# Patient Record
Sex: Female | Born: 1955 | Race: White | Hispanic: No | Marital: Married | Smoking: Never smoker
Health system: Southern US, Community
[De-identification: ages and names within clinical notes are randomized; demographics above are authoritative.]

## PROBLEM LIST (undated history)

## (undated) DIAGNOSIS — R7303 Prediabetes: Secondary | ICD-10-CM

## (undated) DIAGNOSIS — Z87442 Personal history of urinary calculi: Secondary | ICD-10-CM

## (undated) DIAGNOSIS — G4730 Sleep apnea, unspecified: Secondary | ICD-10-CM

## (undated) DIAGNOSIS — E785 Hyperlipidemia, unspecified: Secondary | ICD-10-CM

## (undated) DIAGNOSIS — Z9889 Other specified postprocedural states: Secondary | ICD-10-CM

## (undated) DIAGNOSIS — J302 Other seasonal allergic rhinitis: Secondary | ICD-10-CM

## (undated) DIAGNOSIS — M1990 Unspecified osteoarthritis, unspecified site: Secondary | ICD-10-CM

## (undated) DIAGNOSIS — G4733 Obstructive sleep apnea (adult) (pediatric): Secondary | ICD-10-CM

## (undated) DIAGNOSIS — Z789 Other specified health status: Secondary | ICD-10-CM

## (undated) DIAGNOSIS — R519 Headache, unspecified: Secondary | ICD-10-CM

## (undated) DIAGNOSIS — H9193 Unspecified hearing loss, bilateral: Secondary | ICD-10-CM

## (undated) DIAGNOSIS — H9190 Unspecified hearing loss, unspecified ear: Secondary | ICD-10-CM

## (undated) DIAGNOSIS — B0689 Other rubella complications: Secondary | ICD-10-CM

## (undated) DIAGNOSIS — Z973 Presence of spectacles and contact lenses: Secondary | ICD-10-CM

## (undated) DIAGNOSIS — J45909 Unspecified asthma, uncomplicated: Secondary | ICD-10-CM

## (undated) DIAGNOSIS — T8859XA Other complications of anesthesia, initial encounter: Secondary | ICD-10-CM

## (undated) DIAGNOSIS — F419 Anxiety disorder, unspecified: Secondary | ICD-10-CM

## (undated) DIAGNOSIS — R339 Retention of urine, unspecified: Secondary | ICD-10-CM

## (undated) DIAGNOSIS — J4520 Mild intermittent asthma, uncomplicated: Secondary | ICD-10-CM

## (undated) DIAGNOSIS — R112 Nausea with vomiting, unspecified: Secondary | ICD-10-CM

## (undated) DIAGNOSIS — M8580 Other specified disorders of bone density and structure, unspecified site: Secondary | ICD-10-CM

## (undated) DIAGNOSIS — Z9989 Dependence on other enabling machines and devices: Secondary | ICD-10-CM

## (undated) HISTORY — DX: Hyperlipidemia, unspecified: E78.5

## (undated) HISTORY — PX: OTHER SURGICAL HISTORY: SHX169

## (undated) HISTORY — PX: ABDOMINAL HYSTERECTOMY: SHX81

## (undated) HISTORY — DX: Unspecified osteoarthritis, unspecified site: M19.90

## (undated) HISTORY — DX: Other specified disorders of bone density and structure, unspecified site: M85.80

---

## 2013-12-13 ENCOUNTER — Ambulatory Visit (INDEPENDENT_AMBULATORY_CARE_PROVIDER_SITE_OTHER): Payer: BLUE CROSS/BLUE SHIELD | Admitting: Physician Assistant

## 2013-12-13 ENCOUNTER — Encounter: Payer: Self-pay | Admitting: Physician Assistant

## 2013-12-13 VITALS — BP 149/85 | HR 97 | Ht 65.0 in | Wt 173.0 lb

## 2013-12-13 DIAGNOSIS — R519 Headache, unspecified: Secondary | ICD-10-CM

## 2013-12-13 DIAGNOSIS — R7301 Impaired fasting glucose: Secondary | ICD-10-CM

## 2013-12-13 DIAGNOSIS — I10 Essential (primary) hypertension: Secondary | ICD-10-CM

## 2013-12-13 DIAGNOSIS — E785 Hyperlipidemia, unspecified: Secondary | ICD-10-CM | POA: Diagnosis not present

## 2013-12-13 DIAGNOSIS — M19049 Primary osteoarthritis, unspecified hand: Secondary | ICD-10-CM | POA: Diagnosis not present

## 2013-12-13 DIAGNOSIS — R062 Wheezing: Secondary | ICD-10-CM

## 2013-12-13 DIAGNOSIS — R51 Headache: Secondary | ICD-10-CM

## 2013-12-13 DIAGNOSIS — Z8709 Personal history of other diseases of the respiratory system: Secondary | ICD-10-CM

## 2013-12-13 DIAGNOSIS — Z131 Encounter for screening for diabetes mellitus: Secondary | ICD-10-CM

## 2013-12-13 DIAGNOSIS — Z1239 Encounter for other screening for malignant neoplasm of breast: Secondary | ICD-10-CM

## 2013-12-13 MED ORDER — MELOXICAM 7.5 MG PO TABS: 7.5000 mg | ORAL_TABLET | Freq: Every day | ORAL | Status: DC

## 2013-12-13 MED ORDER — ALBUTEROL SULFATE (2.5 MG/3ML) 0.083% IN NEBU
2.5000 mg | INHALATION_SOLUTION | Freq: Once | RESPIRATORY_TRACT | Status: AC
  Administered 2013-12-13: 2.5 mg via RESPIRATORY_TRACT

## 2013-12-13 MED ORDER — ALBUTEROL SULFATE HFA 108 (90 BASE) MCG/ACT IN AERS: 2.0000 | INHALATION_SPRAY | Freq: Four times a day (QID) | RESPIRATORY_TRACT | Status: DC | PRN

## 2013-12-13 NOTE — Patient Instructions (Addendum)
eXercise 150 minutes a week.   Zyrtec daily.  Consider Headache diary.    DASH Eating Plan DASH stands for "Dietary Approaches to Stop Hypertension." The DASH eating plan is a healthy eating plan that has been shown to reduce high blood pressure (hypertension). Additional health benefits may include reducing the risk of type 2 diabetes mellitus, heart disease, and stroke. The DASH eating plan may also help with weight loss. WHAT DO I NEED TO KNOW ABOUT THE DASH EATING PLAN? For the DASH eating plan, you will follow these general guidelines:  Choose foods with a percent daily value for sodium of less than 5% (as listed on the food label).  Use salt-free seasonings or herbs instead of table salt or sea salt.  Check with your health care provider or pharmacist before using salt substitutes.  Eat lower-sodium products, often labeled as "lower sodium" or "no salt added."  Eat fresh foods.  Eat more vegetables, fruits, and low-fat dairy products.  Choose whole grains. Look for the word "whole" as the first word in the ingredient list.  Choose fish and skinless chicken or [place] more often than red meat. Limit fish, poultry, and meat to 6 oz (170 g) each day.  Limit sweets, desserts, sugars, and sugary drinks.  Choose heart-healthy fats.  Limit cheese to 1 oz (28 g) per day.  Eat more home-cooked food and less restaurant, buffet, and fast food.  Limit fried foods.  Cook foods using methods other than frying.  Limit canned vegetables. If you do use them, rinse them well to decrease the sodium.  When eating at a restaurant, ask that your food be prepared with less salt, or no salt if possible. WHAT FOODS CAN I EAT? Seek help from a dietitian for individual calorie needs. Grains Whole grain or whole wheat bread. Brown rice. Whole grain or whole wheat pasta. Quinoa, bulgur, and whole grain cereals. Low-sodium cereals. Corn or whole wheat flour tortillas. Whole grain cornbread. Whole  grain crackers. Low-sodium crackers. Vegetables Fresh or frozen vegetables (raw, steamed, roasted, or grilled). Low-sodium or reduced-sodium tomato and vegetable juices. Low-sodium or reduced-sodium tomato sauce and paste. Low-sodium or reduced-sodium canned vegetables.  Fruits All fresh, canned (in natural juice), or frozen fruits. Meat and Other Protein Products Ground beef (85% or leaner), grass-fed beef, or beef trimmed of fat. Skinless chicken or [place]. Ground chicken or [place]. Pork trimmed of fat. All fish and seafood. Eggs. Dried beans, peas, or lentils. Unsalted nuts and seeds. Unsalted canned beans. Dairy Low-fat dairy products, such as skim or 1% milk, 2% or reduced-fat cheeses, low-fat ricotta or cottage cheese, or plain low-fat yogurt. Low-sodium or reduced-sodium cheeses. Fats and Oils Tub margarines without trans fats. Light or reduced-fat mayonnaise and salad dressings (reduced sodium). Avocado. Safflower, olive, or canola oils. Natural peanut or almond butter. Other Unsalted popcorn and pretzels. The items listed above may not be a complete list of recommended foods or beverages. Contact your dietitian for more options. WHAT FOODS ARE NOT RECOMMENDED? Grains White bread. White pasta. White rice. Refined cornbread. Bagels and croissants. Crackers that contain trans fat. Vegetables Creamed or fried vegetables. Vegetables in a cheese sauce. Regular canned vegetables. Regular canned tomato sauce and paste. Regular tomato and vegetable juices. Fruits Dried fruits. Canned fruit in light or heavy syrup. Fruit juice. Meat and Other Protein Products Fatty cuts of meat. Ribs, chicken wings, bacon, sausage, bologna, salami, chitterlings, fatback, hot dogs, bratwurst, and packaged luncheon meats. Salted nuts and seeds. Canned beans with  salt. Dairy Whole or 2% milk, cream, half-and-half, and cream cheese. Whole-fat or sweetened yogurt. Full-fat cheeses or blue cheese. Nondairy creamers  and whipped toppings. Processed cheese, cheese spreads, or cheese curds. Condiments Onion and garlic salt, seasoned salt, table salt, and sea salt. Canned and packaged gravies. Worcestershire sauce. Tartar sauce. Barbecue sauce. Teriyaki sauce. Soy sauce, including reduced sodium. Steak sauce. Fish sauce. Oyster sauce. Cocktail sauce. Horseradish. Ketchup and mustard. Meat flavorings and tenderizers. Bouillon cubes. Hot sauce. Tabasco sauce. Marinades. Taco seasonings. Relishes. Fats and Oils Butter, stick margarine, lard, shortening, ghee, and bacon fat. Coconut, palm kernel, or palm oils. Regular salad dressings. Other Pickles and olives. Salted popcorn and pretzels. The items listed above may not be a complete list of foods and beverages to avoid. Contact your dietitian for more information. WHERE CAN I FIND MORE INFORMATION? National Heart, Lung, and Blood Institute: travelstabloid.com Document Released: 05/05/2011 Document Revised: 05/21/2013 Document Reviewed: 03/20/2013 [facility] Patient Information 2015 [place], [place]. This information is not intended to replace advice given to you by your health care provider. Make sure you discuss any questions you have with your health care provider.

## 2013-12-16 ENCOUNTER — Encounter: Payer: Self-pay | Admitting: Physician Assistant

## 2013-12-16 DIAGNOSIS — I10 Essential (primary) hypertension: Secondary | ICD-10-CM | POA: Insufficient documentation

## 2013-12-16 DIAGNOSIS — Z8709 Personal history of other diseases of the respiratory system: Secondary | ICD-10-CM | POA: Insufficient documentation

## 2013-12-16 DIAGNOSIS — R51 Headache: Secondary | ICD-10-CM

## 2013-12-16 DIAGNOSIS — R519 Headache, unspecified: Secondary | ICD-10-CM | POA: Insufficient documentation

## 2013-12-16 DIAGNOSIS — E785 Hyperlipidemia, unspecified: Secondary | ICD-10-CM | POA: Insufficient documentation

## 2013-12-16 HISTORY — DX: Essential (primary) hypertension: I10

## 2013-12-16 NOTE — Progress Notes (Signed)
Subjective:    Patient ID: Misty [name], female    DOB: 1955-08-30, 58 y.o.   MRN: [id number]  HPI Pt is a 58 yo female who presents to the clinic to establish care.   .. Active Ambulatory Problems    Diagnosis Date Noted  . 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 Essential hypertension, benign 12/16/2013  . Hyperlipidemia 12/16/2013  . Frequent headaches 12/16/2013   Resolved Ambulatory Problems    Diagnosis Date Noted  . No Resolved Ambulatory Problems   Past Medical History  Diagnosis Date  . Arthritis    .Marland Kitchen Family History  Problem Relation Age of Onset  . Hyperlipidemia Mother   . Cancer Father     prostate  . Diabetes Father   . Cancer Maternal Grandfather     colon cancer  . Stroke Maternal Grandfather    .Marland Kitchen History   Social History  . Marital Status: Married    Spouse Name: N/A    Number of Children: N/A  . Years of Education: N/A   Occupational History  . Not on file.   Social History Main Topics  . Smoking status: Never Smoker   . Smokeless tobacco: Not on file  . Alcohol Use: Not on file  . Drug Use: Not on file  . Sexual Activity: Not on file   Other Topics Concern  . Not on file   Social History Narrative  . No narrative on file    Hyperlipdiemia- not taking medication for over a year. Wants to be reevaluated for need.   HTN- battling elevated BP. Does not want to start medication. Started having frequent HA's about 2-3 times a week. Responds to NSAIDs well. Denies any CP, palpitations, vision changes. No nausea or vomiting with HA's.   Wheezing/SOB- hx of asthma. Not had any treatment in many years. Feeling more short of breath and chest tightness with occasional wheezing. Taking aller clear OTC for allergies.   Arthritis in fingertips wants to know if can try something other than aleve. Aleve does help just hates taking bid.     Review of Systems  All other systems reviewed and are negative.      Objective:   Physical Exam   Constitutional: She is oriented to person, place, and time.  HENT:  Head: Normocephalic and atraumatic.  Right Ear: External ear normal.  Left Ear: External ear normal.  Nose: Nose normal.  Mouth/Throat: Oropharynx is clear and moist.  Eyes: Conjunctivae are normal. Right eye exhibits no discharge. Left eye exhibits no discharge.  Neck: Normal range of motion. Neck supple.  Cardiovascular: Normal rate, regular rhythm and normal heart sounds.   Pulmonary/Chest: Effort normal and breath sounds normal.  A few wheezes heard at base of left lung. Peak flow yellow zone. Pt reports wheezing but lungs sound good, no rhonchi or crackles.   Nebulizer given pt voices improvement. No wheezing heard. Peak flows green zone.   Musculoskeletal:  DIP joints enlargement with nodules bilateral hands.   Lymphadenopathy:    She has no cervical adenopathy.  Neurological: She is alert and oriented to person, place, and time.  Psychiatric: She has a normal mood and affect. Her behavior is normal.          Assessment & Plan:  Wheezing/SOB- peak flow in yellow but returned to green after nebulizer in office. Given albuterol inhaler to use as needed. Suggested to try zyrtec OTC since likely some allergies could be causing symptoms. If using albuterol regularly need  follow appt. Certainly could get formal spirometry to characterize and consider daily ICS. Follow up in 3 months.   Osteoarthritis of fingers- since using a lot of aleve. Stop aleve and try mobic daily/as needed. Certainly consider injections from Dr. Darene Lamer if continuing to be bothersome. Fish oil can also be helpful.   HtN- i did recheck bP and did come down some. pt does not want to start BP medication. Discussed that BP control is important. If she would like to try weight loss, DASH diet for next 3-6 months I am ok with that but if continues to be elevated I would suggest medication. Pt is in agreement.   Hyperlipidemia- pt not on simivastatin and  does not want to be. No recent labs. Will get fasting labs and make decision based on numbers and risk. Pt in agreement.   Frequent HA's- could be from elevated BP. Keep HA dairy. excedrin migraine at onset. Follow up if symptoms worsening, changing, or not improving.   Needs mammogram will order. Pt aware needs pap smear.

## 2013-12-18 ENCOUNTER — Other Ambulatory Visit: Payer: Self-pay | Admitting: Family Medicine

## 2013-12-18 LAB — COMPLETE METABOLIC PANEL WITH GFR
ALT: 35 U/L (ref 0–35)
AST: 23 U/L (ref 0–37)
Albumin: 4.9 g/dL (ref 3.5–5.2)
Alkaline Phosphatase: 87 U/L (ref 39–117)
BUN: 15 mg/dL (ref 6–23)
CO2: 26 mEq/L (ref 19–32)
Calcium: 10.2 mg/dL (ref 8.4–10.5)
Chloride: 102 mEq/L (ref 96–112)
Creat: 0.55 mg/dL (ref 0.50–1.10)
GFR, Est African American: >89 mL/min
GFR, Est Non African American: >89 mL/min
Glucose, Bld: 114 mg/dL — ABNORMAL HIGH (ref 70–99)
Potassium: 4.5 mEq/L (ref 3.5–5.3)
Sodium: 140 mEq/L (ref 135–145)
Total Bilirubin: 0.8 mg/dL (ref 0.2–1.2)
Total Protein: 7.4 g/dL (ref 6.0–8.3)

## 2013-12-18 LAB — LIPID PANEL
Cholesterol: 298 mg/dL — ABNORMAL HIGH (ref 0–200)
HDL: 51 mg/dL (ref >39)
LDL Cholesterol: 203 mg/dL — ABNORMAL HIGH (ref 0–99)
Total CHOL/HDL Ratio: 5.8 Ratio
Triglycerides: 218 mg/dL — ABNORMAL HIGH (ref <150)
VLDL: 44 mg/dL — ABNORMAL HIGH (ref 0–40)

## 2013-12-18 MED ORDER — SIMVASTATIN 40 MG PO TABS: 40.0000 mg | ORAL_TABLET | Freq: Every day | ORAL | Status: DC

## 2013-12-18 NOTE — Addendum Note (Signed)
Addended by: Beatris Ship L on: 12/18/2013 02:24 PM   Modules accepted: Orders

## 2013-12-27 ENCOUNTER — Encounter: Payer: Self-pay | Admitting: Physician Assistant

## 2013-12-27 DIAGNOSIS — R7303 Prediabetes: Secondary | ICD-10-CM | POA: Insufficient documentation

## 2013-12-27 LAB — HEMOGLOBIN A1C
Hgb A1c MFr Bld: 6 % — ABNORMAL HIGH (ref <5.7)
Mean Plasma Glucose: 126 mg/dL — ABNORMAL HIGH (ref <117)

## 2013-12-31 ENCOUNTER — Ambulatory Visit (INDEPENDENT_AMBULATORY_CARE_PROVIDER_SITE_OTHER): Payer: BLUE CROSS/BLUE SHIELD

## 2013-12-31 DIAGNOSIS — Z1231 Encounter for screening mammogram for malignant neoplasm of breast: Secondary | ICD-10-CM

## 2013-12-31 DIAGNOSIS — Z1239 Encounter for other screening for malignant neoplasm of breast: Secondary | ICD-10-CM

## 2014-03-06 ENCOUNTER — Encounter: Payer: Self-pay | Admitting: Physician Assistant

## 2014-03-06 ENCOUNTER — Ambulatory Visit (INDEPENDENT_AMBULATORY_CARE_PROVIDER_SITE_OTHER): Payer: BLUE CROSS/BLUE SHIELD | Admitting: Physician Assistant

## 2014-03-06 VITALS — BP 117/70 | HR 73 | Resp 16 | Wt 164.0 lb

## 2014-03-06 DIAGNOSIS — Z23 Encounter for immunization: Secondary | ICD-10-CM

## 2014-03-06 DIAGNOSIS — E785 Hyperlipidemia, unspecified: Secondary | ICD-10-CM

## 2014-03-06 DIAGNOSIS — I10 Essential (primary) hypertension: Secondary | ICD-10-CM | POA: Diagnosis not present

## 2014-03-06 DIAGNOSIS — R7309 Other abnormal glucose: Secondary | ICD-10-CM

## 2014-03-06 DIAGNOSIS — R7303 Prediabetes: Secondary | ICD-10-CM

## 2014-03-07 NOTE — Progress Notes (Signed)
   Subjective:    Patient ID: Misty [name], female    DOB: Dec 22, 1955, 58 y.o.   MRN: [id number]  HPI Pt presents to the clinic for 3 month follow up but seems she is early for 3 months.  She had HTN, pre-diabetes, hyperlipidemia.   HTN/Pre-diabetes- doing much better. Walking. Lost 9lbs. Has limited carbs and sugars. Feeling good.   hyperlipidemia restarted zocor. Low fat diet. Losing weight. Trying to correct with diet and exercise.    Review of Systems  All other systems reviewed and are negative.      Objective:   Physical Exam  Constitutional: She is oriented to person, place, and time. She appears well-developed and well-nourished.  HENT:  Head: Normocephalic and atraumatic.  Cardiovascular: Normal rate, regular rhythm and normal heart sounds.   Pulmonary/Chest: Effort normal and breath sounds normal.  Neurological: She is alert and oriented to person, place, and time.  Skin: Skin is dry.  Psychiatric: She has a normal mood and affect. Her behavior is normal.          Assessment & Plan:  Pre -diabetes- too early for A1C. Gave lab slip to have drawn after October 30th. i suspect will bet better with lifestyle adjustments.   Hyperlipidemia- will recheck. Given lab slip.   HTN- looking great today. Lifestyle worked.

## 2014-03-14 ENCOUNTER — Ambulatory Visit: Payer: Self-pay | Admitting: Physician Assistant

## 2014-03-15 ENCOUNTER — Other Ambulatory Visit: Payer: Self-pay | Admitting: Physician Assistant

## 2014-04-08 ENCOUNTER — Other Ambulatory Visit: Payer: Self-pay | Admitting: Family Medicine

## 2014-04-15 LAB — LIPID PANEL
Cholesterol: 162 mg/dL (ref 0–200)
HDL: 46 mg/dL (ref >39)
LDL Cholesterol: 85 mg/dL (ref 0–99)
Total CHOL/HDL Ratio: 3.5 Ratio
Triglycerides: 155 mg/dL — ABNORMAL HIGH (ref <150)
VLDL: 31 mg/dL (ref 0–40)

## 2014-04-15 LAB — HEMOGLOBIN A1C
Hgb A1c MFr Bld: 5.9 % — ABNORMAL HIGH (ref <5.7)
Mean Plasma Glucose: 123 mg/dL — ABNORMAL HIGH (ref <117)

## 2014-05-09 ENCOUNTER — Other Ambulatory Visit: Payer: Self-pay | Admitting: Family Medicine

## 2014-06-08 ENCOUNTER — Other Ambulatory Visit: Payer: Self-pay | Admitting: Physician Assistant

## 2014-08-30 ENCOUNTER — Other Ambulatory Visit: Payer: Self-pay | Admitting: Physician Assistant

## 2014-09-05 ENCOUNTER — Ambulatory Visit (INDEPENDENT_AMBULATORY_CARE_PROVIDER_SITE_OTHER): Payer: BLUE CROSS/BLUE SHIELD | Admitting: Physician Assistant

## 2014-09-05 ENCOUNTER — Encounter: Payer: Self-pay | Admitting: Physician Assistant

## 2014-09-05 VITALS — BP 122/80 | HR 84 | Wt 166.0 lb

## 2014-09-05 DIAGNOSIS — Z8709 Personal history of other diseases of the respiratory system: Secondary | ICD-10-CM

## 2014-09-05 DIAGNOSIS — M19049 Primary osteoarthritis, unspecified hand: Secondary | ICD-10-CM | POA: Diagnosis not present

## 2014-09-05 DIAGNOSIS — I10 Essential (primary) hypertension: Secondary | ICD-10-CM | POA: Diagnosis not present

## 2014-09-05 DIAGNOSIS — E785 Hyperlipidemia, unspecified: Secondary | ICD-10-CM

## 2014-09-05 MED ORDER — SIMVASTATIN 40 MG PO TABS: ORAL_TABLET | ORAL | Status: DC

## 2014-09-05 MED ORDER — ALBUTEROL SULFATE HFA 108 (90 BASE) MCG/ACT IN AERS: 2.0000 | INHALATION_SPRAY | Freq: Four times a day (QID) | RESPIRATORY_TRACT | Status: DC | PRN

## 2014-09-05 MED ORDER — MONTELUKAST SODIUM 10 MG PO TABS: 10.0000 mg | ORAL_TABLET | Freq: Every day | ORAL | Status: DC

## 2014-09-05 MED ORDER — IBUPROFEN-FAMOTIDINE 800-26.6 MG PO TABS: ORAL_TABLET | ORAL | Status: DC

## 2014-09-05 NOTE — Patient Instructions (Signed)
Try duexis to replace mobic since taking mobic daily.   Start singulair with zyrtec since allergies are really bad. Continue albuterol as needed. If not improving then follow up.   CPE in august/september.

## 2014-09-07 NOTE — Progress Notes (Signed)
   Subjective:    Patient ID: Misty [name], female    DOB: 10-05-1955, 59 y.o.   MRN: [id number]  HPI Pt presents to the clinic for medication refills.   HTN- doing well. No problems. Not on any medications.  No CP, palptiations, SOB, headaches.   Hyperlipidemia- needs refill. Checked LDL and not been quite a year. No problems with medication.   osteoarthritis on mobic. Taking daily. Some days feel like makes her belly feel uneasy and sometimes a little painful with some reflux. mobic helps with pain though.   Asthma- fairly well controlled. Albuterol as needed. Has been feeling a little tight lately and using albuterol a little more. Does not tolerate nasal sprays very well. Taking zyrtec.    Review of Systems  All other systems reviewed and are negative.      Objective:   Physical Exam  Constitutional: She is oriented to person, place, and time. She appears well-developed and well-nourished.  HENT:  Head: Normocephalic and atraumatic.  Cardiovascular: Normal rate, regular rhythm and normal heart sounds.   Pulmonary/Chest: Effort normal and breath sounds normal. She has no wheezes.  Neurological: She is alert and oriented to person, place, and time.  Skin: Skin is dry.  Psychiatric: She has a normal mood and affect. Her behavior is normal.          Assessment & Plan:  HTN- well controlled not on medication.   Hyperlipidemia- CPE in fall. Will check then. Refilled for 6 months.   Osteoarthritis- stop mobic could be making stomach upset. Samples of duexis were given along with prescription to try up to three times a day. Follow up as needed.   Asthma- refilled proair. Added singulair to zyrtec for allergy prevention of asthma. Follow up if still needed rescue inhaler on a regular basis.

## 2014-09-30 ENCOUNTER — Other Ambulatory Visit: Payer: Self-pay | Admitting: Physician Assistant

## 2014-09-30 ENCOUNTER — Encounter: Payer: Self-pay | Admitting: Physician Assistant

## 2014-09-30 NOTE — Telephone Encounter (Signed)
Received information from [facility] regarding insurance coverage on Duexis. Attempted to contact Pt and see if the Rx is helping, if so we will need to order the Ibuprofen and famotidine individually.   Pt was unavailable, left message regarding reason for call and provided callback information.

## 2015-02-26 ENCOUNTER — Other Ambulatory Visit: Payer: Self-pay | Admitting: Physician Assistant

## 2015-03-27 ENCOUNTER — Other Ambulatory Visit: Payer: Self-pay | Admitting: Physician Assistant

## 2015-09-09 ENCOUNTER — Encounter: Payer: Self-pay | Admitting: Physician Assistant

## 2015-09-09 ENCOUNTER — Other Ambulatory Visit (HOSPITAL_COMMUNITY)
Admission: RE | Admit: 2015-09-09 | Discharge: 2015-09-09 | Disposition: A | Discharge status: Home / Self Care | Payer: BLUE CROSS/BLUE SHIELD | Source: Ambulatory Visit | Attending: Physician Assistant | Admitting: Physician Assistant

## 2015-09-09 ENCOUNTER — Ambulatory Visit (INDEPENDENT_AMBULATORY_CARE_PROVIDER_SITE_OTHER): Payer: BLUE CROSS/BLUE SHIELD | Admitting: Physician Assistant

## 2015-09-09 VITALS — BP 134/70 | HR 75 | Ht 65.0 in | Wt 165.0 lb

## 2015-09-09 DIAGNOSIS — Z1159 Encounter for screening for other viral diseases: Secondary | ICD-10-CM | POA: Diagnosis not present

## 2015-09-09 DIAGNOSIS — N841 Polyp of cervix uteri: Secondary | ICD-10-CM

## 2015-09-09 DIAGNOSIS — Z0000 Encounter for general adult medical examination without abnormal findings: Secondary | ICD-10-CM | POA: Diagnosis not present

## 2015-09-09 DIAGNOSIS — M545 Low back pain, unspecified: Secondary | ICD-10-CM | POA: Insufficient documentation

## 2015-09-09 DIAGNOSIS — E785 Hyperlipidemia, unspecified: Secondary | ICD-10-CM | POA: Diagnosis not present

## 2015-09-09 DIAGNOSIS — Z131 Encounter for screening for diabetes mellitus: Secondary | ICD-10-CM

## 2015-09-09 DIAGNOSIS — Z01419 Encounter for gynecological examination (general) (routine) without abnormal findings: Secondary | ICD-10-CM | POA: Insufficient documentation

## 2015-09-09 DIAGNOSIS — Z1151 Encounter for screening for human papillomavirus (HPV): Secondary | ICD-10-CM | POA: Insufficient documentation

## 2015-09-09 DIAGNOSIS — Z1231 Encounter for screening mammogram for malignant neoplasm of breast: Secondary | ICD-10-CM

## 2015-09-09 DIAGNOSIS — Z114 Encounter for screening for human immunodeficiency virus [HIV]: Secondary | ICD-10-CM

## 2015-09-09 NOTE — Patient Instructions (Addendum)

## 2015-09-10 LAB — COMPLETE METABOLIC PANEL WITH GFR
ALT: 30 U/L — ABNORMAL HIGH (ref 6–29)
AST: 22 U/L (ref 10–35)
Albumin: 4.8 g/dL (ref 3.6–5.1)
Alkaline Phosphatase: 81 U/L (ref 33–130)
BUN: 11 mg/dL (ref 7–25)
CO2: 27 mmol/L (ref 20–31)
Calcium: 9.7 mg/dL (ref 8.6–10.4)
Chloride: 108 mmol/L (ref 98–110)
Creat: 0.61 mg/dL (ref 0.50–0.99)
GFR, Est African American: >89 mL/min (ref >=60)
GFR, Est Non African American: >89 mL/min (ref >=60)
Glucose, Bld: 108 mg/dL — ABNORMAL HIGH (ref 65–99)
Potassium: 4.3 mmol/L (ref 3.5–5.3)
Sodium: 143 mmol/L (ref 135–146)
Total Bilirubin: 0.8 mg/dL (ref 0.2–1.2)
Total Protein: 6.7 g/dL (ref 6.1–8.1)

## 2015-09-10 LAB — LIPID PANEL
Cholesterol: 165 mg/dL (ref 125–200)
HDL: 50 mg/dL (ref >=46)
LDL Cholesterol: 86 mg/dL (ref <130)
Total CHOL/HDL Ratio: 3.3 Ratio (ref <=5.0)
Triglycerides: 145 mg/dL (ref <150)
VLDL: 29 mg/dL (ref <30)

## 2015-09-10 LAB — HIV ANTIBODY (ROUTINE TESTING W REFLEX): HIV 1&2 Ab, 4th Generation: NONREACTIVE

## 2015-09-10 LAB — CYTOLOGY - PAP

## 2015-09-11 LAB — HEPATITIS C ANTIBODY: HCV Ab: NEGATIVE

## 2015-09-12 NOTE — Progress Notes (Signed)
  Subjective:     Misty [name] is a 60 y.o. female and is here for a comprehensive physical exam. The patient reports no problems.  Social History   Social History  . Marital Status: Married    Spouse Name: N/A  . Number of Children: N/A  . Years of Education: N/A   Occupational History  . Not on file.   Social History Main Topics  . Smoking status: Never Smoker   . Smokeless tobacco: Not on file  . Alcohol Use: Not on file  . Drug Use: Not on file  . Sexual Activity: Not on file   Other Topics Concern  . Not on file   Social History Narrative   Health Maintenance  Topic Date Due  . INFLUENZA VACCINE  12/29/2015  . MAMMOGRAM  01/01/2016  . PAP SMEAR  09/09/2018  . TETANUS/TDAP  05/30/2020  . COLONOSCOPY  04/29/2021  . ZOSTAVAX  Completed  . Hepatitis C Screening  Completed  . HIV Screening  Completed    The following portions of the patient's history were reviewed and updated as appropriate: allergies, current medications, past family history, past medical history, past social history, past surgical history and problem list.  Review of Systems A comprehensive review of systems was negative.   Objective:    BP 134/70 mmHg  Pulse 75  Ht 5\' 5"  (1.651 m)  Wt 165 lb (74.844 kg)  BMI 27.46 kg/m2 General appearance: alert, cooperative and appears stated age Head: Normocephalic, without obvious abnormality, atraumatic Eyes: conjunctivae/corneas clear. PERRL, EOM's intact. Fundi benign. Ears: normal TM's and external ear canals both ears Nose: Nares normal. Septum midline. Mucosa normal. No drainage or sinus tenderness. Throat: lips, mucosa, and tongue normal; teeth and gums normal Neck: no adenopathy, no carotid bruit, no JVD, supple, symmetrical, trachea midline and thyroid not enlarged, symmetric, no tenderness/mass/nodules Back: symmetric, no curvature. ROM normal. No CVA tenderness. Lungs: clear to auscultation bilaterally Heart: regular rate and rhythm, S1, S2  normal, no murmur, click, rub or gallop Abdomen: soft, non-tender; bowel sounds normal; no masses,  no organomegaly Pelvic: external genitalia normal, no adnexal masses or tenderness, no cervical motion tenderness, uterus normal size, shape, and consistency, vagina normal without discharge and 11 oclock polyp/cyst. Extremities: extremities normal, atraumatic, no cyanosis or edema Pulses: 2+ and symmetric Skin: Skin color, texture, turgor normal. No rashes or lesions Lymph nodes: Cervical, supraclavicular, and axillary nodes normal. Neurologic: Alert and oriented X 3, normal strength and tone. Normal symmetric reflexes. Normal coordination and gait    Assessment:    Healthy female exam.      Plan:  cpe- breast exam declined due to upcoming mammogram. Pap done today. STD testing declined. Hep C and HIV ordered. Pt on vitamin D and calcium. Screening labs ordered.   Cervix polyp- found on exam. Will send to gyn for further work up. Gave ho and discussed benign findings.   Hyperlipidemia- screening labs ordered.    See After Visit Summary for Counseling Recommendations

## 2015-09-13 ENCOUNTER — Other Ambulatory Visit: Payer: Self-pay | Admitting: Physician Assistant

## 2015-09-14 ENCOUNTER — Encounter: Payer: Self-pay | Admitting: Physician Assistant

## 2015-09-14 ENCOUNTER — Telehealth: Payer: Self-pay | Admitting: *Deleted

## 2015-09-14 DIAGNOSIS — R7301 Impaired fasting glucose: Secondary | ICD-10-CM

## 2015-09-14 NOTE — Telephone Encounter (Signed)
Lab ordered.

## 2015-09-15 LAB — HEMOGLOBIN A1C
Hgb A1c MFr Bld: 5.8 % — ABNORMAL HIGH (ref <5.7)
Mean Plasma Glucose: 120 mg/dL

## 2015-11-03 ENCOUNTER — Other Ambulatory Visit: Payer: Self-pay | Admitting: Physician Assistant

## 2016-03-27 ENCOUNTER — Other Ambulatory Visit: Payer: Self-pay | Admitting: Physician Assistant

## 2016-04-28 DIAGNOSIS — Z23 Encounter for immunization: Secondary | ICD-10-CM | POA: Diagnosis not present

## 2016-05-13 ENCOUNTER — Ambulatory Visit: Payer: BLUE CROSS/BLUE SHIELD | Admitting: Family Medicine

## 2016-05-17 ENCOUNTER — Ambulatory Visit (INDEPENDENT_AMBULATORY_CARE_PROVIDER_SITE_OTHER): Payer: BLUE CROSS/BLUE SHIELD | Admitting: Family Medicine

## 2016-05-17 ENCOUNTER — Ambulatory Visit (INDEPENDENT_AMBULATORY_CARE_PROVIDER_SITE_OTHER): Payer: BLUE CROSS/BLUE SHIELD

## 2016-05-17 VITALS — BP 146/72 | HR 72 | Wt 168.0 lb

## 2016-05-17 DIAGNOSIS — M79644 Pain in right finger(s): Secondary | ICD-10-CM | POA: Diagnosis not present

## 2016-05-17 DIAGNOSIS — M25741 Osteophyte, right hand: Secondary | ICD-10-CM

## 2016-05-17 DIAGNOSIS — M65321 Trigger finger, right index finger: Secondary | ICD-10-CM | POA: Diagnosis not present

## 2016-05-17 DIAGNOSIS — M19041 Primary osteoarthritis, right hand: Secondary | ICD-10-CM

## 2016-05-17 DIAGNOSIS — M6530 Trigger finger, unspecified finger: Secondary | ICD-10-CM | POA: Insufficient documentation

## 2016-05-17 NOTE — Progress Notes (Signed)
   Subjective:    I'm seeing this patient as a consultation for:  Iran Planas, PA-C   CC: right finger pain  HPI: Patient notes a several month history of pain in the right index finger. She denies any injury. She notes difficulty with flexion at the PIP and pain in the palmar MCP. Occasionally her finger gets stuck in the flexed position requiring her to pop it open. It is somewhat consistent with history of left trigger thumb. She has not had any treatment yet. She feels well otherwise with no fevers or chills.  Of note patient is hard of hearing and uses her hands to communicate. The finger pain does interfere with her quality of life.  Past medical history, Surgical history, Family history not pertinant except as noted below, Social history, Allergies, and medications have been entered into the medical record, reviewed, and no changes needed.   Review of Systems: No headache, visual changes, nausea, vomiting, diarrhea, constipation, dizziness, abdominal pain, skin rash, fevers, chills, night sweats, weight loss, swollen lymph nodes, body aches, joint swelling, muscle aches, chest pain, shortness of breath, mood changes, visual or auditory hallucinations.   Objective:    Vitals:   05/17/16 1114  BP: (!) 146/72  Pulse: 72   General: Well Developed, well nourished, and in no acute distress.  Neuro/Psych: Alert and oriented x3, extra-ocular muscles intact, able to move all 4 extremities, sensation grossly intact. Skin: Warm and dry, no rashes noted.  Respiratory: Not using accessory muscles, speaking in full sentences, trachea midline.  Cardiovascular: Pulses palpable, no extremity edema. Abdomen: Does not appear distended. MSK: Right index finger: Normal-appearing. Mildly tender palpation palmar MCP with palpable nodule. No triggering present today. Nontender otherwise. Intact flexion and extension strength.  Procedure: Real-time Ultrasound Guided Injection of right second MCP  trigger finger injection  Device: GE Logiq E  Images permanently stored and available for review in the ultrasound unit. Verbal informed consent obtained. Discussed risks and benefits of procedure. Warned about infection bleeding damage to structures skin hypopigmentation and fat atrophy among others. Patient expresses understanding and agreement Time-out conducted.  Noted no overlying erythema, induration, or other signs of local infection.  Skin prepped in a sterile fashion.  Local anesthesia: Topical Ethyl chloride.  With sterile technique and under real time ultrasound guidance: 1.5 mL of lidocaine and 5 mg of dexamethasone injected easily.  Completed without difficulty   Advised to call if fevers/chills, erythema, induration, drainage, or persistent bleeding.  Images permanently stored and available for review in the ultrasound unit.  Impression: Technically successful ultrasound guided injection.  Double Band-Aid splint applied  X-ray right hand shows DJD without any acute injury. Awaiting formal radiology review    Impression and Recommendations:    Assessment and Plan: 60 y.o. female with Right finger pain. Likely trigger finger. Injected today with double Band-Aid splint. Return in 4 weeks..   Discussed warning signs or symptoms. Please see discharge instructions. Patient expresses understanding.

## 2016-05-17 NOTE — Patient Instructions (Signed)
Thank you for coming in today. Continue the double bandaid splint for a few weeks.  Return for recheck in 4 weeks.   Trigger Finger Trigger finger (digital tendinitis and stenosing tenosynovitis) is a common disorder that causes an often painful catching of the fingers or thumb. It occurs as a clicking, snapping, or locking of a finger in the palm of the hand. This is caused by a problem with the tendons that flex or bend the fingers sliding smoothly through their sheaths. The condition may occur in any finger or a couple fingers at the same time.  The finger may lock with the finger curled or suddenly straighten out with a snap. This is more common in patients with rheumatoid arthritis and diabetes. Left untreated, the condition may get worse to the point where the finger becomes locked in flexion, like making a fist, or less commonly locked with the finger straightened out. CAUSES   Inflammation and scarring that lead to swelling around the tendon sheath.  Repeated or forceful movements.  Rheumatoid arthritis, an autoimmune disease that affects joints.  Gout.  Diabetes mellitus. SIGNS AND SYMPTOMS  Soreness and swelling of your finger.  A painful clicking or snapping as you bend and straighten your finger. DIAGNOSIS  Your health care provider will do a physical exam of your finger to diagnose trigger finger. TREATMENT   Splinting for 6-8 weeks may be helpful.  Nonsteroidal anti-inflammatory medicines (NSAIDs) can help to relieve the pain and inflammation.  Cortisone injections, along with splinting, may speed up recovery. Several injections may be required. Cortisone may give relief after one injection.  Surgery is another treatment that may be used if conservative treatments do not work. Surgery can be minor, without incisions (a cut does not have to be made), and can be done with a needle through the skin.  Other surgical choices involve an open procedure in which the surgeon  opens the hand through a small incision and cuts the pulley so the tendon can again slide smoothly. Your hand will still work fine. HOME CARE INSTRUCTIONS  Apply ice to the injured area, twice per day:  Put ice in a plastic bag.  Place a towel between your skin and the bag.  Leave the ice on for 20 minutes, 3-4 times a day.  Rest your hand often. MAKE SURE YOU:   Understand these instructions.  Will watch your condition.  Will get help right away if you are not doing well or get worse. This information is not intended to replace advice given to you by your health care provider. Make sure you discuss any questions you have with your health care provider. Document Released: 03/05/2004 Document Revised: 01/16/2013 Document Reviewed: 10/16/2012 Elsevier Interactive Patient Education  2017 [employer].

## 2016-06-14 ENCOUNTER — Ambulatory Visit (INDEPENDENT_AMBULATORY_CARE_PROVIDER_SITE_OTHER): Payer: BLUE CROSS/BLUE SHIELD | Admitting: Family Medicine

## 2016-06-14 ENCOUNTER — Encounter: Payer: Self-pay | Admitting: Family Medicine

## 2016-06-14 VITALS — BP 174/82 | HR 96 | Temp 98.2°F | Wt 166.0 lb

## 2016-06-14 DIAGNOSIS — M19049 Primary osteoarthritis, unspecified hand: Secondary | ICD-10-CM

## 2016-06-14 MED ORDER — DICLOFENAC SODIUM 1 % TD GEL: 2.0000 g | Freq: Four times a day (QID) | TRANSDERMAL | 11 refills | Status: DC

## 2016-06-14 NOTE — Progress Notes (Signed)
Misty [name] is a 61 y.o. female who presents to [place] today for follow-up finger stiffness. Patient was seen about a month ago for stiffness and triggering of her right index finger. She was treated with a trigger finger injection. She notes this has helped only a tiny little bit. She continues to experience stiffness and pain especially at the PIP. 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 She is unable to grip fully with her right hand.   Past Medical History:  Diagnosis Date  . Arthritis   . Hyperlipidemia    No past surgical history on file. Social History  Substance Use Topics  . Smoking status: Never Smoker  . Smokeless tobacco: Not on file  . Alcohol use Not on file     ROS:  As above   Medications: Current Outpatient Prescriptions  Medication Sig Dispense Refill  . B Complex-C (SUPER B COMPLEX PO) Take by mouth daily.    . Calcium Carbonate-Vitamin D (CALCIUM + D PO) Take 600 mg by mouth 2 (two) times daily.    . cetirizine (ZYRTEC) 10 MG tablet Take 10 mg by mouth daily. Generic-AllerTec from [employer]    . magnesium oxide (MAG-OX) 400 MG tablet Take 400 mg by mouth daily.    . Multiple Vitamin (MULTIVITAMIN) tablet Take 1 tablet by mouth daily.    . Omega-3 Fatty Acids (FISH OIL) 1200 MG CAPS Take by mouth daily.    Marland Kitchen PROAIR HFA 108 (90 Base) MCG/ACT inhaler INHALE 2 PUFFS INTO THE LUNGS EVERY 6 (SIX) HOURS AS NEEDED FOR WHEEZING OR SHORTNESS OF BREATH. 8.5 Inhaler 0  . simvastatin (ZOCOR) 40 MG tablet Take 1 tablet (40 mg total) by mouth daily at 6 PM. 90 tablet 1  . VITAMIN E PO Take by mouth daily.    . diclofenac sodium (VOLTAREN) 1 % GEL Apply 2 g topically 4 (four) times daily. To affected joint. 100 g 11   No current facility-administered medications for this visit.    No Known Allergies   Exam:  BP (!) 174/82   Pulse 96   Temp 98.2 F (36.8 C)   Wt 166 lb (75.3 kg)    BMI 27.62 kg/m  General: Well Developed, well nourished, and in no acute distress.  Neuro/Psych: Alert and oriented x3, extra-ocular muscles intact, able to move all 4 extremities, sensation grossly intact. Skin: Warm and dry, no rashes noted.  Respiratory: Not using accessory muscles, speaking in full sentences, trachea midline.  Cardiovascular: Pulses palpable, no extremity edema. Abdomen: Does not appear distended. MSK: Right hand normal-appearing. Nontender. Decreased flexion at index finger PIP. Normal MCP and DIP motion. No triggering present.  Study Result   CLINICAL DATA:  Right anterior metacarpal region pain.  EXAM: RIGHT HAND - COMPLETE 3+ VIEW  COMPARISON:  None in PACs  FINDINGS: The bones are subjectively adequately mineralized. There is no acute or healing fracture. There is moderate to severe osteoarthritic change of the DIP joints of the index and fifth fingers. There are milder osteoarthritic changes of the IP joints elsewhere. There is mild narrowing of the MCP joint spaces. There is mild narrowing of the first [facility] joint. The soft tissues of the metacarpal regions appear normal.  IMPRESSION: Osteoarthritic joint space narrowing and marginal osteophyte formation involving the interphalangeal joints principally. Milder joint space loss of the MCP and first CMC joints is also consistent with osteoarthritis. There is no acute bony abnormality.  Electronically Signed   By: David  Martinique M.D.   On: 05/17/2016 14:31   Result Notes       No results found for this or any previous visit (from the past 48 hour(s)). No results found.    Assessment and Plan: 61 y.o. female with  Right index finger pain and stiffness very likely DJD. I'm doubtful that there really was much of a component of trigger finger. We discussed options. Plan for a trial of hand physical therapy as well as diclofenac gel. If not better will consider PIP injection  Blood  pressure is a bit elevated today. Plan to recheck at the next visit.  This visit was conducted using an American sign language interpreter.    Orders Placed This Encounter  Procedures  . Ambulatory referral to Physical Therapy    Referral Priority:   Routine    Referral Type:   Physical Medicine    Referral Reason:   Specialty Services Required    Requested Specialty:   Physical Therapy    Number of Visits Requested:   1    Discussed warning signs or symptoms. Please see discharge instructions. Patient expresses understanding.

## 2016-06-14 NOTE — Patient Instructions (Signed)
Thank you for coming in today. Return in 4-6 weeks for follow up hand pain.  Use the diclofenac gel for pain 4 x daily.  Attend PT.

## 2016-06-20 ENCOUNTER — Telehealth: Payer: Self-pay | Admitting: *Deleted

## 2016-06-20 NOTE — Telephone Encounter (Signed)
PA submitted through covermymeds. prob will be denied

## 2016-06-23 NOTE — Telephone Encounter (Signed)
Your request has been denied  [id number] Name:ST: Branded NSAIDs and Diclofenac sodium 1% topical gel GF - ESI;Status:Denied;Appeal Information: Attention:ATTN: [place] [id number]. [id number];    Called patient and spoke to her through relay service and let her know cost is $30 and she can pay out of pocket since insurance won't cover.

## 2016-07-05 DIAGNOSIS — M25541 Pain in joints of right hand: Secondary | ICD-10-CM | POA: Diagnosis not present

## 2016-07-05 DIAGNOSIS — M19041 Primary osteoarthritis, right hand: Secondary | ICD-10-CM | POA: Diagnosis not present

## 2016-07-25 ENCOUNTER — Ambulatory Visit (INDEPENDENT_AMBULATORY_CARE_PROVIDER_SITE_OTHER): Payer: BLUE CROSS/BLUE SHIELD | Admitting: Family Medicine

## 2016-07-25 VITALS — BP 148/67 | HR 86 | Wt 168.0 lb

## 2016-07-25 DIAGNOSIS — M19049 Primary osteoarthritis, unspecified hand: Secondary | ICD-10-CM

## 2016-07-25 DIAGNOSIS — Z1239 Encounter for other screening for malignant neoplasm of breast: Secondary | ICD-10-CM

## 2016-07-25 DIAGNOSIS — Z1231 Encounter for screening mammogram for malignant neoplasm of breast: Secondary | ICD-10-CM | POA: Diagnosis not present

## 2016-07-25 DIAGNOSIS — M7602 Gluteal tendinitis, left hip: Secondary | ICD-10-CM

## 2016-07-25 DIAGNOSIS — M65321 Trigger finger, right index finger: Secondary | ICD-10-CM | POA: Diagnosis not present

## 2016-07-25 DIAGNOSIS — M7600 Gluteal tendinitis, unspecified hip: Secondary | ICD-10-CM | POA: Insufficient documentation

## 2016-07-25 HISTORY — DX: Encounter for other screening for malignant neoplasm of breast: Z12.39

## 2016-07-25 NOTE — Progress Notes (Signed)
Narita Urbach is a 61 y.o. female who presents to [place] today for right index finger pain. Patient has been seen several times for finger pain. She was thought to have trigger finger on December 19 and received a trigger finger injection which helped a little. She continued to experience pain and stiffness at the MCP PIP and DIP was thought to have DJD and was treated with diclofenac gel. She's been using diclofenac gel intermittently which helps some. She notes return of triggering and clicking. Symptoms are mild. She notes she had trouble tolerating the double banded splint because it was painful.  Additionally she notes acute onset of left low back pain for the last 5 days. Pain radiates to the lateral hip. Pain is worse with standing from a seated position and walking. She denies any pain radiating below the level of the knee weakness or numbness or bowel bladder dysfunction. No fevers or chills or injury.   Past Medical History:  Diagnosis Date  . Arthritis   . Hyperlipidemia    No past surgical history on file. Social History  Substance Use Topics  . Smoking status: Never Smoker  . Smokeless tobacco: Not on file  . Alcohol use Not on file     ROS:  As above   Medications: Current Outpatient Prescriptions  Medication Sig Dispense Refill  . B Complex-C (SUPER B COMPLEX PO) Take by mouth daily.    . Calcium Carbonate-Vitamin D (CALCIUM + D PO) Take 600 mg by mouth 2 (two) times daily.    . cetirizine (ZYRTEC) 10 MG tablet Take 10 mg by mouth daily. Generic-AllerTec from [employer]    . diclofenac sodium (VOLTAREN) 1 % GEL Apply 2 g topically 4 (four) times daily. To affected joint. 100 g 11  . magnesium oxide (MAG-OX) 400 MG tablet Take 400 mg by mouth daily.    . Multiple Vitamin (MULTIVITAMIN) tablet Take 1 tablet by mouth daily.    . Omega-3 Fatty Acids (FISH OIL) 1200 MG CAPS Take by mouth daily.    Marland Kitchen PROAIR HFA 108 (90 Base)  MCG/ACT inhaler INHALE 2 PUFFS INTO THE LUNGS EVERY 6 (SIX) HOURS AS NEEDED FOR WHEEZING OR SHORTNESS OF BREATH. 8.5 Inhaler 0  . simvastatin (ZOCOR) 40 MG tablet Take 1 tablet (40 mg total) by mouth daily at 6 PM. 90 tablet 1  . VITAMIN E PO Take by mouth daily.     No current facility-administered medications for this visit.    No Known Allergies   Exam:  BP (!) 148/67   Pulse 86   Wt 168 lb (76.2 kg)   BMI 27.96 kg/m  General: Well Developed, well nourished, and in no acute distress.  Neuro/Psych: Alert and oriented x3, extra-ocular muscles intact, able to move all 4 extremities, sensation grossly intact. Skin: Warm and dry, no rashes noted.  Respiratory: Not using accessory muscles, speaking in full sentences, trachea midline.  Cardiovascular: Pulses palpable, no extremity edema. Abdomen: Does not appear distended. MSK:  Right hand: Second digit. Slightly swollen MCP. Palpable triggering present at the MCP. Mildly tender MCP. Capillary refill and sensation are intact.  L-spine: Nontender to midline. Tender palpation left SI joint. Normal back motion. Left hip: Normal-appearing. Tender palpation greater trochanter and at the insertion of the gluteus medius and piriformis muscles. Hip abduction strength is 4/5 left. Normal gait.      No results found for this or any previous visit (from the past 48 hour(s)). No  results found.    Assessment and Plan: 61 y.o. female with  Right hand pain: I think patient has DJD and trigger finger. We discussed options. Plan for the equivalent of a double banded splint using Coban as well as diclofenac gel. Additionally we'll do some hand physical therapy and recheck. If not better we'll proceed with another injection.  Additionally patient has a lumbar sacral strain in the setting of hip abduction weakness likely due to insertional tendinopathy under the greater trochanter likely gluteus medius or piriformis. Treat this with physical  therapy as well.  Recheck in about a month.  Of note mammogram is ordered as it is currently due in health maintenance.    Orders Placed This Encounter  Procedures  . MM DIGITAL SCREENING BILATERAL    Standing Status:   Future    Standing Expiration Date:   09/24/2017    Order Specific Question:   Preferred imaging location?    Answer:   [facility]    Order Specific Question:   Reason for exam:    Answer:   screen breast cancer  . Ambulatory referral to Physical Therapy    Referral Priority:   Routine    Referral Type:   Physical Medicine    Referral Reason:   Specialty Services Required    Requested Specialty:   Physical Therapy    Number of Visits Requested:   1  . Ambulatory referral to Physical Therapy    Referral Priority:   Routine    Referral Type:   Physical Medicine    Referral Reason:   Specialty Services Required    Requested Specialty:   Physical Therapy    Number of Visits Requested:   1    Discussed warning signs or symptoms. Please see discharge instructions. Patient expresses understanding.

## 2016-07-25 NOTE — Patient Instructions (Signed)
Thank you for coming in today. Attend PT for your finger and you back.  Continue the gel.  Apply a bulky splint to the finger.  Use the TENS unit for the back.  Recheck in 4 weeks or so.  Return sooner if needed.   TENS UNIT: This is helpful for muscle pain and spasm.   Search and Purchase a TENS 7000 2nd edition at  www.tenspros.com or www.[place].com It should be less than $30.     TENS unit instructions: Do not shower or bathe with the unit on Turn the unit off before removing electrodes or batteries If the electrodes lose stickiness add a drop of water to the electrodes after they are disconnected from the unit and place on plastic sheet. If you continued to have difficulty, call the TENS unit company to purchase more electrodes. Do not apply lotion on the skin area prior to use. Make sure the skin is clean and dry as this will help prolong the life of the electrodes. After use, always check skin for unusual red areas, rash or other skin difficulties. If there are any skin problems, does not apply electrodes to the same area. Never remove the electrodes from the unit by pulling the wires. Do not use the TENS unit or electrodes other than as directed. Do not change electrode placement without consultating your therapist or physician. Keep 2 fingers with between each electrode. Wear time ratio is 2:1, on to off times.    For example on for 30 minutes off for 15 minutes and then on for 30 minutes off for 15 minutes

## 2016-07-26 ENCOUNTER — Encounter: Payer: Self-pay | Admitting: Family Medicine

## 2016-07-27 ENCOUNTER — Ambulatory Visit (INDEPENDENT_AMBULATORY_CARE_PROVIDER_SITE_OTHER): Payer: BLUE CROSS/BLUE SHIELD

## 2016-07-27 DIAGNOSIS — Z1239 Encounter for other screening for malignant neoplasm of breast: Secondary | ICD-10-CM

## 2016-07-27 DIAGNOSIS — Z1231 Encounter for screening mammogram for malignant neoplasm of breast: Secondary | ICD-10-CM

## 2016-08-05 DIAGNOSIS — M65321 Trigger finger, right index finger: Secondary | ICD-10-CM | POA: Diagnosis not present

## 2016-08-05 DIAGNOSIS — M25541 Pain in joints of right hand: Secondary | ICD-10-CM | POA: Diagnosis not present

## 2016-08-05 DIAGNOSIS — M25641 Stiffness of right hand, not elsewhere classified: Secondary | ICD-10-CM | POA: Diagnosis not present

## 2016-08-05 DIAGNOSIS — M6281 Muscle weakness (generalized): Secondary | ICD-10-CM | POA: Diagnosis not present

## 2016-08-10 DIAGNOSIS — M6281 Muscle weakness (generalized): Secondary | ICD-10-CM | POA: Diagnosis not present

## 2016-08-10 DIAGNOSIS — M65321 Trigger finger, right index finger: Secondary | ICD-10-CM | POA: Diagnosis not present

## 2016-08-10 DIAGNOSIS — M25541 Pain in joints of right hand: Secondary | ICD-10-CM | POA: Diagnosis not present

## 2016-08-10 DIAGNOSIS — M25641 Stiffness of right hand, not elsewhere classified: Secondary | ICD-10-CM | POA: Diagnosis not present

## 2016-08-12 DIAGNOSIS — M6281 Muscle weakness (generalized): Secondary | ICD-10-CM | POA: Diagnosis not present

## 2016-08-12 DIAGNOSIS — M25541 Pain in joints of right hand: Secondary | ICD-10-CM | POA: Diagnosis not present

## 2016-08-12 DIAGNOSIS — M25641 Stiffness of right hand, not elsewhere classified: Secondary | ICD-10-CM | POA: Diagnosis not present

## 2016-08-12 DIAGNOSIS — M65321 Trigger finger, right index finger: Secondary | ICD-10-CM | POA: Diagnosis not present

## 2016-08-18 DIAGNOSIS — M65321 Trigger finger, right index finger: Secondary | ICD-10-CM | POA: Diagnosis not present

## 2016-08-18 DIAGNOSIS — M6281 Muscle weakness (generalized): Secondary | ICD-10-CM | POA: Diagnosis not present

## 2016-08-18 DIAGNOSIS — M25641 Stiffness of right hand, not elsewhere classified: Secondary | ICD-10-CM | POA: Diagnosis not present

## 2016-08-18 DIAGNOSIS — M25541 Pain in joints of right hand: Secondary | ICD-10-CM | POA: Diagnosis not present

## 2016-08-22 ENCOUNTER — Ambulatory Visit: Payer: BLUE CROSS/BLUE SHIELD | Admitting: Family Medicine

## 2016-08-30 ENCOUNTER — Encounter: Payer: Self-pay | Admitting: Family Medicine

## 2016-09-14 ENCOUNTER — Other Ambulatory Visit: Payer: Self-pay | Admitting: Physician Assistant

## 2016-10-26 DIAGNOSIS — M65321 Trigger finger, right index finger: Secondary | ICD-10-CM | POA: Diagnosis not present

## 2016-10-26 DIAGNOSIS — M79641 Pain in right hand: Secondary | ICD-10-CM | POA: Diagnosis not present

## 2016-11-23 DIAGNOSIS — M65321 Trigger finger, right index finger: Secondary | ICD-10-CM | POA: Diagnosis not present

## 2016-11-23 DIAGNOSIS — M79641 Pain in right hand: Secondary | ICD-10-CM | POA: Diagnosis not present

## 2016-11-30 ENCOUNTER — Encounter: Payer: Self-pay | Admitting: Family Medicine

## 2016-12-05 ENCOUNTER — Ambulatory Visit (INDEPENDENT_AMBULATORY_CARE_PROVIDER_SITE_OTHER): Payer: BLUE CROSS/BLUE SHIELD | Admitting: Family Medicine

## 2016-12-05 ENCOUNTER — Encounter: Payer: Self-pay | Admitting: Family Medicine

## 2016-12-05 VITALS — BP 130/66 | HR 75 | Ht 64.25 in | Wt 168.0 lb

## 2016-12-05 DIAGNOSIS — Z0000 Encounter for general adult medical examination without abnormal findings: Secondary | ICD-10-CM

## 2016-12-05 DIAGNOSIS — E782 Mixed hyperlipidemia: Secondary | ICD-10-CM | POA: Diagnosis not present

## 2016-12-05 DIAGNOSIS — I10 Essential (primary) hypertension: Secondary | ICD-10-CM

## 2016-12-05 DIAGNOSIS — R5383 Other fatigue: Secondary | ICD-10-CM | POA: Diagnosis not present

## 2016-12-05 DIAGNOSIS — M899 Disorder of bone, unspecified: Secondary | ICD-10-CM

## 2016-12-05 DIAGNOSIS — R7303 Prediabetes: Secondary | ICD-10-CM | POA: Diagnosis not present

## 2016-12-05 DIAGNOSIS — Z8709 Personal history of other diseases of the respiratory system: Secondary | ICD-10-CM | POA: Diagnosis not present

## 2016-12-05 DIAGNOSIS — M949 Disorder of cartilage, unspecified: Secondary | ICD-10-CM

## 2016-12-05 DIAGNOSIS — Z780 Asymptomatic menopausal state: Secondary | ICD-10-CM

## 2016-12-05 DIAGNOSIS — M65321 Trigger finger, right index finger: Secondary | ICD-10-CM

## 2016-12-05 NOTE — Patient Instructions (Addendum)
Thank you for coming in today.  Please schedule a spirometry in the near future.  You should hear from [facility] soon about the sleep study.  Recheck as needed.  Go to the xray dept and schedule the bone density test.   Sleep Apnea Sleep apnea is a condition in which breathing pauses or becomes shallow during sleep. Episodes of sleep apnea usually last 10 seconds or longer, and they may occur as many as 20 times an hour. Sleep apnea disrupts your sleep and keeps your body from getting the rest that it needs. This condition can increase your risk of certain health problems, including:  Heart attack.  Stroke.  Obesity.  Diabetes.  Heart failure.  Irregular heartbeat.  There are three kinds of sleep apnea:  Obstructive sleep apnea. This kind is caused by a blocked or collapsed airway.  Central sleep apnea. This kind happens when the part of the brain that controls breathing does not send the correct signals to the muscles that control breathing.  Mixed sleep apnea. This is a combination of obstructive and central sleep apnea.  What are the causes? The most common cause of this condition is a collapsed or blocked airway. An airway can collapse or become blocked if:  Your throat muscles are abnormally relaxed.  Your tongue and tonsils are larger than normal.  You are overweight.  Your airway is smaller than normal.  What increases the risk? This condition is more likely to develop in people who:  Are overweight.  Smoke.  Have a smaller than normal airway.  Are elderly.  Are female.  Drink alcohol.  Take sedatives or tranquilizers.  Have a family history of sleep apnea.  What are the signs or symptoms? Symptoms of this condition include:  Trouble staying asleep.  Daytime sleepiness and tiredness.  Irritability.  Loud snoring.  Morning headaches.  Trouble concentrating.  Forgetfulness.  Decreased interest in sex.  Unexplained sleepiness.  Mood  swings.  Personality changes.  Feelings of depression.  Waking up often during the night to urinate.  Dry mouth.  Sore throat.  How is this diagnosed? This condition may be diagnosed with:  A medical history.  A physical exam.  A series of tests that are done while you are sleeping (sleep study). These tests are usually done in a sleep lab, but they may also be done at home.  How is this treated? Treatment for this condition aims to restore normal breathing and to ease symptoms during sleep. It may involve managing health issues that can affect breathing, such as high blood pressure or obesity. Treatment may include:  Sleeping on your side.  Using a decongestant if you have nasal congestion.  Avoiding the use of depressants, including alcohol, sedatives, and narcotics.  Losing weight if you are overweight.  Making changes to your diet.  Quitting smoking.  Using a device to open your airway while you sleep, such as: ? An oral appliance. This is a custom-made mouthpiece that shifts your lower jaw forward. ? A continuous positive airway pressure (CPAP) device. This device delivers oxygen to your airway through a mask. ? A nasal expiratory positive airway pressure (EPAP) device. This device has valves that you put into each nostril. ? A bi-level positive airway pressure (BPAP) device. This device delivers oxygen to your airway through a mask.  Surgery if other treatments do not work. During surgery, excess tissue is removed to create a wider airway.  It is important to get treatment for sleep  apnea. Without treatment, this condition can lead to:  High blood pressure.  Coronary artery disease.  (Men) An inability to achieve or maintain an erection (impotence).  Reduced thinking abilities.  Follow these instructions at home:  Make any lifestyle changes that your health care provider recommends.  Eat a healthy, well-balanced diet.  Take over-the-counter and  prescription medicines only as told by your health care provider.  Avoid using depressants, including alcohol, sedatives, and narcotics.  Take steps to lose weight if you are overweight.  If you were given a device to open your airway while you sleep, use it only as told by your health care provider.  Do not use any tobacco products, such as cigarettes, chewing tobacco, and e-cigarettes. If you need help quitting, ask your health care provider.  Keep all follow-up visits as told by your health care provider. This is important. Contact a health care provider if:  The device that you received to open your airway during sleep is uncomfortable or does not seem to be working.  Your symptoms do not improve.  Your symptoms get worse. Get help right away if:  You develop chest pain.  You develop shortness of breath.  You develop discomfort in your back, arms, or stomach.  You have trouble speaking.  You have weakness on one side of your body.  You have drooping in your face. These symptoms may represent a serious problem that is an emergency. Do not wait to see if the symptoms will go away. Get medical help right away. Call your local emergency services (911 in the [place] Do not drive yourself to the hospital. This information is not intended to replace advice given to you by your health care provider. Make sure you discuss any questions you have with your health care provider. Document Released: 05/06/2002 Document Revised: 01/10/2016 Document Reviewed: 02/23/2015 Elsevier Interactive Patient Education  2018 [place].   Seborrheic Keratosis Seborrheic keratosis is a common, noncancerous (benign) skin growth. This condition causes waxy, rough, tan, brown, or black spots to appear on the skin. These skin growths can be flat or raised. What are the causes? The cause of this condition is not known. What increases the risk? This condition is more likely to develop in:  People who  have a family history of seborrheic keratosis.  People who are 11 or older.  People who are pregnant.  People who have had estrogen replacement therapy.  What are the signs or symptoms? This condition often occurs on the face, chest, shoulders, back, or other areas. These growths:  Are usually painless, but may become irritated and itchy.  Can be yellow, brown, black, or other colors.  Are slightly raised or have a flat surface.  Are sometimes rough or wart-like in texture.  Are often waxy on the surface.  Are round or oval-shaped.  Sometimes look like they are "stuck on."  Often occur in groups, but may occur as a single growth.  How is this diagnosed? This condition is diagnosed with a medical history and physical exam. A sample of the growth may be tested (skin biopsy). You may need to see a skin specialist (dermatologist). How is this treated? Treatment is not usually needed for this condition, unless the growths are irritated or are often bleeding. You may also choose to have the growths removed if you do not like their appearance. Most commonly, these growths are treated with a procedure in which liquid nitrogen is applied to "freeze" off the growth (cryosurgery).  They may also be burned off with electricity or cut off. Follow these instructions at home:  Watch your growth for any changes.  Keep all follow-up visits as told by your health care provider. This is important.  Do not scratch or pick at the growth or growths. This can cause them to become irritated or infected. Contact a health care provider if:  You suddenly have many new growths.  Your growth bleeds, itches, or hurts.  Your growth suddenly becomes larger or changes color. This information is not intended to replace advice given to you by your health care provider. Make sure you discuss any questions you have with your health care provider. Document Released: 06/18/2010 Document Revised: 10/22/2015  Document Reviewed: 10/01/2014 Elsevier Interactive Patient Education  2017 [employer].

## 2016-12-05 NOTE — Progress Notes (Signed)
Misty [name] is a 61 y.o. female who presents to [place]: [place] today for well adult visit. Patient has several issues she wishes to discuss. She notes that she is quite fatigued over the last several months. She notes that she snores and is worried that she may have sleep apnea. She notes that she has been told that she sometimes stop breathing at night. She denies feeling too hot or too cold.  She also notes that she has a history of asthma as a child and sometimes experiences shortness of breath and wheezing. She notes that when she uses her albuterol it sometimes helps. No chest pain or palpitations present.   Past Medical History:  Diagnosis Date  . Arthritis   . Hyperlipidemia    No past surgical history on file. Social History  Substance Use Topics  . Smoking status: Never Smoker  . Smokeless tobacco: Never Used  . Alcohol use Not on file   family history includes Cancer in her father and maternal grandfather; Diabetes in her father; Hyperlipidemia in her mother; Stroke in her maternal grandfather.  ROS as above:  Medications: Current Outpatient Prescriptions  Medication Sig Dispense Refill  . B Complex-C (SUPER B COMPLEX PO) Take by mouth daily.    . Calcium Carbonate-Vitamin D (CALCIUM + D PO) Take 600 mg by mouth 2 (two) times daily.    . cetirizine (ZYRTEC) 10 MG tablet Take 10 mg by mouth daily. Generic-AllerTec from [employer]    . diclofenac sodium (VOLTAREN) 1 % GEL Apply 2 g topically 4 (four) times daily. To affected joint. 100 g 11  . magnesium oxide (MAG-OX) 400 MG tablet Take 400 mg by mouth daily.    . Multiple Vitamin (MULTIVITAMIN) tablet Take 1 tablet by mouth daily.    . Omega-3 Fatty Acids (FISH OIL) 1200 MG CAPS Take by mouth daily.    Marland Kitchen PROAIR HFA 108 (90 Base) MCG/ACT inhaler INHALE 2 PUFFS INTO THE LUNGS EVERY 6 (SIX) HOURS AS NEEDED FOR  WHEEZING OR SHORTNESS OF BREATH. 8.5 Inhaler 0  . simvastatin (ZOCOR) 40 MG tablet Take 1 tablet (40 mg total) by mouth daily at 6 PM. Needs appointment. 90 tablet 0  . VITAMIN E PO Take by mouth daily.     No current facility-administered medications for this visit.    No Known Allergies  Health Maintenance Health Maintenance  Topic Date Due  . INFLUENZA VACCINE  12/28/2016  . MAMMOGRAM  07/27/2018  . PAP SMEAR  09/09/2018  . TETANUS/TDAP  05/30/2020  . COLONOSCOPY  04/29/2021  . Hepatitis C Screening  Completed  . HIV Screening  Completed     Exam:  BP 130/66   Pulse 75   Ht 5' 4.25" (1.632 m)   Wt 168 lb (76.2 kg)   SpO2 96%   BMI 28.61 kg/m  Gen: Well NAD HEENT: EOMI,  MMM no goiter Lungs: Normal work of breathing. CTABL Heart: RRR no MRG Abd: NABS, Soft. Nondistended, Nontender Exts: Brisk capillary refill, warm and well perfused.    No results found for this or any previous visit (from the past 72 hour(s)). No results found.    Assessment and Plan: 61 y.o. female with  Well adult visit. Doing reasonably well. We'll obtain a sleep study to evaluate for potential sleep apnea. Additionally we'll check basic fasting labs listed below Lastly will update bone density screening this patient is postmenopausal.  Follow-up in the near  future for spirometry to evaluate shortness of breath.   Orders Placed This Encounter  Procedures  . DG Bone Density    Standing Status:   Future    Standing Expiration Date:   02/05/2018    Order Specific Question:   Reason for exam:    Answer:   eval bone density    Order Specific Question:   Preferred imaging location?    Answer:   [facility]  . CBC  . COMPLETE METABOLIC PANEL WITH GFR  . Lipid Panel w/reflex Direct LDL  . TSH  . VITAMIN D 25 Hydroxy (Vit-D Deficiency, Fractures)  . Hemoglobin A1c  . Home sleep test    Standing Status:   Future    Standing Expiration Date:   12/05/2017    Scheduling  Instructions:     Pt is hard of hearing. Use video phone or call husband at [phone]    Order Specific Question:   Where should this test be performed:    Answer:   [facility]   No orders of the defined types were placed in this encounter.    Discussed warning signs or symptoms. Please see discharge instructions. Patient expresses understanding.

## 2016-12-07 ENCOUNTER — Encounter: Payer: Self-pay | Admitting: Family Medicine

## 2016-12-07 ENCOUNTER — Ambulatory Visit (INDEPENDENT_AMBULATORY_CARE_PROVIDER_SITE_OTHER): Payer: BLUE CROSS/BLUE SHIELD

## 2016-12-07 ENCOUNTER — Ambulatory Visit (INDEPENDENT_AMBULATORY_CARE_PROVIDER_SITE_OTHER): Payer: BLUE CROSS/BLUE SHIELD | Admitting: Family Medicine

## 2016-12-07 VITALS — BP 129/70 | HR 70 | Wt 169.0 lb

## 2016-12-07 DIAGNOSIS — M85852 Other specified disorders of bone density and structure, left thigh: Secondary | ICD-10-CM | POA: Diagnosis not present

## 2016-12-07 DIAGNOSIS — R0602 Shortness of breath: Secondary | ICD-10-CM | POA: Diagnosis not present

## 2016-12-07 DIAGNOSIS — M949 Disorder of cartilage, unspecified: Principal | ICD-10-CM

## 2016-12-07 DIAGNOSIS — M8580 Other specified disorders of bone density and structure, unspecified site: Secondary | ICD-10-CM | POA: Insufficient documentation

## 2016-12-07 DIAGNOSIS — M899 Disorder of bone, unspecified: Secondary | ICD-10-CM

## 2016-12-07 DIAGNOSIS — Z780 Asymptomatic menopausal state: Secondary | ICD-10-CM

## 2016-12-07 DIAGNOSIS — M8589 Other specified disorders of bone density and structure, multiple sites: Secondary | ICD-10-CM | POA: Diagnosis not present

## 2016-12-07 HISTORY — DX: Other specified disorders of bone density and structure, unspecified site: M85.80

## 2016-12-07 LAB — CBC
HCT: 40.4 % (ref 35.0–45.0)
Hemoglobin: 13.4 g/dL (ref 11.7–15.5)
MCH: 28.7 pg (ref 27.0–33.0)
MCHC: 33.2 g/dL (ref 32.0–36.0)
MCV: 86.5 fL (ref 80.0–100.0)
MPV: 11.2 fL (ref 7.5–12.5)
Platelets: 171 10*3/uL (ref 140–400)
RBC: 4.67 MIL/uL (ref 3.80–5.10)
RDW: 13.1 % (ref 11.0–15.0)
WBC: 4.5 10*3/uL (ref 3.8–10.8)

## 2016-12-07 LAB — PULMONARY FUNCTION TEST

## 2016-12-07 LAB — LIPID PANEL W/REFLEX DIRECT LDL
Cholesterol: 186 mg/dL (ref <200)
HDL: 58 mg/dL (ref >50)
LDL-Cholesterol: 105 mg/dL — ABNORMAL HIGH
Non-HDL Cholesterol (Calc): 128 mg/dL (ref <130)
Total CHOL/HDL Ratio: 3.2 Ratio (ref <5.0)
Triglycerides: 136 mg/dL (ref <150)

## 2016-12-07 LAB — COMPLETE METABOLIC PANEL WITH GFR
ALT: 34 U/L — ABNORMAL HIGH (ref 6–29)
AST: 21 U/L (ref 10–35)
Albumin: 4.7 g/dL (ref 3.6–5.1)
Alkaline Phosphatase: 80 U/L (ref 33–130)
BUN: 14 mg/dL (ref 7–25)
CO2: 25 mmol/L (ref 20–31)
Calcium: 9.6 mg/dL (ref 8.6–10.4)
Chloride: 105 mmol/L (ref 98–110)
Creat: 0.64 mg/dL (ref 0.50–0.99)
GFR, Est African American: >89 mL/min (ref >=60)
GFR, Est Non African American: >89 mL/min (ref >=60)
Glucose, Bld: 110 mg/dL — ABNORMAL HIGH (ref 65–99)
Potassium: 4.1 mmol/L (ref 3.5–5.3)
Sodium: 140 mmol/L (ref 135–146)
Total Bilirubin: 0.9 mg/dL (ref 0.2–1.2)
Total Protein: 6.8 g/dL (ref 6.1–8.1)

## 2016-12-07 LAB — TSH: TSH: 1.67 mIU/L

## 2016-12-07 MED ORDER — FLUTICASONE PROPIONATE 50 MCG/ACT NA SUSP: 2.0000 | Freq: Every day | NASAL | 12 refills | Status: DC

## 2016-12-07 MED ORDER — ALBUTEROL SULFATE (2.5 MG/3ML) 0.083% IN NEBU
2.5000 mg | INHALATION_SOLUTION | Freq: Once | RESPIRATORY_TRACT | Status: AC
  Administered 2016-12-07: 2.5 mg via RESPIRATORY_TRACT

## 2016-12-07 MED ORDER — UMECLIDINIUM-VILANTEROL 62.5-25 MCG/INH IN AEPB: 1.0000 | INHALATION_SPRAY | Freq: Every day | RESPIRATORY_TRACT | 12 refills | Status: DC

## 2016-12-07 NOTE — Progress Notes (Signed)
Misty [name] is a 61 y.o. female who presents to [facility]: [place] today for follow-up breathing. Patient notes chronic mild dyspnea especially with exertion. She's had a workup which so far has been not very helpful. She has been using albuterol intermittently which do help her symptoms. She notes a history of secondhand smoke exposure as a child. She does not currently smoke. She denies significant wheezing currently.   Past Medical History:  Diagnosis Date  . Arthritis   . Hyperlipidemia    No past surgical history on file. Social History  Substance Use Topics  . Smoking status: Never Smoker  . Smokeless tobacco: Never Used  . Alcohol use Not on file   family history includes Cancer in her father and maternal grandfather; Diabetes in her father; Hyperlipidemia in her mother; Stroke in her maternal grandfather.  ROS as above:  Medications: Current Outpatient Prescriptions  Medication Sig Dispense Refill  . B Complex-C (SUPER B COMPLEX PO) Take by mouth daily.    . Calcium Carbonate-Vitamin D (CALCIUM + D PO) Take 600 mg by mouth 2 (two) times daily.    . cetirizine (ZYRTEC) 10 MG tablet Take 10 mg by mouth daily. Generic-AllerTec from [facility]    . diclofenac sodium (VOLTAREN) 1 % GEL Apply 2 g topically 4 (four) times daily. To affected joint. 100 g 11  . magnesium oxide (MAG-OX) 400 MG tablet Take 400 mg by mouth daily.    . Multiple Vitamin (MULTIVITAMIN) tablet Take 1 tablet by mouth daily.    . Omega-3 Fatty Acids (FISH OIL) 1200 MG CAPS Take by mouth daily.    Marland Kitchen PROAIR HFA 108 (90 Base) MCG/ACT inhaler INHALE 2 PUFFS INTO THE LUNGS EVERY 6 (SIX) HOURS AS NEEDED FOR WHEEZING OR SHORTNESS OF BREATH. 8.5 Inhaler 0  . simvastatin (ZOCOR) 40 MG tablet Take 1 tablet (40 mg total) by mouth daily at 6 PM. Needs appointment. 90 tablet 0  . VITAMIN E PO Take by mouth  daily.    . fluticasone (FLONASE) 50 MCG/ACT nasal spray Place 2 sprays into both nostrils daily. 16 g 12  . umeclidinium-vilanterol (ANORO ELLIPTA) 62.5-25 MCG/INH AEPB Inhale 1 puff into the lungs daily. 60 each 12   No current facility-administered medications for this visit.    No Known Allergies  Health Maintenance Health Maintenance  Topic Date Due  . INFLUENZA VACCINE  12/28/2016  . MAMMOGRAM  07/27/2018  . PAP SMEAR  09/09/2018  . TETANUS/TDAP  05/30/2020  . COLONOSCOPY  04/29/2021  . Hepatitis C Screening  Completed  . HIV Screening  Completed     Exam:  BP 129/70   Pulse 70   Wt 169 lb (76.7 kg)   SpO2 95%   BMI 28.78 kg/m  Gen: Well NAD HEENT: EOMI,  MMM Lungs: Normal work of breathing. CTABL Heart: RRR no MRG Abd: NABS, Soft. Nondistended, Nontender Exts: Brisk capillary refill, warm and well perfused.            Assessment and Plan: 61 y.o. female with shortness of breath. No significant reversibility with nebulizer treatment. FEV1 as depressed at 83% predicted. I think this is probably COPD type. Plan for trial of LABA/LAMA therapy with Anoro. If this works well and will continue the medication. If no benefit will recheck sooner.   No orders of the defined types were placed in this encounter.  Meds ordered this encounter  Medications  . albuterol (PROVENTIL) (2.5  MG/3ML) 0.083% nebulizer solution 2.5 mg  . fluticasone (FLONASE) 50 MCG/ACT nasal spray    Sig: Place 2 sprays into both nostrils daily.    Dispense:  16 g    Refill:  12  . umeclidinium-vilanterol (ANORO ELLIPTA) 62.5-25 MCG/INH AEPB    Sig: Inhale 1 puff into the lungs daily.    Dispense:  60 each    Refill:  12     Discussed warning signs or symptoms. Please see discharge instructions. Patient expresses understanding.

## 2016-12-07 NOTE — Progress Notes (Signed)
Pt here for Spirometry testing for SOB. Pt was able to do pre and post testing w/no complications. Pt will discuss results of test with Dr. Georgina Snell.Audelia Hives [place]

## 2016-12-07 NOTE — Patient Instructions (Addendum)
Thank you for coming in today. I am concerned about lung function.  Use anoro daily.  USe flonase.  Recheck with me or Jade in 6 months or sooner if needed.   Umeclidinium; Vilanterol inhalation powder What is this medicine? UMECLIDINIUM; VILANTEROL (ue MEK li DIN ee um; vye LAN ter ol) inhalation is a combination of two medicines that decrease inflammation and help to open up the airways of your lungs. It is for chronic obstructive pulmonary disease (COPD), including chronic bronchitis or emphysema. Do NOT use for asthma or an acute asthma attack. Do NOT use for a COPD attack. This medicine may be used for other purposes; ask your health care provider or pharmacist if you have questions. COMMON BRAND NAME(S): ANORO ELLIPTA What should I tell my health care provider before I take this medicine? They need to know if you have any of these conditions: -bladder problems or difficulty passing urine -diabetes -glaucoma -heart disease or irregular heartbeat -high blood pressure -kidney disease -pheochromocytoma -prostate disease -seizures -thyroid disease -an unusual or allergic reaction to umeclidinium, vilanterol, lactose, milk proteins, other medicines, foods, dyes, or preservatives -pregnant or trying to get pregnant -breast-feeding How should I use this medicine? This medicine is inhaled through the mouth. It is used once per day. Follow the directions on the prescription label. Do not use a spacer device with this inhaler. Take your medicine at regular intervals. Do not take your medicine more often than directed. Do not stop taking except on your doctor's advice. Make sure that you are using your inhaler correctly. Ask you doctor or health care provider if you have any questions. A special MedGuide will be given to you by the pharmacist with each prescription and refill. Be sure to read this information carefully each time. Talk to your pediatrician regarding the use of this medicine in  children. Special care may be needed. Overdosage: If you think you have taken too much of this medicine contact a poison control center or emergency room at once. NOTE: This medicine is only for you. Do not share this medicine with others. What if I miss a dose? If you miss a dose, use it as soon as you can. If it is almost time for your next dose, use only that dose and continue with your regular schedule. Do not use double or extra doses. What may interact with this medicine? Do not take this medicine with any of the following medications: -cisapride -dofetilide -dronedarone -MAOIs like Carbex, Eldepryl, Marplan, Nardil, and Parnate -pimozide -thioridazine -ziprasidone This medicine may also interact with the following medications: -antihistamines for allergy -antiviral medicines for HIV or AIDS -atropine -beta-blockers like metoprolol and propranolol -certain medicines for bladder problems like oxybutynin, tolterodine -certain medicines for depression, anxiety, or psychotic disturbances -certain medicines for Parkinson's disease like benztropine, trihexyphenidyl -certain medicines for stomach problems like dicyclomine, hyoscyamine -certain medicines for travel sickness like scopolamine -diuretics -ipratropium -medicines for colds -medicines for fungal infections like ketoconazole and itraconazole -other medicines for breathing problems -other medicines that prolong the QT interval (cause an abnormal heart rhythm) -tiotropium This list may not describe all possible interactions. Give your health care provider a list of all the medicines, herbs, non-prescription drugs, or dietary supplements you use. Also tell them if you smoke, drink alcohol, or use illegal drugs. Some items may interact with your medicine. What should I watch for while using this medicine? Visit your doctor or health care professional for regular checkups. Tell your doctor or health care professional  if your  symptoms do not get better. If your symptoms get worse or if you need your short-acting inhalers more often, call your doctor right away. Do not use this medicine more than once every 24 hours. What side effects may I notice from receiving this medicine? Side effects that you should report to your doctor or health care professional as soon as possible: -allergic reactions like skin rash or hives, swelling of the face, lips, or tongue -breathing problems right after inhaling your medicine -changes in vision -chest pain -eye pain -fast, irregular heartbeat -feeling faint or lightheaded, falls -fever or chills -nausea, vomiting -trouble passing urine or change in the amount of urine Side effects that usually do not require medical attention (report to your doctor or health care professional if they continue or are bothersome): -constipation -cough -diarrhea -headache -muscle cramps -nervousness -sore throat -tremor This list may not describe all possible side effects. Call your doctor for medical advice about side effects. You may report side effects to FDA at 1-800-FDA-1088. Where should I keep my medicine? Keep out of the reach of children. Store at room temperature between 15 and 30 degrees C (59 and 86 degrees F). Store in a dry place away from direct heat or sunlight. Throw away 6 weeks after you remove the inhaler from the foil tray, or after the dose indicator reads 0, whichever comes first. Throw away any unopened packages after the expiration date. NOTE: This sheet is a summary. It may not cover all possible information. If you have questions about this medicine, talk to your doctor, pharmacist, or health care provider.  2018 Elsevier/Gold Standard (2016-04-18 13:44:47)  Chronic Obstructive Pulmonary Disease Chronic obstructive pulmonary disease (COPD) is a common lung condition in which airflow from the lungs is limited. COPD is a general term that can be used to describe many  different lung problems that limit airflow, including both chronic bronchitis and emphysema. If you have COPD, your lung function will probably never return to normal, but there are measures you can take to improve lung function and make yourself feel better. What are the causes?  Smoking (common).  Exposure to secondhand smoke.  Genetic problems.  Chronic inflammatory lung diseases or recurrent infections. What are the signs or symptoms?  Shortness of breath, especially with physical activity.  Deep, persistent (chronic) cough with a large amount of thick mucus.  Wheezing.  Rapid breaths (tachypnea).  Gray or bluish discoloration (cyanosis) of the skin, especially in your fingers, toes, or lips.  Fatigue.  Weight loss.  Frequent infections or episodes when breathing symptoms become much worse (exacerbations).  Chest tightness. How is this diagnosed? Your health care provider will take a medical history and perform a physical examination to diagnose COPD. Additional tests for COPD may include:  Lung (pulmonary) function tests.  Chest X-ray.  CT scan.  Blood tests.  How is this treated? Treatment for COPD may include:  Inhaler and nebulizer medicines. These help manage the symptoms of COPD and make your breathing more comfortable.  Supplemental oxygen. Supplemental oxygen is only helpful if you have a low oxygen level in your blood.  Exercise and physical activity. These are beneficial for nearly all people with COPD.  Lung surgery or transplant.  Nutrition therapy to gain weight, if you are underweight.  Pulmonary rehabilitation. This may involve working with a team of health care providers and specialists, such as respiratory, occupational, and physical therapists.  Follow these instructions at home:  Take all medicines (inhaled  or pills) as directed by your health care provider.  Avoid over-the-counter medicines or cough syrups that dry up your airway  (such as antihistamines) and slow down the elimination of secretions unless instructed otherwise by your health care provider.  If you are a smoker, the most important thing that you can do is stop smoking. Continuing to smoke will cause further lung damage and breathing trouble. Ask your health care provider for help with quitting smoking. He or she can direct you to community resources or hospitals that provide support.  Avoid exposure to irritants such as smoke, chemicals, and fumes that aggravate your breathing.  Use oxygen therapy and pulmonary rehabilitation if directed by your health care provider. If you require home oxygen therapy, ask your health care provider whether you should purchase a pulse oximeter to measure your oxygen level at home.  Avoid contact with individuals who have a contagious illness.  Avoid extreme temperature and humidity changes.  Eat healthy foods. Eating smaller, more frequent meals and resting before meals may help you maintain your strength.  Stay active, but balance activity with periods of rest. Exercise and physical activity will help you maintain your ability to do things you want to do.  Preventing infection and hospitalization is very important when you have COPD. Make sure to receive all the vaccines your health care provider recommends, especially the pneumococcal and influenza vaccines. Ask your health care provider whether you need a pneumonia vaccine.  Learn and use relaxation techniques to manage stress.  Learn and use controlled breathing techniques as directed by your health care provider. Controlled breathing techniques include: 1. Pursed lip breathing. Start by breathing in (inhaling) through your nose for 1 second. Then, purse your lips as if you were going to whistle and breathe out (exhale) through the pursed lips for 2 seconds. 2. Diaphragmatic breathing. Start by putting one hand on your abdomen just above your waist. Inhale slowly  through your nose. The hand on your abdomen should move out. Then purse your lips and exhale slowly. You should be able to feel the hand on your abdomen moving in as you exhale.  Learn and use controlled coughing to clear mucus from your lungs. Controlled coughing is a series of short, progressive coughs. The steps of controlled coughing are: 1. Lean your head slightly forward. 2. Breathe in deeply using diaphragmatic breathing. 3. Try to hold your breath for 3 seconds. 4. Keep your mouth slightly open while coughing twice. 5. Spit any mucus out into a tissue. 6. Rest and repeat the steps once or twice as needed. Contact a health care provider if:  You are coughing up more mucus than usual.  There is a change in the color or thickness of your mucus.  Your breathing is more labored than usual.  Your breathing is faster than usual. Get help right away if:  You have shortness of breath while you are resting.  You have shortness of breath that prevents you from: ? Being able to talk. ? Performing your usual physical activities.  You have chest pain lasting longer than 5 minutes.  Your skin color is more cyanotic than usual.  You measure low oxygen saturations for longer than 5 minutes with a pulse oximeter. This information is not intended to replace advice given to you by your health care provider. Make sure you discuss any questions you have with your health care provider. Document Released: 02/23/2005 Document Revised: 10/22/2015 Document Reviewed: 01/10/2013 Elsevier Interactive Patient Education  2017  [employer].

## 2016-12-08 LAB — HEMOGLOBIN A1C
Hgb A1c MFr Bld: 5.8 % — ABNORMAL HIGH (ref <5.7)
Mean Plasma Glucose: 120 mg/dL

## 2016-12-08 LAB — VITAMIN D 25 HYDROXY (VIT D DEFICIENCY, FRACTURES): Vit D, 25-Hydroxy: 38 ng/mL (ref 30–100)

## 2016-12-08 MED ORDER — SIMVASTATIN 40 MG PO TABS: 40.0000 mg | ORAL_TABLET | Freq: Every day | ORAL | 3 refills | Status: DC

## 2016-12-08 NOTE — Addendum Note (Signed)
Addended by: Gregor Hams on: 12/08/2016 06:35 AM   Modules accepted: Orders

## 2016-12-09 ENCOUNTER — Encounter: Payer: Self-pay | Admitting: Family Medicine

## 2016-12-27 ENCOUNTER — Ambulatory Visit (HOSPITAL_BASED_OUTPATIENT_CLINIC_OR_DEPARTMENT_OTHER): Payer: BLUE CROSS/BLUE SHIELD | Attending: Family Medicine | Admitting: Internal Medicine

## 2016-12-27 DIAGNOSIS — R5383 Other fatigue: Secondary | ICD-10-CM | POA: Diagnosis not present

## 2016-12-27 DIAGNOSIS — R0683 Snoring: Secondary | ICD-10-CM | POA: Diagnosis not present

## 2016-12-27 DIAGNOSIS — G4733 Obstructive sleep apnea (adult) (pediatric): Secondary | ICD-10-CM | POA: Insufficient documentation

## 2016-12-29 ENCOUNTER — Other Ambulatory Visit (HOSPITAL_BASED_OUTPATIENT_CLINIC_OR_DEPARTMENT_OTHER): Payer: Self-pay

## 2016-12-29 DIAGNOSIS — R5383 Other fatigue: Secondary | ICD-10-CM

## 2016-12-31 DIAGNOSIS — R5383 Other fatigue: Secondary | ICD-10-CM | POA: Diagnosis not present

## 2016-12-31 NOTE — Procedures (Signed)
   Patient Name: Misty [name], Misty [name] Date: 12/27/2016 Gender: Female D.O.B: 08/22/1955 Age (years): 61 Referring Provider: Gregor Hams Height (inches): 17 Interpreting Physician: Baird Lyons MD, ABSM Weight (lbs): 166 RPSGT: Jacolyn Reedy BMI: 28 MRN: [id number] Neck Size: 14.50 CLINICAL INFORMATION Sleep Study Type: unattended HST  Indication for sleep study: Fatigue  Epworth Sleepiness Score: 7  SLEEP STUDY TECHNIQUE A multi-channel overnight portable sleep study was performed. The channels recorded were: nasal airflow, thoracic respiratory movement, and oxygen saturation with a pulse oximetry. Snoring was also monitored.  MEDICATIONS Patient self administered medications include: none reported.  SLEEP ARCHITECTURE Patient was studied for 392.4 minutes. The sleep efficiency was 100.0 % and the patient was supine for 71.6%. The arousal index was 0.0 per hour.  RESPIRATORY PARAMETERS The overall AHI was 15.6 per hour, with a central apnea index of 0.0 per hour.  The oxygen nadir was 83% during sleep.  CARDIAC DATA Mean heart rate during sleep was 65.3 bpm.  IMPRESSIONS - Moderate obstructive sleep apnea occurred during this study (AHI = 15.6/h). - No significant central sleep apnea occurred during this study (CAI = 0.0/h). - Moderate oxygen desaturation was noted during this study (Min O2 = 83%, Mean 92%). - Patient snored .  DIAGNOSIS - Obstructive Sleep Apnea (327.23 [G47.33 ICD-10])  RECOMMENDATIONS - CPAP is usually initial therapy of choice. Other options would be based on clinical judgment. - Sleep hygiene should be reviewed to assess factors that may improve sleep quality. - Weight management and regular exercise should be initiated or continued.  [Electronically signed] 12/31/2016 11:43 AM  Baird Lyons MD, ABSM Diplomate, American Board of Sleep Medicine   NPI: [id number]  [place], American Board of Sleep  Medicine  ELECTRONICALLY SIGNED ON:  12/31/2016, 11:36 AM [place] PH: [phone]   FX: [phone] [place]

## 2017-01-02 ENCOUNTER — Telehealth: Payer: Self-pay | Admitting: Family Medicine

## 2017-01-02 DIAGNOSIS — G4733 Obstructive sleep apnea (adult) (pediatric): Secondary | ICD-10-CM | POA: Insufficient documentation

## 2017-01-02 MED ORDER — AMBULATORY NON FORMULARY MEDICATION: 0 refills | Status: DC

## 2017-01-02 NOTE — Telephone Encounter (Signed)
Sleep Study shows Sleep Apnea/.  We will start CPAP.  Pt should be called about Sleep Study results and scheduled a follow up visit with me 4 weeks after starting CPAP.  Follow sooner if lot of questions.

## 2017-01-02 NOTE — Telephone Encounter (Signed)
Message left on vm through relay service

## 2017-01-03 NOTE — Telephone Encounter (Signed)
Patient came in the office yesterday with her husband and was notified of recommendations

## 2017-01-04 ENCOUNTER — Telehealth: Payer: Self-pay | Admitting: Family Medicine

## 2017-01-06 NOTE — Telephone Encounter (Signed)
Note opened in error.

## 2017-01-11 ENCOUNTER — Encounter: Payer: Self-pay | Admitting: Family Medicine

## 2017-01-11 DIAGNOSIS — R0600 Dyspnea, unspecified: Secondary | ICD-10-CM

## 2017-01-13 ENCOUNTER — Other Ambulatory Visit: Payer: Self-pay

## 2017-01-13 MED ORDER — AMBULATORY NON FORMULARY MEDICATION: 0 refills | Status: DC

## 2017-01-16 ENCOUNTER — Telehealth: Payer: Self-pay

## 2017-01-16 DIAGNOSIS — R0602 Shortness of breath: Secondary | ICD-10-CM | POA: Diagnosis not present

## 2017-01-16 DIAGNOSIS — G4733 Obstructive sleep apnea (adult) (pediatric): Secondary | ICD-10-CM | POA: Diagnosis not present

## 2017-01-16 NOTE — Telephone Encounter (Signed)
CPAP order, sleep study, demographics, insurance, and OV notes faxed to [employer] at [phone].

## 2017-02-03 ENCOUNTER — Telehealth: Payer: Self-pay | Admitting: Family Medicine

## 2017-02-03 NOTE — Telephone Encounter (Signed)
Note from CPAP company stating not able to reach office.  Please follow up.

## 2017-02-09 NOTE — Telephone Encounter (Signed)
Left message asking if patient received a call from the CPAP company and if she could call us back.

## 2017-02-16 DIAGNOSIS — G4733 Obstructive sleep apnea (adult) (pediatric): Secondary | ICD-10-CM | POA: Diagnosis not present

## 2017-02-16 DIAGNOSIS — R0602 Shortness of breath: Secondary | ICD-10-CM | POA: Diagnosis not present

## 2017-02-28 ENCOUNTER — Encounter: Payer: Self-pay | Admitting: Family Medicine

## 2017-03-02 ENCOUNTER — Ambulatory Visit (INDEPENDENT_AMBULATORY_CARE_PROVIDER_SITE_OTHER): Payer: BLUE CROSS/BLUE SHIELD | Admitting: Family Medicine

## 2017-03-02 ENCOUNTER — Encounter: Payer: Self-pay | Admitting: Family Medicine

## 2017-03-02 VITALS — BP 150/77 | HR 73 | Ht 65.0 in | Wt 173.0 lb

## 2017-03-02 DIAGNOSIS — Z23 Encounter for immunization: Secondary | ICD-10-CM

## 2017-03-02 DIAGNOSIS — G4733 Obstructive sleep apnea (adult) (pediatric): Secondary | ICD-10-CM

## 2017-03-02 MED ORDER — AMBULATORY NON FORMULARY MEDICATION: 0 refills | Status: DC

## 2017-03-02 NOTE — Patient Instructions (Addendum)
Thank you for coming in today. We will see about CPAP settings and likely change the settings based on how you have been doing over the last 30 days.   We will try contacting the [facility] about your issues.

## 2017-03-03 NOTE — Progress Notes (Signed)
Kathia Covington is a 61 y.o. female who presents to [facility]: [place] today for sleep apnea. Madeliene has been getting started on CPAP due to sleep apnea. She's been using the CPAP machine now for over a month and notes that it's working pretty well. She notes that she is using it over 90% of the time per her sleep report. She finds the mass to be a bit obnoxious and is currently getting nasal pillows. She also notes that the settings of the machine tender fluctuate and she finds this somewhat obnoxious.   Past Medical History:  Diagnosis Date  . Arthritis   . Hyperlipidemia   . Osteopenia determined by x-ray 12/07/2016   DEXA scan July 2018   No past surgical history on file. Social History  Substance Use Topics  . Smoking status: Never Smoker  . Smokeless tobacco: Never Used  . Alcohol use Not on file   family history includes Cancer in her father and maternal grandfather; Diabetes in her father; Hyperlipidemia in her mother; Stroke in her maternal grandfather.  ROS as above:  Medications: Current Outpatient Prescriptions  Medication Sig Dispense Refill  . AMBULATORY NON FORMULARY MEDICATION Continuous positive airway pressure (CPAP) machine auto-titrate from 4-20 cm of H2O pressure, with all supplemental supplies as needed. 1 each 0  . B Complex-C (SUPER B COMPLEX PO) Take by mouth daily.    . Calcium Carbonate-Vitamin D (CALCIUM + D PO) Take 600 mg by mouth 2 (two) times daily.    . diclofenac sodium (VOLTAREN) 1 % GEL Apply 2 g topically 4 (four) times daily. To affected joint. 100 g 11  . fluticasone (FLONASE) 50 MCG/ACT nasal spray Place 2 sprays into both nostrils daily. 16 g 12  . magnesium oxide (MAG-OX) 400 MG tablet Take 400 mg by mouth daily.    . Multiple Vitamin (MULTIVITAMIN) tablet Take 1 tablet by mouth daily.    . Omega-3 Fatty Acids (FISH OIL) 1200 MG CAPS  Take by mouth daily.    Marland Kitchen PROAIR HFA 108 (90 Base) MCG/ACT inhaler INHALE 2 PUFFS INTO THE LUNGS EVERY 6 (SIX) HOURS AS NEEDED FOR WHEEZING OR SHORTNESS OF BREATH. 8.5 Inhaler 0  . simvastatin (ZOCOR) 40 MG tablet Take 1 tablet (40 mg total) by mouth daily at 6 PM. 90 tablet 3  . VITAMIN E PO Take by mouth daily.    . AMBULATORY NON FORMULARY MEDICATION Please send 30 day download from CPAP attn Dr Georgina Snell [phone]. Send to [facility] 1 each 0   No current facility-administered medications for this visit.    No Known Allergies  Health Maintenance Health Maintenance  Topic Date Due  . MAMMOGRAM  07/27/2018  . PAP SMEAR  09/09/2018  . TETANUS/TDAP  05/30/2020  . COLONOSCOPY  04/29/2021  . INFLUENZA VACCINE  Completed  . Hepatitis C Screening  Completed  . HIV Screening  Completed     Exam:  BP (!) 150/77   Pulse 73   Ht 5\' 5"  (1.651 m)   Wt 173 lb (78.5 kg)   BMI 28.79 kg/m  Gen: Well NAD HEENT: EOMI,  MMM Lungs: Normal work of breathing. CTABL Heart: RRR no MRG Abd: NABS, Soft. Nondistended, Nontender Exts: Brisk capillary refill, warm and well perfused.    No results found for this or any previous visit (from the past 72 hour(s)). No results found.    Assessment and Plan: 61 y.o. female with Sleep apnea seems  to be doing okay. We'll get a 30 day download from [facility] and change settings to the 95%tile pressure with a 15 min ramp.  Change mask to nasal pillows.  Transition care back to pcp now.   Follow-up PCP for hypertension.  Flu vaccine given.   ASL interpreter used   Orders Placed This Encounter  Procedures  . Flu Vaccine QUAD 6+ mos PF IM (Fluarix Quad PF)   Meds ordered this encounter  Medications  . AMBULATORY NON FORMULARY MEDICATION    Sig: Please send 30 day download from CPAP attn Dr Georgina Snell [phone]. Send to [facility]    Dispense:  1 each    Refill:  0     Discussed warning signs or symptoms. Please see discharge instructions. Patient expresses  understanding.  I spent 25 minutes with this patient, greater than 50% was face-to-face time counseling regarding CPAP settings and options.

## 2017-03-08 ENCOUNTER — Encounter: Payer: Self-pay | Admitting: Physician Assistant

## 2017-03-18 DIAGNOSIS — G4733 Obstructive sleep apnea (adult) (pediatric): Secondary | ICD-10-CM | POA: Diagnosis not present

## 2017-03-18 DIAGNOSIS — R0602 Shortness of breath: Secondary | ICD-10-CM | POA: Diagnosis not present

## 2017-03-27 ENCOUNTER — Ambulatory Visit (HOSPITAL_BASED_OUTPATIENT_CLINIC_OR_DEPARTMENT_OTHER)
Admission: RE | Admit: 2017-03-27 | Discharge: 2017-03-27 | Disposition: A | Discharge status: Home / Self Care | Payer: BLUE CROSS/BLUE SHIELD | Source: Ambulatory Visit | Attending: Pulmonary Disease | Admitting: Pulmonary Disease

## 2017-03-27 ENCOUNTER — Ambulatory Visit (INDEPENDENT_AMBULATORY_CARE_PROVIDER_SITE_OTHER): Payer: BLUE CROSS/BLUE SHIELD | Admitting: Pulmonary Disease

## 2017-03-27 ENCOUNTER — Encounter: Payer: Self-pay | Admitting: Pulmonary Disease

## 2017-03-27 VITALS — BP 139/84 | HR 77 | Ht 65.0 in | Wt 166.0 lb

## 2017-03-27 DIAGNOSIS — R12 Heartburn: Secondary | ICD-10-CM

## 2017-03-27 DIAGNOSIS — R918 Other nonspecific abnormal finding of lung field: Secondary | ICD-10-CM | POA: Diagnosis not present

## 2017-03-27 DIAGNOSIS — R0602 Shortness of breath: Secondary | ICD-10-CM | POA: Diagnosis not present

## 2017-03-27 DIAGNOSIS — I517 Cardiomegaly: Secondary | ICD-10-CM | POA: Diagnosis not present

## 2017-03-27 NOTE — Patient Instructions (Signed)
Shortness of breath: We will check a chest x-ray We will check a pulmonary function test We will monitor your oxygen while you are walking today in the office  Chest burning: I think this may be heartburn: Take Pepcid over-the-counter twice a day no matter how you feel Read through the information sheet we gave you  We will see you back in 1-2 weeks to go over these results

## 2017-03-27 NOTE — Progress Notes (Signed)
Subjective:    Patient ID: Misty [name], female    DOB: 12-24-1955, 61 y.o.   MRN: [id number]  Synopsis: Referred in 2018 for Dyspnea She was diagnosed with sleep apnea in 2017, started CPAP 12/2016  HPI Chief Complaint  Patient presents with  . Advice Only    Referred by Michiel Cowboy for dyspnea.     She was seen and examined today with the assistance of a sign language interpreter.  Misty [name] is here to see me for shortness of breath. 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 > she din't have this problem in [place]    She denies coughing.  She says that she exercises using her elliptical machine and she can go about 20-30 minutes.  She denies any sort of chest tightness or wheezing while she is doing this.  Her symptoms started after she moved to [place].  She's never had heart burn, but she doesn't know if that's the problem.  Dyspnea doesn't occur around the time she eats.  Sometimes it occurs when she is outside.    No family history of lung problems except a niece that had severe asthma.   Past Medical History:  Diagnosis Date  . Arthritis   . Hyperlipidemia   . Osteopenia determined by x-ray 12/07/2016   DEXA scan July  2018     Family History  Problem Relation Age of Onset  . Hyperlipidemia Mother   . Cancer Father        prostate  . Diabetes Father   . Cancer Maternal Grandfather        colon cancer  . Stroke Maternal Grandfather      Social History   Social History  . Marital status: Married    Spouse name: N/A  . Number of children: N/A  . Years of education: N/A   Occupational History  . Not on file.   Social History Main Topics  . Smoking status: Never Smoker  . Smokeless tobacco: Never Used  . Alcohol use Not on file  . Drug use: Unknown  . Sexual activity: Not on file   Other Topics Concern  . Not on file   Social History Narrative  . No narrative on file     No Known Allergies   Outpatient Medications Prior to Visit  Medication Sig Dispense Refill  . AMBULATORY NON FORMULARY MEDICATION Continuous positive airway pressure (CPAP) machine auto-titrate from 4-20 cm of H2O pressure, with all supplemental supplies as needed. 1 each 0  . AMBULATORY NON FORMULARY MEDICATION Please send 30 day download from CPAP attn Dr Georgina Snell [phone].  Send to [facility] 1 each 0  . B Complex-C (SUPER B COMPLEX PO) Take by mouth daily.    . Calcium Carbonate-Vitamin D (CALCIUM + D PO) Take 600 mg by mouth 2 (two) times daily.    . diclofenac sodium (VOLTAREN) 1 % GEL Apply 2 g topically 4 (four) times daily. To affected joint. 100 g 11  . fluticasone (FLONASE) 50 MCG/ACT nasal spray Place 2 sprays into both nostrils daily. 16 g 12  . magnesium oxide (MAG-OX) 400 MG tablet Take 400 mg by mouth daily.    . Multiple Vitamin (MULTIVITAMIN) tablet Take 1 tablet by mouth daily.    . Omega-3 Fatty Acids (FISH OIL) 1200 MG CAPS Take by mouth daily.    Marland Kitchen PROAIR HFA 108 (90 Base) MCG/ACT inhaler INHALE 2 PUFFS INTO THE LUNGS EVERY 6 (SIX) HOURS AS NEEDED FOR WHEEZING OR SHORTNESS OF BREATH. 8.5 Inhaler 0  . simvastatin (ZOCOR) 40 MG tablet Take 1 tablet (40 mg total) by mouth daily at 6 PM. 90 tablet 3  .  VITAMIN E PO Take by mouth daily.     No facility-administered medications prior to visit.       Review of Systems  Constitutional: Negative for fever and unexpected weight change.  HENT: Positive for sore throat. Negative for congestion, dental problem, ear pain, nosebleeds, postnasal drip, rhinorrhea, sinus pressure, sneezing and trouble swallowing.   Eyes: Negative for redness and itching.  Respiratory: Positive for cough, chest tightness and shortness of breath. Negative for wheezing.   Cardiovascular: Negative for palpitations and leg swelling.  Gastrointestinal: Negative for nausea and vomiting.  Genitourinary: Negative for dysuria.  Musculoskeletal: Negative for joint swelling.  Skin: Negative for rash.  Neurological: Negative for headaches.  Hematological: Does not bruise/bleed easily.  Psychiatric/Behavioral: Negative for dysphoric mood. The patient is not nervous/anxious.        Objective:   Physical Exam Vitals:   03/27/17 1607  BP: 139/84  Pulse: 77  SpO2: 97%  Weight: 166 lb (75.3 kg)  Height: 5\' 5"  (1.651 m)   Gen: well appearing, no acute distress HENT: NCAT, OP clear, neck supple without masses Eyes: PERRL, EOMi Lymph: no cervical lymphadenopathy PULM: CTA B CV: RRR, no mgr, no JVD GI: BS+, soft, nontender, no hsm Derm: no rash or skin breakdown MSK: normal bulk and tone Neuro: Deaf, A&Ox4, CN II-XII intact, strength 5/5 in all 4 extremities Psyche: normal mood and affect   CBC    Component Value Date/Time   WBC 4.5 12/05/2016 0904   RBC 4.67 12/05/2016 0904   HGB 13.4 12/05/2016 0904   HCT 40.4 12/05/2016 0904   PLT 171 12/05/2016 0904   MCV 86.5 12/05/2016 0904   MCH 28.7 12/05/2016 0904   MCHC 33.2 12/05/2016 0904   RDW 13.1 12/05/2016 0904    BMET    Component Value Date/Time   NA 140 12/05/2016 0904   K 4.1 12/05/2016 0904   CL 105 12/05/2016 0904   CO2 25 12/05/2016 0904   GLUCOSE 110 (H) 12/05/2016 0904   BUN 14 12/05/2016  0904   CREATININE 0.64 12/05/2016 0904   CALCIUM 9.6 12/05/2016 0904   GFRNONAA >89 12/05/2016 0904   GFRAA >89 12/05/2016 0904   11/2016 Polysomnogram: IMPRESSIONS - Moderate obstructive sleep apnea occurred during this study (AHI = 15.6/h). - No significant central sleep apnea occurred during this study (CAI = 0.0/h). - Moderate oxygen desaturation was noted during this study (Min O2 = 83%, Mean 92%). -  Patient snored .  DIAGNOSIS - Obstructive Sleep Apnea (327.23 [G47.33 ICD-10])  PFT: 11/2016 Spirometry FEV1 2.4 L, 74% pred, no airflow obstruction     Assessment & Plan:   Shortness of breath - Plan: Pulmonary function test, DG Chest 2 View  Heartburn  Discussion: Misty [name] comes to clinic today for evaluation of shortness of breath.  The shortness of breath is a bit unpredictable and occurs at rest with some burning in her chest.  The differential diagnosis here is broad and includes heart disease versus less likely asthma or gastroesophageal reflux disease exacerbating asthma.  We will start by evaluating her lungs with a lung function test, chest x-ray, and ambulatory oximetry monitoring.  In the meantime I will ask her to start using antacid therapy for the next few weeks to see if this helps with the burning sensation in her chest.  She is not anemic and she does not appear to have evidence of a neuromuscular disorder.  If all lung function testing is within normal limits then we need to arrange for a cardiac evaluation.  Plan: Shortness of breath: We will check a chest x-ray We will check a pulmonary function test We will monitor your oxygen while you are walking today in the office  Chest burning: I think this may be heartburn: Take Pepcid over-the-counter twice a day no matter how you feel Read through the information sheet we gave you  We will see you back in 1-2 weeks to go over these results    Current Outpatient Prescriptions:  .  AMBULATORY NON FORMULARY  MEDICATION, Continuous positive airway pressure (CPAP) machine auto-titrate from 4-20 cm of H2O pressure, with all supplemental supplies as needed., Disp: 1 each, Rfl: 0 .  AMBULATORY NON FORMULARY MEDICATION, Please send 30 day download from CPAP attn Dr Georgina Snell [phone]. Send to [place], Disp: 1 each, Rfl: 0 .  B Complex-C (SUPER B COMPLEX PO), Take by mouth daily., Disp: , Rfl:  .  Calcium Carbonate-Vitamin D (CALCIUM + D PO), Take 600 mg by mouth 2 (two) times daily., Disp: , Rfl:  .  diclofenac sodium (VOLTAREN) 1 % GEL, Apply 2 g topically 4 (four) times daily. To affected joint., Disp: 100 g, Rfl: 11 .  fluticasone (FLONASE) 50 MCG/ACT nasal spray, Place 2 sprays into both nostrils daily., Disp: 16 g, Rfl: 12 .  magnesium oxide (MAG-OX) 400 MG tablet, Take 400 mg by mouth daily., Disp: , Rfl:  .  Multiple Vitamin (MULTIVITAMIN) tablet, Take 1 tablet by mouth daily., Disp: , Rfl:  .  Omega-3 Fatty Acids (FISH OIL) 1200 MG CAPS, Take by mouth daily., Disp: , Rfl:  .  PROAIR HFA 108 (90 Base) MCG/ACT inhaler, INHALE 2 PUFFS INTO THE LUNGS EVERY 6 (SIX) HOURS AS NEEDED FOR WHEEZING OR SHORTNESS OF BREATH., Disp: 8.5 Inhaler, Rfl: 0 .  simvastatin (ZOCOR) 40 MG tablet, Take 1 tablet (40 mg total) by mouth daily at 6 PM., Disp: 90 tablet, Rfl: 3 .  VITAMIN E PO, Take by mouth daily., Disp: , Rfl:

## 2017-04-05 ENCOUNTER — Encounter: Payer: Self-pay | Admitting: Physician Assistant

## 2017-04-13 ENCOUNTER — Encounter: Payer: Self-pay | Admitting: Adult Health

## 2017-04-13 ENCOUNTER — Institutional Professional Consult (permissible substitution): Payer: BLUE CROSS/BLUE SHIELD | Admitting: Pulmonary Disease

## 2017-04-13 ENCOUNTER — Other Ambulatory Visit: Payer: Self-pay | Admitting: Adult Health

## 2017-04-13 ENCOUNTER — Ambulatory Visit (INDEPENDENT_AMBULATORY_CARE_PROVIDER_SITE_OTHER): Payer: BLUE CROSS/BLUE SHIELD | Admitting: Adult Health

## 2017-04-13 DIAGNOSIS — K219 Gastro-esophageal reflux disease without esophagitis: Secondary | ICD-10-CM

## 2017-04-13 DIAGNOSIS — G4733 Obstructive sleep apnea (adult) (pediatric): Secondary | ICD-10-CM

## 2017-04-13 DIAGNOSIS — R0600 Dyspnea, unspecified: Secondary | ICD-10-CM

## 2017-04-13 NOTE — Patient Instructions (Signed)
Continue on Pepcid 20mg  Twice daily  .  GERD diet .  Labs today .  Go for PFT  Follow up with Dr. Lake Bells in 4-6 weeks and As needed   Please contact office for sooner follow up if symptoms do not improve or worsen or seek emergency care

## 2017-04-13 NOTE — Progress Notes (Signed)
Reviewed, agree 

## 2017-04-13 NOTE — Assessment & Plan Note (Signed)
?  GERD sx - improved on Pepcid  Continue for now .  GERD diet .

## 2017-04-13 NOTE — Assessment & Plan Note (Signed)
Cont on CPAP At bedtime  

## 2017-04-13 NOTE — Assessment & Plan Note (Signed)
Intermittent dyspnea ?etiology  Will need to complete PFT as recommended.  CXR with mild cardiomegaly - no obvious signs on exam of CHF , Check BNP . If elevated check Echo . Previous walk test normal without desaturations on room air.  Simple spirometry nml in July without obstruction .   Plan  Patient Instructions  Continue on Pepcid 20mg  Twice daily  .  GERD diet .  Labs today .  Go for PFT  Follow up with Misty [name] in 4-6 weeks and As needed   Please contact office for sooner follow up if symptoms do not improve or worsen or seek emergency care

## 2017-04-13 NOTE — Progress Notes (Signed)
@Patient  ID: Misty [name], female    DOB: 07-Jun-1955, 61 y.o.   MRN: [id number]  Chief Complaint  Patient presents with  . Follow-up    Dyspnea     Referring provider: Lavada Mesi  HPI: 61 yo female never smoker seen for pulmonary consult 03/27/17 for dyspnea.  Has OSA on CPAP ( started rx 12/2016)  Pt is Deaf-    TEST [facility] :  11/2016 Sleep study >AHI 15/hr, SaO2 low 83%.  Spirometry 11/2016 >FEV1 74%, no airflow obstruction   04/13/2017 Follow up : Dyspnea ( Interpretor ) .  Patient returns for a 2-week follow-up.  Patient was seen last visit for a pulmonary consult.  Patient had  been having intermittent dyspnea and chest burning. Over last couple of years. She is a never smoker.  Previously lived in [place] for 57 years.  Symptoms seem to have developed after moving to [place] for 5 yrs ago. .  She had no significant cough.  And no chest pain with exercise.  Patient was felt to have possible underlying GERD and started on Pepcid twice daily.  Chest x-ray showed mild cardiomegaly with no acute process noted in the lungs.  Labs from July showed no anemia . She was set up for a PFT but unfortunately this was not done .  Since last office , feels the burning in chest has stopped. She still has intermittent dyspnea . No associated chest pain. No edema , orthopnea, fever, cough or wheezing .      No Known Allergies  Immunization History  Administered Date(s) Administered  . Influenza,inj,Quad PF,6+ Mos 03/06/2014, 03/02/2017  . Influenza-Unspecified 04/28/2016  . Tdap 07/08/2010  . Zoster 04/18/2011    Past Medical History:  Diagnosis Date  . Arthritis   . Hyperlipidemia   . Osteopenia determined by x-ray 12/07/2016   DEXA scan July 2018    Tobacco History: Social History   Tobacco Use  Smoking Status Never Smoker  Smokeless Tobacco Never Used   Counseling given: Not Answered   Outpatient Encounter Medications as of 04/13/2017  Medication  Sig  . AMBULATORY NON FORMULARY MEDICATION Continuous positive airway pressure (CPAP) machine auto-titrate from 4-20 cm of H2O pressure, with all supplemental supplies as needed.  . AMBULATORY NON FORMULARY MEDICATION Please send 30 day download from CPAP attn Dr Georgina Snell [phone]. Send to [facility]  . B Complex-C (SUPER B COMPLEX PO) Take by mouth daily.  . Calcium Carbonate-Vitamin D (CALCIUM + D PO) Take 600 mg by mouth 2 (two) times daily.  . diclofenac sodium (VOLTAREN) 1 % GEL Apply 2 g topically 4 (four) times daily. To affected joint.  . famotidine (PEPCID) 20 MG tablet Take 20 mg 2 (two) times daily by mouth.  . fluticasone (FLONASE) 50 MCG/ACT nasal spray Place 2 sprays into both nostrils daily.  . magnesium oxide (MAG-OX) 400 MG tablet Take 400 mg by mouth daily.  . Multiple Vitamin (MULTIVITAMIN) tablet Take 1 tablet by mouth daily.  . Omega-3 Fatty Acids (FISH OIL) 1200 MG CAPS Take by mouth daily.  Marland Kitchen PROAIR HFA 108 (90 Base) MCG/ACT inhaler INHALE 2 PUFFS INTO THE LUNGS EVERY 6 (SIX) HOURS AS NEEDED FOR WHEEZING OR SHORTNESS OF BREATH.  . simvastatin (ZOCOR) 40 MG tablet Take 1 tablet (40 mg total) by mouth daily at 6 PM.  . VITAMIN E PO Take by mouth daily.   No facility-administered encounter medications on file as of 04/13/2017.      Review of  Systems  Constitutional:   No  weight loss, night sweats,  Fevers, chills, fatigue, or  lassitude.  HEENT:   No headaches,  Difficulty swallowing,  Tooth/dental problems, or  Sore throat,                No sneezing, itching, ear ache, nasal congestion, post nasal drip,   CV:  No chest pain,  Orthopnea, PND, swelling in lower extremities, anasarca, dizziness, palpitations, syncope.   GI  No heartburn, indigestion, abdominal pain, nausea, vomiting, diarrhea, change in bowel habits, loss of appetite, bloody stools.   Resp:  .  No excess mucus, no productive cough,  No non-productive cough,  No coughing up of blood.  No change in color  of mucus.  No wheezing.  No chest wall deformity  Skin: no rash or lesions.  GU: no dysuria, change in color of urine, no urgency or frequency.  No flank pain, no hematuria   MS:  No joint pain or swelling.  No decreased range of motion.  No back pain.    Physical Exam  BP 139/80 (BP Location: Left Arm, Cuff Size: Normal)   Pulse 67   Ht 5\' 5"  (1.651 m)   Wt 165 lb (74.8 kg)   SpO2 96%   BMI 27.46 kg/m   GEN: A/Ox3; pleasant , NAD, well nourished    HEENT:  [place]/AT,  EACs-clear, TMs-wnl, NOSE-clear, THROAT-clear, no lesions, no postnasal drip or exudate noted.   NECK:  Supple w/ fair ROM; no JVD; normal carotid impulses w/o bruits; no thyromegaly or nodules palpated; no lymphadenopathy.    RESP  Clear  P & A; w/o, wheezes/ rales/ or rhonchi. no accessory muscle use, no dullness to percussion  CARD:  RRR, no m/r/g, no peripheral edema, pulses intact, no cyanosis or clubbing.  GI:   Soft & nt; nml bowel sounds; no organomegaly or masses detected.   Musco: Warm bil, no deformities or joint swelling noted.   Neuro: alert, no focal deficits noted.    Skin: Warm, no lesions or rashes    Lab Results:  CBC    Component Value Date/Time   WBC 4.5 12/05/2016 0904   RBC 4.67 12/05/2016 0904   HGB 13.4 12/05/2016 0904   HCT 40.4 12/05/2016 0904   PLT 171 12/05/2016 0904   MCV 86.5 12/05/2016 0904   MCH 28.7 12/05/2016 0904   MCHC 33.2 12/05/2016 0904   RDW 13.1 12/05/2016 0904    BMET    Component Value Date/Time   NA 140 12/05/2016 0904   K 4.1 12/05/2016 0904   CL 105 12/05/2016 0904   CO2 25 12/05/2016 0904   GLUCOSE 110 (H) 12/05/2016 0904   BUN 14 12/05/2016 0904   CREATININE 0.64 12/05/2016 0904   CALCIUM 9.6 12/05/2016 0904   GFRNONAA >89 12/05/2016 0904   GFRAA >89 12/05/2016 0904    BNP No results found for: BNP  ProBNP No results found for: PROBNP  Imaging: Dg Chest 2 View  Result Date: 03/28/2017 CLINICAL DATA:  Chronic shortness of breath.  EXAM: CHEST  2 VIEW COMPARISON:  No recent prior . FINDINGS: Mediastinum and hilar structures are normal. Cardiomegaly with normal pulmonary vascularity. No focal infiltrate. Dense small radiopacity noted over the right apex. This could possibly represent a calcified granuloma or small sclerotic focus such as a bone island in the right anterior first rib. No pleural effusion or pneumothorax. No acute bony abnormality. IMPRESSION: Cardiomegaly with normal pulmonary vascularity. No focal pulmonary infiltrate .  Electronically Signed   By: Marcello Moores  Register   On: 03/28/2017 08:41     Assessment & Plan:   Dyspnea Intermittent dyspnea ?etiology  Will need to complete PFT as recommended.  CXR with mild cardiomegaly - no obvious signs on exam of CHF , Check BNP . If elevated check Echo . Previous walk test normal without desaturations on room air.  Simple spirometry nml in July without obstruction .   Plan  Patient Instructions  Continue on Pepcid 20mg  Twice daily  .  GERD diet .  Labs today .  Go for PFT  Follow up with Dr. Lake Bells in 4-6 weeks and As needed   Please contact office for sooner follow up if symptoms do not improve or worsen or seek emergency care      OSA (obstructive sleep apnea) Cont on CPAP At bedtime     GERD (gastroesophageal reflux disease) ?GERD sx - improved on Pepcid  Continue for now .  GERD diet .      Rexene Edison, NP 04/13/2017

## 2017-04-14 LAB — BRAIN NATRIURETIC PEPTIDE: BNP: 7.6 pg/mL (ref 0.0–100.0)

## 2017-04-26 ENCOUNTER — Ambulatory Visit (INDEPENDENT_AMBULATORY_CARE_PROVIDER_SITE_OTHER): Payer: BLUE CROSS/BLUE SHIELD | Admitting: Pulmonary Disease

## 2017-04-26 DIAGNOSIS — R0602 Shortness of breath: Secondary | ICD-10-CM

## 2017-04-26 NOTE — Progress Notes (Signed)
PFT attempted today but could not be completed. Pt gave a very good effort but could not properly perform the technique of each test. BQ was made aware of this. He asked for pt to be scheduled for a 6MW test. This has been scheduled for 04/28/17 at 9:30am.

## 2017-04-28 ENCOUNTER — Ambulatory Visit (INDEPENDENT_AMBULATORY_CARE_PROVIDER_SITE_OTHER): Payer: BLUE CROSS/BLUE SHIELD | Admitting: *Deleted

## 2017-04-28 DIAGNOSIS — R0600 Dyspnea, unspecified: Secondary | ICD-10-CM | POA: Diagnosis not present

## 2017-05-01 ENCOUNTER — Telehealth (HOSPITAL_COMMUNITY): Payer: Self-pay | Admitting: *Deleted

## 2017-05-01 ENCOUNTER — Encounter: Payer: Self-pay | Admitting: Pulmonary Disease

## 2017-05-01 ENCOUNTER — Ambulatory Visit (INDEPENDENT_AMBULATORY_CARE_PROVIDER_SITE_OTHER): Payer: BLUE CROSS/BLUE SHIELD | Admitting: Pulmonary Disease

## 2017-05-01 VITALS — BP 134/79 | HR 90 | Ht 65.0 in | Wt 168.0 lb

## 2017-05-01 DIAGNOSIS — K219 Gastro-esophageal reflux disease without esophagitis: Secondary | ICD-10-CM

## 2017-05-01 DIAGNOSIS — R0600 Dyspnea, unspecified: Secondary | ICD-10-CM

## 2017-05-01 DIAGNOSIS — G4733 Obstructive sleep apnea (adult) (pediatric): Secondary | ICD-10-CM

## 2017-05-01 DIAGNOSIS — J4520 Mild intermittent asthma, uncomplicated: Secondary | ICD-10-CM | POA: Diagnosis not present

## 2017-05-01 MED ORDER — ALBUTEROL SULFATE HFA 108 (90 BASE) MCG/ACT IN AERS: 2.0000 | INHALATION_SPRAY | Freq: Four times a day (QID) | RESPIRATORY_TRACT | 2 refills | Status: DC | PRN

## 2017-05-01 NOTE — Addendum Note (Signed)
Addended by: Valerie Salts on: 05/01/2017 02:36 PM   Modules accepted: Orders

## 2017-05-01 NOTE — Progress Notes (Signed)
Subjective:    Patient ID: Misty [name], female    DOB: 1956/02/06, 61 y.o.   MRN: [id number]  Synopsis: Referred in 2018 for Dyspnea She was diagnosed with sleep apnea in 2017, started CPAP 12/2016  HPI Chief Complaint  Patient presents with  . Follow-up    Follow up after PFT test. Increased SOB that started today, Per her Apple watch, her HR has increased as well. Overall, she feels fine.    Mona says that she still has some dyspnea which has been on and off. She says that her heart rate went elevated today while busy around the house.  She says that this wil hasppen from time to time.  When she is stressed out or if she is climbing a hill. She feels like she can't get the air in when she is short of breath.  She feels like her nose is closed up, also perhaps her throat is closed up.  No heartburn, no indigestion.     Past Medical History:  Diagnosis Date  . Arthritis   . Hyperlipidemia   . Osteopenia determined by x-ray 12/07/2016   DEXA scan July 2018     Family History  Problem Relation Age of Onset  . Hyperlipidemia Mother   . Cancer Father        prostate  . Diabetes Father   . Cancer Maternal Grandfather        colon cancer  . Stroke Maternal Grandfather         Review of Systems  Constitutional: Negative for fever and unexpected weight change.  HENT: Positive for sore throat. Negative for congestion, dental problem, ear pain, nosebleeds, postnasal drip, rhinorrhea, sinus pressure, sneezing and trouble swallowing.   Eyes: Negative for redness and itching.  Respiratory: Positive for cough, chest tightness and shortness of breath. Negative for wheezing.   Cardiovascular: Negative for palpitations and leg swelling.  Gastrointestinal: Negative for nausea and vomiting.  Genitourinary: Negative for dysuria.  Musculoskeletal: Negative for joint swelling.  Skin: Negative for rash.  Neurological: Negative for headaches.  Hematological: Does not bruise/bleed easily.    Psychiatric/Behavioral: Negative for dysphoric mood. The patient is not nervous/anxious.        Objective:   Physical Exam Vitals:   05/01/17 1356  BP: 134/79  Pulse: 90  SpO2: 96%  Weight: 168 lb (76.2 kg)  Height: 5\' 5"  (1.651 m)   Gen: well appearing HENT: OP clear, TM's clear, neck supple PULM: CTA B, normal percussion CV: RRR, slight systolic murmur, trace edema GI: BS+, soft, nontender Derm: no cyanosis or rash Psyche: normal mood and affect    CBC    Component Value Date/Time   WBC 4.5 12/05/2016 0904   RBC 4.67 12/05/2016 0904   HGB 13.4 12/05/2016 0904   HCT 40.4 12/05/2016 0904   PLT 171 12/05/2016 0904   MCV 86.5 12/05/2016 0904   MCH 28.7 12/05/2016 0904   MCHC 33.2 12/05/2016 0904   RDW 13.1 12/05/2016 0904    BMET    Component Value Date/Time   NA 140 12/05/2016 0904   K 4.1 12/05/2016 0904   CL 105 12/05/2016 0904   CO2 25 12/05/2016 0904   GLUCOSE 110 (H) 12/05/2016 0904   BUN 14 12/05/2016 0904   CREATININE 0.64 12/05/2016 0904   CALCIUM 9.6 12/05/2016 0904   GFRNONAA >89 12/05/2016 0904   GFRAA >89 12/05/2016 0904   November 2018 brain natruretic peptide normal  11/2016 Polysomnogram: IMPRESSIONS - Moderate obstructive sleep  apnea occurred during this study (AHI = 15.6/h). - No significant central sleep apnea occurred during this study (CAI = 0.0/h). - Moderate oxygen desaturation was noted during this study (Min O2 = 83%, Mean 92%). - Patient snored .  DIAGNOSIS - Obstructive Sleep Apnea (327.23 [G47.33 ICD-10])  PFT: 11/2016 Spirometry FEV1 2.4 L, 74% pred, no airflow obstruction  6MW:  November 2018 distance 384 m, O2 saturation nadir 95%, room air     Assessment & Plan:   Dyspnea, unspecified type  Gastroesophageal reflux disease without esophagitis  OSA (obstructive sleep apnea)  Mild intermittent asthma without complication  Discussion: She still has some shortness of breath, we were somewhat limited in our  ability to assess her breathing because she was unable to complete the lung function test.  However, her oxygenation was normal with exam and there is no clear lung abnormality on the chest x-ray.    She may have mild intermittent asthma considering the intermittent nature of her symptoms and the prior diagnosis of asthma.  However, the differential diagnosis still includes cardiac disease even though she had a normal proBNP recently.  She does have cardiomegaly on physical exam.  She may also be deconditioned.  So because of my concern for cardiac disease I like for her to have a stress test prior to embarking on an exercise routine.  We will also give her an albuterol inhaler to use on an as-needed basis.  If she still feeling significant shortness of breath after starting on a regular exercise routine then in 2-3 months we can consider a CT scan of the chest and/or referral to cardiology for heart catheterization.  Plan: Asthma: Mild intermittent Use albuterol 2 puffs every 4-6 hours as needed for chest tightness wheezing or shortness of breath  GERD:  Keep taking Pepcid twice a day  Obstructive sleep apnea: Keep using CPAP nightly.  Shortness of breath: Because you have some chest heaviness and mild enlargement of your heart we will arrange for a cardiac stress test to evaluate this further If the cardiac stress test is normal then I would like for you to exercise more frequently  Come back to see [place] in 2-3 months to see how things are doing    Current Outpatient Medications:  .  AMBULATORY NON FORMULARY MEDICATION, Continuous positive airway pressure (CPAP) machine auto-titrate from 4-20 cm of H2O pressure, with all supplemental supplies as needed., Disp: 1 each, Rfl: 0 .  AMBULATORY NON FORMULARY MEDICATION, Please send 30 day download from CPAP attn Dr Georgina Snell [phone]. Send to [place], Disp: 1 each, Rfl: 0 .  B Complex-C (SUPER B COMPLEX PO), Take by mouth daily., Disp: , Rfl:  .   Calcium Carbonate-Vitamin D (CALCIUM + D PO), Take 600 mg by mouth 2 (two) times daily., Disp: , Rfl:  .  diclofenac sodium (VOLTAREN) 1 % GEL, Apply 2 g topically 4 (four) times daily. To affected joint., Disp: 100 g, Rfl: 11 .  famotidine (PEPCID) 20 MG tablet, Take 20 mg 2 (two) times daily by mouth., Disp: , Rfl:  .  fluticasone (FLONASE) 50 MCG/ACT nasal spray, Place 2 sprays into both nostrils daily., Disp: 16 g, Rfl: 12 .  magnesium oxide (MAG-OX) 400 MG tablet, Take 400 mg by mouth daily., Disp: , Rfl:  .  Multiple Vitamin (MULTIVITAMIN) tablet, Take 1 tablet by mouth daily., Disp: , Rfl:  .  Omega-3 Fatty Acids (FISH OIL) 1200 MG CAPS, Take by mouth daily., Disp: , Rfl:  .  PROAIR HFA 108 (90 Base) MCG/ACT inhaler, INHALE 2 PUFFS INTO THE LUNGS EVERY 6 (SIX) HOURS AS NEEDED FOR WHEEZING OR SHORTNESS OF BREATH., Disp: 8.5 Inhaler, Rfl: 0 .  simvastatin (ZOCOR) 40 MG tablet, Take 1 tablet (40 mg total) by mouth daily at 6 PM., Disp: 90 tablet, Rfl: 3 .  VITAMIN E PO, Take by mouth daily., Disp: , Rfl:

## 2017-05-01 NOTE — Addendum Note (Signed)
Addended by: Valerie Salts on: 05/01/2017 02:59 PM   Modules accepted: Orders

## 2017-05-01 NOTE — Telephone Encounter (Signed)
Patient's husband [name] given detailed instructions per DPR per Myocardial Perfusion Study Information Sheet for the test on 05/03/17 at 0745. Patient notified to arrive 15 minutes early and that it is imperative to arrive on time for appointment to keep from having the test rescheduled.  If you need to cancel or reschedule your appointment, please call the office within 24 hours of your appointment. . Patient verbalized understanding.Misty [name], Ranae Palms

## 2017-05-01 NOTE — Patient Instructions (Addendum)
Asthma: Mild intermittent Use albuterol 2 puffs every 4-6 hours as needed for chest tightness wheezing or shortness of breath  GERD:  Keep taking Pepcid twice a day  Obstructive sleep apnea: Keep using CPAP nightly.  Shortness of breath: Because you have some chest heaviness and mild enlargement of your heart we will arrange for a cardiac stress test to evaluate this further If the cardiac stress test is normal then I would like for you to exercise more frequently  Come back to see [place] in 2-3 months to see how things are doing

## 2017-05-03 ENCOUNTER — Ambulatory Visit (HOSPITAL_COMMUNITY): Payer: BLUE CROSS/BLUE SHIELD | Attending: Cardiology

## 2017-05-03 DIAGNOSIS — R002 Palpitations: Secondary | ICD-10-CM | POA: Diagnosis not present

## 2017-05-03 DIAGNOSIS — R0609 Other forms of dyspnea: Secondary | ICD-10-CM | POA: Diagnosis not present

## 2017-05-03 DIAGNOSIS — R0600 Dyspnea, unspecified: Secondary | ICD-10-CM | POA: Diagnosis not present

## 2017-05-03 LAB — MYOCARDIAL PERFUSION IMAGING
LV dias vol: 66 mL (ref 46–106)
LV sys vol: 15 mL
Peak HR: 107 {beats}/min
RATE: 0.18
Rest HR: 74 {beats}/min
SDS: 3
SRS: 5
SSS: 8
TID: 0.85

## 2017-05-03 MED ORDER — TECHNETIUM TC 99M TETROFOSMIN IV KIT
10.1000 | PACK | Freq: Once | INTRAVENOUS | Status: AC | PRN
  Administered 2017-05-03: 10.1 via INTRAVENOUS
  Filled 2017-05-03: qty 11

## 2017-05-03 MED ORDER — REGADENOSON 0.4 MG/5ML IV SOLN
0.4000 mg | Freq: Once | INTRAVENOUS | Status: AC
  Administered 2017-05-03: 0.4 mg via INTRAVENOUS

## 2017-05-03 MED ORDER — TECHNETIUM TC 99M TETROFOSMIN IV KIT
30.8000 | PACK | Freq: Once | INTRAVENOUS | Status: AC | PRN
  Administered 2017-05-03: 30.8 via INTRAVENOUS
  Filled 2017-05-03: qty 31

## 2017-05-03 NOTE — Progress Notes (Unsigned)
Interpreter From [facility] present for Procedure- [place].

## 2017-07-10 ENCOUNTER — Encounter: Payer: Self-pay | Admitting: Emergency Medicine

## 2017-07-10 ENCOUNTER — Emergency Department (INDEPENDENT_AMBULATORY_CARE_PROVIDER_SITE_OTHER)
Admission: EM | Admit: 2017-07-10 | Discharge: 2017-07-10 | Disposition: A | Discharge status: Home / Self Care | Payer: BLUE CROSS/BLUE SHIELD | Source: Home / Self Care | Attending: Family Medicine | Admitting: Family Medicine

## 2017-07-10 ENCOUNTER — Other Ambulatory Visit: Payer: Self-pay

## 2017-07-10 DIAGNOSIS — H00021 Hordeolum internum right upper eyelid: Secondary | ICD-10-CM | POA: Diagnosis not present

## 2017-07-10 MED ORDER — SULFACETAMIDE SODIUM 10 % OP SOLN
1.0000 [drp] | OPHTHALMIC | 0 refills | Status: DC
Start: 2017-07-10 — End: 2018-01-31

## 2017-07-10 NOTE — Discharge Instructions (Signed)
Apply warm compress to right eye every 2 to 3 hours. If symptoms become significantly worse during the night or over the weekend, proceed to the local emergency room.

## 2017-07-10 NOTE — ED Provider Notes (Signed)
[facility]    CSN: [id number] Arrival date & time: 07/10/17  1304     History   Chief Complaint Chief Complaint  Patient presents with  . Eye Problem    HPI Misty [name] is a 62 y.o. female.   Patient complains of onset of swelling in her right upper eyelid yesterday morning.  Today she had crusting, irritation and burning in her right eye   The history is provided by the patient.  Eye Problem  Location:  Right eye Quality:  Burning Severity:  Mild Onset quality:  Sudden Duration:  1 day Timing:  Constant Progression:  Worsening Chronicity:  New Context: not contact lens problem, not direct trauma, not foreign body and not scratch   Relieved by:  None tried Worsened by:  Eye movement Ineffective treatments:  None tried Associated symptoms: blurred vision, crusting, inflammation, redness, swelling and tearing   Associated symptoms: no decreased vision, no discharge, no double vision, no facial rash, no headaches, no itching and no photophobia     Past Medical History:  Diagnosis Date  . Arthritis   . Hyperlipidemia   . Osteopenia determined by x-ray 12/07/2016   DEXA scan July 2018    Patient Active Problem List   Diagnosis Date Noted  . Dyspnea 04/13/2017  . GERD (gastroesophageal reflux disease) 04/13/2017  . OSA (obstructive sleep apnea) 01/02/2017  . Osteopenia determined by x-ray 12/07/2016  . Fatigue 12/05/2016  . Gluteal tendonitis 07/25/2016  . Screening for breast cancer 07/25/2016  . Trigger finger, right 05/17/2016  . Polyp, cervix 09/09/2015  . Midline low back pain without sciatica 09/09/2015  . Pre-diabetes 12/27/2013  . Essential hypertension, benign 12/16/2013  . Hyperlipidemia 12/16/2013  . Frequent headaches 12/16/2013  . Hx of extrinsic asthma 12/16/2013  . 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 No pertinent surgical history.  OB History    No data available       Home Medications    Prior to  Admission medications   Medication Sig Start Date End Date Taking? Authorizing Provider  albuterol (PROVENTIL HFA;VENTOLIN HFA) 108 (90 Base) MCG/ACT inhaler Inhale 2 puffs into the lungs every 6 (six) hours as needed for wheezing or shortness of breath. 05/01/17   Juanito Doom, MD  AMBULATORY NON FORMULARY MEDICATION Continuous positive airway pressure (CPAP) machine auto-titrate from 4-20 cm of H2O pressure, with all supplemental supplies as needed. 01/13/17   Gregor Hams, MD  AMBULATORY NON FORMULARY MEDICATION Please send 30 day download from CPAP attn Dr Georgina Snell [phone]. Send to [facility] 03/02/17   Gregor Hams, MD  Calcium Carbonate-Vitamin D (CALCIUM + D PO) Take 600 mg by mouth 2 (two) times daily.    [provider]  diclofenac sodium (VOLTAREN) 1 % GEL Apply 2 g topically 4 (four) times daily. To affected joint. 06/14/16   Gregor Hams, MD  famotidine (PEPCID) 20 MG tablet Take 20 mg 2 (two) times daily by mouth.    [provider]  fluticasone (FLONASE) 50 MCG/ACT nasal spray Place 2 sprays into both nostrils daily. 12/07/16   Gregor Hams, MD  magnesium oxide (MAG-OX) 400 MG tablet Take 400 mg by mouth daily.    [provider]  Multiple Vitamin (MULTIVITAMIN) tablet Take 1 tablet by mouth daily.    [provider]  Omega-3 Fatty Acids (FISH OIL) 1200 MG CAPS Take by mouth daily.    [provider]  PROAIR HFA 108 [phone] Base) MCG/ACT  inhaler INHALE 2 PUFFS INTO THE LUNGS EVERY 6 (SIX) HOURS AS NEEDED FOR WHEEZING OR SHORTNESS OF BREATH. 11/03/15   Breeback, Jade L, PA-C  simvastatin (ZOCOR) 40 MG tablet Take 1 tablet (40 mg total) by mouth daily at 6 PM. 12/08/16   Gregor Hams, MD  sulfacetamide (BLEPH-10) 10 % ophthalmic solution Place 1 drop into the right eye every 3 (three) hours while awake. 07/10/17   Kandra Nicolas, MD  VITAMIN E PO Take by mouth daily.    [provider]    Family History Family History  Problem  Relation Age of Onset  . Hyperlipidemia Mother   . Cancer Father        prostate  . Diabetes Father   . Cancer Maternal Grandfather        colon cancer  . Stroke Maternal Grandfather     Social History Social History   Tobacco Use  . Smoking status: Never Smoker  . Smokeless tobacco: Never Used  Substance Use Topics  . Alcohol use: No    Frequency: Never  . Drug use: No     Allergies   Patient has no known allergies.   Review of Systems Review of Systems  Eyes: Positive for blurred vision and redness. Negative for double vision, photophobia, discharge and itching.  Neurological: Negative for headaches.  All other systems reviewed and are negative.    Physical Exam Triage Vital Signs ED Triage Vitals  Enc Vitals Group     BP 07/10/17 1420 (!) 154/85     Pulse Rate 07/10/17 1420 72     Resp --      Temp 07/10/17 1420 98.1 F (36.7 C)     Temp Source 07/10/17 1420 Oral     SpO2 07/10/17 1420 96 %     Weight 07/10/17 1421 170 lb (77.1 kg)     Height 07/10/17 1421 5\' 6"  (1.676 m)     Head Circumference --      Peak Flow --      Pain Score --      Pain Loc --      Pain Edu? --      Excl. in [place]? --    No data found.  Updated Vital Signs BP (!) 154/85 (BP Location: Right Arm)   Pulse 72   Temp 98.1 F (36.7 C) (Oral)   Ht 5\' 6"  (1.676 m)   Wt 170 lb (77.1 kg)   SpO2 96%   BMI 27.44 kg/m   Visual Acuity Right Eye Distance: 20/50 Left Eye Distance: 20/30 Bilateral Distance: 20/30(with correction)  Right Eye Near:   Left Eye Near:    Bilateral Near:     Physical Exam  Constitutional: She appears well-developed and well-nourished. No distress.  HENT:  Head: Normocephalic.  Right Ear: External ear normal.  Left Ear: External ear normal.  Nose: Nose normal.  Mouth/Throat: Oropharynx is clear and moist.  Eyes: Conjunctivae and EOM are normal. Pupils are equal, round, and reactive to light.    Right upper eyelid has localized swelling, erythema,  and tenderness to palpation as noted on diagram.   Neck: Neck supple.  Cardiovascular: Normal rate.  Pulmonary/Chest: Effort normal.  Lymphadenopathy:    She has no cervical adenopathy.  Neurological: She is alert.  Skin: Skin is warm and dry.  Vitals reviewed.    UC Treatments / Results  Labs (all labs ordered are listed, but only abnormal results are displayed) Labs Reviewed - No data  to display  EKG  EKG Interpretation None       Radiology No results found.  Procedures Procedures (including critical care time)  Medications Ordered in UC Medications - No data to display   Initial Impression / Assessment and Plan / UC Course  I have reviewed the triage vital signs and the nursing notes.  Pertinent labs & imaging results that were available during my care of the patient were reviewed by me and considered in my medical decision making (see chart for details).    Begin sulfacetamide ophthalmic suspension. Apply warm compress to right eye every 2 to 3 hours. If symptoms become significantly worse during the night or over the weekend, proceed to the local emergency room.  Followup with ophthalmologist if not improved 4 days.    Final Clinical Impressions(s) / UC Diagnoses   Final diagnoses:  Hordeolum internum of right upper eyelid    ED Discharge Orders        Ordered    sulfacetamide (BLEPH-10) 10 % ophthalmic solution  Every  3 hours while awake     07/10/17 1436           Kandra Nicolas, MD 07/17/17 2020

## 2017-07-10 NOTE — ED Triage Notes (Signed)
Right eye burning, irritated, woke up with it crusty this morning, vision is blurred

## 2017-12-14 ENCOUNTER — Other Ambulatory Visit: Payer: Self-pay | Admitting: Family Medicine

## 2017-12-26 ENCOUNTER — Other Ambulatory Visit: Payer: Self-pay | Admitting: Family Medicine

## 2018-01-10 ENCOUNTER — Ambulatory Visit (INDEPENDENT_AMBULATORY_CARE_PROVIDER_SITE_OTHER): Payer: Medicare Other

## 2018-01-10 ENCOUNTER — Ambulatory Visit (INDEPENDENT_AMBULATORY_CARE_PROVIDER_SITE_OTHER): Payer: Medicare Other | Admitting: Family Medicine

## 2018-01-10 ENCOUNTER — Other Ambulatory Visit: Payer: Self-pay | Admitting: Physician Assistant

## 2018-01-10 VITALS — BP 136/49 | HR 80 | Wt 162.0 lb

## 2018-01-10 DIAGNOSIS — M19041 Primary osteoarthritis, right hand: Secondary | ICD-10-CM

## 2018-01-10 DIAGNOSIS — M79644 Pain in right finger(s): Secondary | ICD-10-CM | POA: Diagnosis not present

## 2018-01-10 DIAGNOSIS — Z1231 Encounter for screening mammogram for malignant neoplasm of breast: Secondary | ICD-10-CM

## 2018-01-10 MED ORDER — DICLOFENAC SODIUM 1 % TD GEL: 2.0000 g | Freq: Four times a day (QID) | TRANSDERMAL | 11 refills | Status: DC

## 2018-01-10 MED ORDER — DOXYCYCLINE HYCLATE 100 MG PO TABS: 100.0000 mg | ORAL_TABLET | Freq: Two times a day (BID) | ORAL | 0 refills | Status: DC

## 2018-01-10 NOTE — Progress Notes (Signed)
Misty [name] is a 63 y.o. female who presents to [place] [place] today for finger pain.  Caddie notes a approximately 1 month history of pain and swelling at the right third DIP.  She cannot recall an injury but does note that she suffered a similar needlestick injury around the DIP joint about a month and a half ago.  She notes redness pain and swelling.  She has some throbbing and a little bit of numbness on the pad of her finger palmar aspect.  She notes symptoms are worse with activity.  She is not had much treatment yet.  No fevers or chills nausea vomiting or diarrhea.   ROS as above:  Exam:  BP (!) 136/49   Pulse 80   Wt 162 lb (73.5 kg)   BMI 26.15 kg/m  Gen: Well NAD HEENT: EOMI,  MMM Lungs: Normal work of breathing. CTABL Heart: RRR no MRG Abd: NABS, Soft. Nondistended, Nontender Exts: Brisk capillary refill, warm and well perfused.  Right hand third digit swelling with erythema at the DIP.  Motion of the hand is intact with the exception of DIP which has limited flexion extension due to pain.  Strength is intact however.  Sensations intact distally at the tip of the finger.  Capillary refill is also intact.  Pulses and capillary refill are intact in the wrist.   Lab and Radiology Results No results found for this or any previous visit (from the past 72 hour(s)). Dg Finger Middle Right  Result Date: 01/10/2018 CLINICAL DATA:  Right middle finger pain EXAM: RIGHT MIDDLE FINGER 2+V COMPARISON:  None. FINDINGS: No acute fracture or dislocation. 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 No aggressive osseous lesion. Soft tissues are normal. IMPRESSION: 1. 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 2. 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 Electronically  Signed   By: Kathreen Devoid   On: 01/10/2018 16:06  I personally (independently) visualized and performed the interpretation of the images attached in this note.  Limited musculoskeletal ultrasound of the right third DIP. Tiny joint effusion in the setting of significant narrow joint space with spur formation present. Intact flexor and extensor tendons.  Bony structures are unremarkable.     Assessment and Plan: 62 y.o. female with pain and swelling at right third DIP.  Patient has severe DJD seen on x-ray.  I am concerned based on her history for a septic joint or early osteomyelitis.  Unfortunately she does not have enough fluid in her DIP joint to aspirate for culture.  We will treat empirically for the possibility of septic arthritis or osteomyelitis with a 1 month course of doxycycline.    The patient does have severe osteoarthritis in this joint seen on x-ray.  Arthritis flare could certainly cause the majority of her symptoms as well.  Plan to treat with diclofenac gel.  Recheck in 4 weeks.  Visit conducted using an American sign language interpreter remotely. Orders Placed This Encounter  Procedures  . DG Finger Middle Right    Order Specific Question:   Reason for exam:    Answer:   eval pain dip. Suspect osteomyelitis or septic joint    Order Specific Question:   Preferred imaging location?    Answer:   [facility]   Meds ordered this encounter  Medications  . doxycycline (VIBRA-TABS) 100 MG tablet  Sig: Take 1 tablet (100 mg total) by mouth 2 (two) times daily.    Dispense:  60 tablet    Refill:  0  . diclofenac sodium (VOLTAREN) 1 % GEL    Sig: Apply 2 g topically 4 (four) times daily. To affected joint.    Dispense:  100 g    Refill:  11     Historical information moved to improve visibility of documentation.  Past Medical History:  Diagnosis Date  . Arthritis   . Hyperlipidemia   . Osteopenia determined by x-ray 12/07/2016   DEXA scan July 2018    No past surgical history on file. Social History   Tobacco Use  . Smoking status: Never Smoker  . Smokeless tobacco: Never Used  Substance Use Topics  . Alcohol use: No    Frequency: Never   family history includes Cancer in her father and maternal grandfather; Diabetes in her father; Hyperlipidemia in her mother; Stroke in her maternal grandfather.  Medications: Current Outpatient Medications  Medication Sig Dispense Refill  . albuterol (PROVENTIL HFA;VENTOLIN HFA) 108 (90 Base) MCG/ACT inhaler Inhale 2 puffs into the lungs every 6 (six) hours as needed for wheezing or shortness of breath. 1 Inhaler 2  . AMBULATORY NON FORMULARY MEDICATION Continuous positive airway pressure (CPAP) machine auto-titrate from 4-20 cm of H2O pressure, with all supplemental supplies as needed. 1 each 0  . AMBULATORY NON FORMULARY MEDICATION Please send 30 day download from CPAP attn Dr Georgina Snell [phone]. Send to [facility] 1 each 0  . Calcium Carbonate-Vitamin D (CALCIUM + D PO) Take 600 mg by mouth 2 (two) times daily.    . diclofenac sodium (VOLTAREN) 1 % GEL Apply 2 g topically 4 (four) times daily. To affected joint. 100 g 11  . famotidine (PEPCID) 20 MG tablet Take 20 mg 2 (two) times daily by mouth.    . fluticasone (FLONASE) 50 MCG/ACT nasal spray SPRAY 2 SPRAYS INTO EACH NOSTRIL EVERY DAY 16 g 12  . magnesium oxide (MAG-OX) 400 MG tablet Take 400 mg by mouth daily.    . Multiple Vitamin (MULTIVITAMIN) tablet Take 1 tablet by mouth daily.    . Omega-3 Fatty Acids (FISH OIL) 1200 MG CAPS Take by mouth daily.    Marland Kitchen PROAIR HFA 108 (90 Base) MCG/ACT inhaler INHALE 2 PUFFS INTO THE LUNGS EVERY 6 (SIX) HOURS AS NEEDED FOR WHEEZING OR SHORTNESS OF BREATH. 8.5 Inhaler 0  . simvastatin (ZOCOR) 40 MG tablet Take 1 tablet (40 mg total) by mouth daily at 6 PM. Must have OV for further refills 30 tablet 0  . sulfacetamide (BLEPH-10) 10 % ophthalmic solution Place 1 drop into the right eye every 3 (three) hours while  awake. 5 mL 0  . VITAMIN E PO Take by mouth daily.    Marland Kitchen doxycycline (VIBRA-TABS) 100 MG tablet Take 1 tablet (100 mg total) by mouth 2 (two) times daily. 60 tablet 0   No current facility-administered medications for this visit.    No Known Allergies   Discussed warning signs or symptoms. Please see discharge instructions. Patient expresses understanding.

## 2018-01-10 NOTE — Patient Instructions (Signed)
Thank you for coming in today. Take the doxycycline twice daily for 1 month. Use the diclofenac (voltaren) gel 4x daily for pain.  Use the splint as needed.  Recheck in 4 weeks.  Return sooner if needed.   Bone and Joint Infections, Adult Bone infections (osteomyelitis) and joint infections (septic arthritis) occur when bacteria or other germs get inside a bone or a joint. This can happen if you have an infection in another part of your body that spreads through your blood. Germs from your skin or from outside of your body can also cause this type of infection if you have a wound or a broken bone (fracture) that breaks the skin. Anyone can get a bone infection or joint infection. You may be more likely to get this type of infection if you have a condition, such as diabetes, that lowers your ability to fight infection or increases your chances of getting an infection. Bone and joint infections can cause damage, and they can spread to other areas of your body. They need to be treated quickly. What are the causes? Most bone and joint infections are caused by bacteria. They can also be caused by other germs, such as viruses and funguses. What increases the risk? This condition is more likely to develop in:  People who recently had surgery, especially bone or joint surgery.  People who have a long-term (chronic) disease, such as: ? HIV (human immunodeficiency virus). ? Diabetes. ? Rheumatoid arthritis. ? Sickle cell anemia.  Elderly people.  People who take medicines that block or weaken the body's defense system (immune system).  People who have a condition that reduces their blood flow.  People who are on kidney dialysis.  People who have an artificial joint.  People who have had a joint or bone repaired with plates or screws (surgical hardware).  People who use or abuse IV drugs.  People who have had trauma, such as stepping on a nail.  What are the signs or symptoms? Symptoms  vary depending on the type and location of your infection. Common symptoms of bone and joint infections include:  Fever and chills.  Redness and warmth.  Swelling.  Pain and stiffness.  Drainage of fluid or pus near the infection.  Weight loss and fatigue.  Decreased ability to use a hand or foot.  How is this diagnosed? This condition may be diagnosed based on symptoms, medical history, a physical exam, and diagnostic tests. Tests can help to identify the cause of the infection. You may have various tests, such as:  A sample of tissue, fluid, or blood taken to be examined under a microscope.  A procedure to remove fluid from the infected joint with a needle (joint aspiration) for testing in a lab.  Pus or discharge swabbed from a wound for testing to identify germs and to determine what type of medicine will kill them (culture and sensitivity).  Blood tests to look for evidence of infection and inflammation (biomarkers).  Imaging studies to determine how severe the bone or joint infection is. These may include: ? X-rays. ? CT scan. ? MRI. ? Bone scan.  How is this treated? Treatment depends on the cause and type of infection. Antibiotic medicines are usually the first treatment for a bone or joint infection. Treatment with antibiotics may include:  Getting IV antibiotics. This may be done in a hospital at first. You may have to continue IV antibiotics at home for several weeks. You may also have to take antibiotics  by mouth for several weeks after that.  Taking more than one kind of antibiotic. Treatment may start with a type of antibiotic that works against many different bacteria (broad spectrumantibiotics). IV antibiotics may be changed if tests show that another type may work better.  Other treatments may include:  Draining fluid from the joint by placing a needle into it (aspiration).  Surgery to remove: ? Dead or dying tissue from a bone or joint. ? An infected  artificial joint. ? Infected plates or screws that were used to repair a broken bone.  Follow these instructions at home:  Take medicines only as directed by your health care provider.  Take your antibiotic medicine as directed by your health care provider. Finish the antibiotic even if you start to feel better.  Follow instructions from your health care provider about how to take IV antibiotics at home.  Ask your health care provider if you have any restrictions on your activities.  Keep all follow-up visits as directed by your health care provider. This is important. Contact a health care provider if:  You have a fever or chills.  You have redness, warmth, pain, or swelling that returns after treatment. Get help right away if:  You have rapid breathing or you have trouble breathing.  You have chest pain.  You cannot drink fluids or make urine.  The affected arm or leg swells, changes color, or turns blue. This information is not intended to replace advice given to you by your health care provider. Make sure you discuss any questions you have with your health care provider. Document Released: 05/16/2005 Document Revised: 10/22/2015 Document Reviewed: 05/14/2014 Elsevier Interactive Patient Education  [employer].

## 2018-01-11 ENCOUNTER — Other Ambulatory Visit: Payer: Self-pay | Admitting: Family Medicine

## 2018-01-17 ENCOUNTER — Ambulatory Visit (INDEPENDENT_AMBULATORY_CARE_PROVIDER_SITE_OTHER): Payer: Medicare Other

## 2018-01-17 DIAGNOSIS — Z1231 Encounter for screening mammogram for malignant neoplasm of breast: Secondary | ICD-10-CM | POA: Diagnosis not present

## 2018-01-21 NOTE — Progress Notes (Signed)
Call pt: normal mammogram. Follow up one year.

## 2018-01-23 ENCOUNTER — Telehealth: Payer: Self-pay | Admitting: Family Medicine

## 2018-01-23 NOTE — Telephone Encounter (Signed)
Diclofenac gel message sent to patient

## 2018-01-31 ENCOUNTER — Encounter: Payer: Self-pay | Admitting: Physician Assistant

## 2018-01-31 ENCOUNTER — Ambulatory Visit (INDEPENDENT_AMBULATORY_CARE_PROVIDER_SITE_OTHER): Payer: Medicare Other | Admitting: Physician Assistant

## 2018-01-31 VITALS — BP 129/74 | HR 75 | Ht 66.0 in | Wt 159.0 lb

## 2018-01-31 DIAGNOSIS — I10 Essential (primary) hypertension: Secondary | ICD-10-CM

## 2018-01-31 DIAGNOSIS — R5383 Other fatigue: Secondary | ICD-10-CM

## 2018-01-31 DIAGNOSIS — R7303 Prediabetes: Secondary | ICD-10-CM | POA: Diagnosis not present

## 2018-01-31 DIAGNOSIS — Z0000 Encounter for general adult medical examination without abnormal findings: Secondary | ICD-10-CM

## 2018-01-31 DIAGNOSIS — G4733 Obstructive sleep apnea (adult) (pediatric): Secondary | ICD-10-CM | POA: Diagnosis not present

## 2018-01-31 DIAGNOSIS — Z23 Encounter for immunization: Secondary | ICD-10-CM | POA: Diagnosis not present

## 2018-01-31 DIAGNOSIS — N951 Menopausal and female climacteric states: Secondary | ICD-10-CM | POA: Insufficient documentation

## 2018-01-31 DIAGNOSIS — E782 Mixed hyperlipidemia: Secondary | ICD-10-CM | POA: Diagnosis not present

## 2018-01-31 MED ORDER — ESTRADIOL 0.1 MG/GM VA CREA: 1.0000 | TOPICAL_CREAM | Freq: Every day | VAGINAL | 12 refills | Status: DC

## 2018-01-31 NOTE — Progress Notes (Signed)
Subjective:     Misty [name] is a 62 y.o. female and is here for a comprehensive physical exam. The patient reports problems - she feels like she has no energy. she noticed over the last 6 months to a year. she sleeps well with CPAP for 8 hours a night. she exercises 30 minutes a day. she does drink caffiene in afternoons to help keep her awake. she denies any hopelessness but she does feel very unmotivated to do things.    She does mention some vaginal dryness and what she could do. Denies any signifcant hot flashes.   Social History   Socioeconomic History  . Marital status: Married    Spouse name: Not on file  . Number of children: Not on file  . Years of education: Not on file  . Highest education level: Not on file  Occupational History  . Not on file  Social Needs  . Financial resource strain: Not on file  . Food insecurity:    Worry: Not on file    Inability: Not on file  . Transportation needs:    Medical: Not on file    Non-medical: Not on file  Tobacco Use  . Smoking status: Never Smoker  . Smokeless tobacco: Never Used  Substance and Sexual Activity  . Alcohol use: No    Frequency: Never  . Drug use: No  . Sexual activity: Not on file  Lifestyle  . Physical activity:    Days per week: Not on file    Minutes per session: Not on file  . Stress: Not on file  Relationships  . Social connections:    Talks on phone: Not on file    Gets together: Not on file    Attends religious service: Not on file    Active member of club or organization: Not on file    Attends meetings of clubs or organizations: Not on file    Relationship status: Not on file  . Intimate partner violence:    Fear of current or ex partner: Not on file    Emotionally abused: Not on file    Physically abused: Not on file    Forced sexual activity: Not on file  Other Topics Concern  . Not on file  Social History Narrative  . Not on file   Health Maintenance  Topic Date Due  . PAP SMEAR   09/09/2018  . MAMMOGRAM  01/18/2020  . TETANUS/TDAP  07/08/2020  . COLONOSCOPY  04/29/2021  . INFLUENZA VACCINE  Completed  . Hepatitis C Screening  Completed  . HIV Screening  Completed    The following portions of the patient's history were reviewed and updated as appropriate: allergies, current medications, past family history, past medical history, past social history, past surgical history and problem list.  Review of Systems Pertinent items noted in HPI and remainder of comprehensive ROS otherwise negative.   Objective:    BP 129/74   Pulse 75   Ht 5\' 6"  (1.676 m)   Wt 159 lb (72.1 kg)   BMI 25.66 kg/m  General appearance: alert, cooperative and appears stated age Head: Normocephalic, without obvious abnormality, atraumatic Eyes: conjunctivae/corneas clear. PERRL, EOM's intact. Fundi benign. Ears: normal TM's and external ear canals both ears Nose: Nares normal. Septum midline. Mucosa normal. No drainage or sinus tenderness. Throat: lips, mucosa, and tongue normal; teeth and gums normal Neck: no adenopathy, no carotid bruit, no JVD, supple, symmetrical, trachea midline and thyroid not enlarged, symmetric, no tenderness/mass/nodules  Back: symmetric, no curvature. ROM normal. No CVA tenderness. Lungs: clear to auscultation bilaterally Heart: regular rate and rhythm, S1, S2 normal, no murmur, click, rub or gallop Abdomen: soft, non-tender; bowel sounds normal; no masses,  no organomegaly Extremities: extremities normal, atraumatic, no cyanosis or edema Pulses: 2+ and symmetric Skin: Skin color, texture, turgor normal. No rashes or lesions Lymph nodes: Cervical, supraclavicular, and axillary nodes normal. Neurologic: Alert and oriented X 3, normal strength and tone. Normal symmetric reflexes. Normal coordination and gait    Assessment:    Healthy female exam.      Plan:    Marland KitchenMarland KitchenSerah was seen today for annual exam.  Diagnoses and all orders for this visit:  Routine  physical examination -     B12 and Folate Panel -     VITAMIN D 25 Hydroxy (Vit-D Deficiency, Fractures) -     Lipid Panel w/reflex Direct LDL -     COMPLETE METABOLIC PANEL WITH GFR -     TSH -     CBC with Differential/Platelet -     Ferritin -     Hemoglobin A1c -     Flu Vaccine QUAD 36+ mos IM  Essential hypertension, benign  Fatigue, unspecified type -     B12 and Folate Panel -     VITAMIN D 25 Hydroxy (Vit-D Deficiency, Fractures) -     Lipid Panel w/reflex Direct LDL -     COMPLETE METABOLIC PANEL WITH GFR -     TSH -     CBC with Differential/Platelet -     Ferritin -     Hemoglobin A1c  Vaginal dryness, menopausal -     estradiol (ESTRACE VAGINAL) 0.1 MG/GM vaginal cream; Place 1 Applicatorful vaginally at bedtime. For 2 weeks then decrease to 3 times a week.  OSA (obstructive sleep apnea)  Pre-diabetes -     COMPLETE METABOLIC PANEL WITH GFR -     Hemoglobin A1c  Mixed hyperlipidemia -     Lipid Panel w/reflex Direct LDL  Need for immunization against influenza -     Flu Vaccine QUAD 36+ mos IM    .Marland Kitchen Depression screen [facility] 2/9 01/31/2018 01/10/2018 12/05/2016  Decreased Interest 1 0 0  Down, Depressed, Hopeless 1 0 0  PHQ - 2 Score 2 0 0  Altered sleeping 0 - -  Tired, decreased energy 1 - -  Change in appetite 0 - -  Feeling bad or failure about yourself  0 - -  Trouble concentrating 0 - -  Moving slowly or fidgety/restless 0 - -  Suicidal thoughts 0 - -  PHQ-9 Score 3 - -  Difficult doing work/chores Somewhat difficult - -   .Marland Kitchen Discussed 150 minutes of exercise a week.  Encouraged vitamin D 1000 units and Calcium 1300mg  or 4 servings of dairy a day.  Mammogram up to date.  Colonoscopy up to date.  Pap smear up to date.  Flu shot given today.  Had zostovax. Declined shingrix today.   Unclear etiology of fatigue. Make sure OSA/CPAP mask has good suction. Labs ordered. certinaly could have some mild depression. Consider wellbutrin for fatigue and  mood if no other significant findings. Continue to get good sleep and exercise.   Pt brings up HRT. I would like to hold off for now. Estrace for vaginal symptoms given. faigue just became a problem I do not think this is necessarily linked to menopause.   See After Visit Summary for Counseling Recommendations

## 2018-01-31 NOTE — Patient Instructions (Signed)
Health Maintenance for Postmenopausal Women Menopause is a normal process in which your reproductive ability comes to an end. This process happens gradually over a span of months to years, usually between the ages of 22 and 9. Menopause is complete when you have missed 12 consecutive menstrual periods. It is important to talk with your health care provider about some of the most common conditions that affect postmenopausal women, such as heart disease, cancer, and bone loss (osteoporosis). Adopting a healthy lifestyle and getting preventive care can help to promote your health and wellness. Those actions can also lower your chances of developing some of these common conditions. What should I know about menopause? During menopause, you may experience a number of symptoms, such as:  Moderate-to-severe hot flashes.  Night sweats.  Decrease in sex drive.  Mood swings.  Headaches.  Tiredness.  Irritability.  Memory problems.  Insomnia.  Choosing to treat or not to treat menopausal changes is an individual decision that you make with your health care provider. What should I know about hormone replacement therapy and supplements? Hormone therapy products are effective for treating symptoms that are associated with menopause, such as hot flashes and night sweats. Hormone replacement carries certain risks, especially as you become older. If you are thinking about using estrogen or estrogen with progestin treatments, discuss the benefits and risks with your health care provider. What should I know about heart disease and stroke? Heart disease, heart attack, and stroke become more likely as you age. This may be due, in part, to the hormonal changes that your body experiences during menopause. These can affect how your body processes dietary fats, triglycerides, and cholesterol. Heart attack and stroke are both medical emergencies. There are many things that you can do to help prevent heart disease  and stroke:  Have your blood pressure checked at least every 1-2 years. High blood pressure causes heart disease and increases the risk of stroke.  If you are 53-22 years old, ask your health care provider if you should take aspirin to prevent a heart attack or a stroke.  Do not use any tobacco products, including cigarettes, chewing tobacco, or electronic cigarettes. If you need help quitting, ask your health care provider.  It is important to eat a healthy diet and maintain a healthy weight. ? Be sure to include plenty of vegetables, fruits, low-fat dairy products, and lean protein. ? Avoid eating foods that are high in solid fats, added sugars, or salt (sodium).  Get regular exercise. This is one of the most important things that you can do for your health. ? Try to exercise for at least 150 minutes each week. The type of exercise that you do should increase your heart rate and make you sweat. This is known as moderate-intensity exercise. ? Try to do strengthening exercises at least twice each week. Do these in addition to the moderate-intensity exercise.  Know your numbers.Ask your health care provider to check your cholesterol and your blood glucose. Continue to have your blood tested as directed by your health care provider.  What should I know about cancer screening? There are several types of cancer. Take the following steps to reduce your risk and to catch any cancer development as early as possible. Breast Cancer  Practice breast self-awareness. ? This means understanding how your breasts normally appear and feel. ? It also means doing regular breast self-exams. Let your health care provider know about any changes, no matter how small.  If you are 40  or older, have a clinician do a breast exam (clinical breast exam or CBE) every year. Depending on your age, family history, and medical history, it may be recommended that you also have a yearly breast X-ray (mammogram).  If you  have a family history of breast cancer, talk with your health care provider about genetic screening.  If you are at high risk for breast cancer, talk with your health care provider about having an MRI and a mammogram every year.  Breast cancer (BRCA) gene test is recommended for women who have family members with BRCA-related cancers. Results of the assessment will determine the need for genetic counseling and BRCA1 and for BRCA2 testing. BRCA-related cancers include these types: ? Breast. This occurs in males or females. ? Ovarian. ? Tubal. This may also be called fallopian tube cancer. ? Cancer of the abdominal or pelvic lining (peritoneal cancer). ? Prostate. ? Pancreatic.  Cervical, Uterine, and Ovarian Cancer Your health care provider may recommend that you be screened regularly for cancer of the pelvic organs. These include your ovaries, uterus, and vagina. This screening involves a pelvic exam, which includes checking for microscopic changes to the surface of your cervix (Pap test).  For women ages 21-65, health care providers may recommend a pelvic exam and a Pap test every three years. For women ages 79-65, they may recommend the Pap test and pelvic exam, combined with testing for human papilloma virus (HPV), every five years. Some types of HPV increase your risk of cervical cancer. Testing for HPV may also be done on women of any age who have unclear Pap test results.  Other health care providers may not recommend any screening for nonpregnant women who are considered low risk for pelvic cancer and have no symptoms. Ask your health care provider if a screening pelvic exam is right for you.  If you have had past treatment for cervical cancer or a condition that could lead to cancer, you need Pap tests and screening for cancer for at least 20 years after your treatment. If Pap tests have been discontinued for you, your risk factors (such as having a new sexual partner) need to be  reassessed to determine if you should start having screenings again. Some women have medical problems that increase the chance of getting cervical cancer. In these cases, your health care provider may recommend that you have screening and Pap tests more often.  If you have a family history of uterine cancer or ovarian cancer, talk with your health care provider about genetic screening.  If you have vaginal bleeding after reaching menopause, tell your health care provider.  There are currently no reliable tests available to screen for ovarian cancer.  Lung Cancer Lung cancer screening is recommended for adults 69-62 years old who are at high risk for lung cancer because of a history of smoking. A yearly low-dose CT scan of the lungs is recommended if you:  Currently smoke.  Have a history of at least 30 pack-years of smoking and you currently smoke or have quit within the past 15 years. A pack-year is smoking an average of one pack of cigarettes per day for one year.  Yearly screening should:  Continue until it has been 15 years since you quit.  Stop if you develop a health problem that would prevent you from having lung cancer treatment.  Colorectal Cancer  This type of cancer can be detected and can often be prevented.  Routine colorectal cancer screening usually begins at  age 42 and continues through age 45.  If you have risk factors for colon cancer, your health care provider may recommend that you be screened at an earlier age.  If you have a family history of colorectal cancer, talk with your health care provider about genetic screening.  Your health care provider may also recommend using home test kits to check for hidden blood in your stool.  A small camera at the end of a tube can be used to examine your colon directly (sigmoidoscopy or colonoscopy). This is done to check for the earliest forms of colorectal cancer.  Direct examination of the colon should be repeated every  5-10 years until age 71. However, if early forms of precancerous polyps or small growths are found or if you have a family history or genetic risk for colorectal cancer, you may need to be screened more often.  Skin Cancer  Check your skin from head to toe regularly.  Monitor any moles. Be sure to tell your health care provider: ? About any new moles or changes in moles, especially if there is a change in a mole's shape or color. ? If you have a mole that is larger than the size of a pencil eraser.  If any of your family members has a history of skin cancer, especially at a young age, talk with your health care provider about genetic screening.  Always use sunscreen. Apply sunscreen liberally and repeatedly throughout the day.  Whenever you are outside, protect yourself by wearing long sleeves, pants, a wide-brimmed hat, and sunglasses.  What should I know about osteoporosis? Osteoporosis is a condition in which bone destruction happens more quickly than new bone creation. After menopause, you may be at an increased risk for osteoporosis. To help prevent osteoporosis or the bone fractures that can happen because of osteoporosis, the following is recommended:  If you are 46-71 years old, get at least 1,000 mg of calcium and at least 600 mg of vitamin D per day.  If you are older than age 55 but younger than age 65, get at least 1,200 mg of calcium and at least 600 mg of vitamin D per day.  If you are older than age 54, get at least 1,200 mg of calcium and at least 800 mg of vitamin D per day.  Smoking and excessive alcohol intake increase the risk of osteoporosis. Eat foods that are rich in calcium and vitamin D, and do weight-bearing exercises several times each week as directed by your health care provider. What should I know about how menopause affects my mental health? Depression may occur at any age, but it is more common as you become older. Common symptoms of depression  include:  Low or sad mood.  Changes in sleep patterns.  Changes in appetite or eating patterns.  Feeling an overall lack of motivation or enjoyment of activities that you previously enjoyed.  Frequent crying spells.  Talk with your health care provider if you think that you are experiencing depression. What should I know about immunizations? It is important that you get and maintain your immunizations. These include:  Tetanus, diphtheria, and pertussis (Tdap) booster vaccine.  Influenza every year before the flu season begins.  Pneumonia vaccine.  Shingles vaccine.  Your health care provider may also recommend other immunizations. This information is not intended to replace advice given to you by your health care provider. Make sure you discuss any questions you have with your health care provider. Document Released: 07/08/2005  Document Revised: 12/04/2015 Document Reviewed: 02/17/2015 Elsevier Interactive Patient Education  2018 [employer]  

## 2018-02-01 LAB — CBC WITH DIFFERENTIAL/PLATELET
Basophils Absolute: 20 cells/uL (ref 0–200)
Basophils Relative: 0.6 %
Eosinophils Absolute: 71 cells/uL (ref 15–500)
Eosinophils Relative: 2.1 %
HCT: 40.1 % (ref 35.0–45.0)
Hemoglobin: 13.3 g/dL (ref 11.7–15.5)
Lymphs Abs: 1163 cells/uL (ref 850–3900)
MCH: 29 pg (ref 27.0–33.0)
MCHC: 33.2 g/dL (ref 32.0–36.0)
MCV: 87.4 fL (ref 80.0–100.0)
MPV: 12 fL (ref 7.5–12.5)
Monocytes Relative: 7.7 %
Neutro Abs: 1884 cells/uL (ref 1500–7800)
Neutrophils Relative %: 55.4 %
Platelets: 163 10*3/uL (ref 140–400)
RBC: 4.59 10*6/uL (ref 3.80–5.10)
RDW: 12.4 % (ref 11.0–15.0)
Total Lymphocyte: 34.2 %
WBC mixed population: 262 cells/uL (ref 200–950)
WBC: 3.4 10*3/uL — ABNORMAL LOW (ref 3.8–10.8)

## 2018-02-01 LAB — HEMOGLOBIN A1C
Hgb A1c MFr Bld: 5.7 % of total Hgb — ABNORMAL HIGH (ref <5.7)
Mean Plasma Glucose: 117 (calc)
eAG (mmol/L): 6.5 (calc)

## 2018-02-01 LAB — LIPID PANEL W/REFLEX DIRECT LDL
Cholesterol: 159 mg/dL (ref <200)
HDL: 52 mg/dL (ref >50)
LDL Cholesterol (Calc): 87 mg/dL (calc)
Non-HDL Cholesterol (Calc): 107 mg/dL (calc) (ref <130)
Total CHOL/HDL Ratio: 3.1 (calc) (ref <5.0)
Triglycerides: 106 mg/dL (ref <150)

## 2018-02-01 LAB — FERRITIN: Ferritin: 270 ng/mL (ref 16–288)

## 2018-02-01 LAB — B12 AND FOLATE PANEL
Folate: 21.5 ng/mL
Vitamin B-12: 541 pg/mL (ref 200–1100)

## 2018-02-01 LAB — VITAMIN D 25 HYDROXY (VIT D DEFICIENCY, FRACTURES): Vit D, 25-Hydroxy: 44 ng/mL (ref 30–100)

## 2018-02-01 LAB — COMPLETE METABOLIC PANEL WITH GFR
AG Ratio: 2.3 (calc) (ref 1.0–2.5)
ALT: 28 U/L (ref 6–29)
AST: 22 U/L (ref 10–35)
Albumin: 4.9 g/dL (ref 3.6–5.1)
Alkaline phosphatase (APISO): 76 U/L (ref 33–130)
BUN: 22 mg/dL (ref 7–25)
CO2: 27 mmol/L (ref 20–32)
Calcium: 10 mg/dL (ref 8.6–10.4)
Chloride: 105 mmol/L (ref 98–110)
Creat: 0.62 mg/dL (ref 0.50–0.99)
GFR, Est African American: 111 mL/min/{1.73_m2} (ref >=60)
GFR, Est Non African American: 96 mL/min/{1.73_m2} (ref >=60)
Globulin: 2.1 g/dL (calc) (ref 1.9–3.7)
Glucose, Bld: 107 mg/dL — ABNORMAL HIGH (ref 65–99)
Potassium: 4.1 mmol/L (ref 3.5–5.3)
Sodium: 141 mmol/L (ref 135–146)
Total Bilirubin: 0.9 mg/dL (ref 0.2–1.2)
Total Protein: 7 g/dL (ref 6.1–8.1)

## 2018-02-01 LAB — TSH: TSH: 1.81 mIU/L (ref 0.40–4.50)

## 2018-02-01 NOTE — Progress Notes (Signed)
Call pt: b12 looks great. Vitamin D is great. Cholesterol wonderful. Thyroid good. Iron levels great. Still in pre-diabetes range but holding stable. No metabolic cause for fatigue. We talked about a trying a medication for your mood to help motivate you more would you like to try?

## 2018-02-05 ENCOUNTER — Other Ambulatory Visit: Payer: Self-pay | Admitting: Physician Assistant

## 2018-02-12 ENCOUNTER — Encounter: Payer: Self-pay | Admitting: Family Medicine

## 2018-02-12 ENCOUNTER — Ambulatory Visit (INDEPENDENT_AMBULATORY_CARE_PROVIDER_SITE_OTHER): Payer: Medicare Other | Admitting: Family Medicine

## 2018-02-12 VITALS — BP 133/62 | HR 77 | Ht 65.0 in | Wt 160.0 lb

## 2018-02-12 DIAGNOSIS — M79644 Pain in right finger(s): Secondary | ICD-10-CM | POA: Diagnosis not present

## 2018-02-12 NOTE — Patient Instructions (Signed)
Thank you for coming in today. Lets give it a month.  If pain comes right back recheck in clinic.  Let me know how is doing.  If it say the same and gets annoying we will do labs and if no rheumatology issue think about taking with a hand surgeon.  Continue the topical gel as needed.

## 2018-02-12 NOTE — Progress Notes (Signed)
Misty [name] is a 62 y.o. female who presents to [facility] today for finger [name]   She has a history of [name] in her right 3rd digit. 4 weeks ago she was started on  4 week course of doxycycline for suspected osteomyelitis or septic joint. She says she feels a bit better now than she did a month ago. She says she still has some swelling and redness. The [name] is the worst when she brushes it up against something and when she is using it. She denies fevers/chills. She says she is glad to off of the antibiotics as she had some nausea when taking them.     ROS:  As above  Exam:  BP 133/62   Pulse 77   Ht 5\' 5"  (1.651 m)   Wt 160 lb (72.6 kg)   BMI 26.63 kg/m  General: Well Developed, well nourished, and in no acute distress.  Neuro/Psych: Alert and oriented x3, extra-ocular muscles intact, able to move all 4 extremities, sensation grossly intact. Skin: Warm and dry, no rashes noted.  Respiratory: Not using accessory muscles, speaking in full sentences, trachea midline.  Cardiovascular: Pulses palpable, no extremity edema. Abdomen: Does not appear distended. Right 3rd digit: swollen DIP with mild erythema.  Mildly tender to palpation. Not warm  Limited ROM.  Decreased capillary refill. 2+ radial pulse    Lab and Radiology Results  CLINICAL DATA:  Right middle finger [name]  EXAM: RIGHT MIDDLE FINGER 2+V  COMPARISON:  None.  FINDINGS: No acute fracture or dislocation. 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 No aggressive osseous lesion. Soft tissues are normal.  IMPRESSION: 1. 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 2. 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   Electronically Signed   By: Kathreen Devoid   On: 01/10/2018 16:06  I personally (independently) visualized and performed the interpretation  of the images attached in this note.  Assessment and Plan: 62 y.o. female with finger [name]   Her [name] in her right 3rd digit is likely not infectious as she only mildly improved with antibiotics. The plan will be to try one month off of the antibiotics and see how she feels. She should continue to use the Voltaren gel in the meantime. Her x ray shows severe osteoarthritis of her DIP and if her [name] does not improve in 4 weeks it is reasonable for her to see an orthopaedic hand surgeon. She has been seen by Dr Amedeo Plenty in the past.     Historical information moved to improve visibility of documentation.  Past Medical History:  Diagnosis Date  . Arthritis   . Hyperlipidemia   . Osteopenia determined by x-ray 12/07/2016   DEXA scan July 2018   No past surgical history on file. Social History   Tobacco Use  . Smoking status: Never Smoker  . Smokeless tobacco: Never Used  Substance Use Topics  . Alcohol use: No    Frequency: Never   family history includes Cancer in her father and maternal grandfather; Diabetes in her father; Hyperlipidemia in her mother; Stroke in her maternal grandfather.  Medications: Current Outpatient Medications  Medication Sig Dispense Refill  . AMBULATORY NON FORMULARY MEDICATION Continuous positive airway pressure (CPAP) machine auto-titrate from 4-20 cm of H2O pressure, with all supplemental supplies as needed. 1 each 0  . Calcium Carbonate-Vitamin D (CALCIUM + D PO) Take 600 mg by  mouth 2 (two) times daily.    . diclofenac sodium (VOLTAREN) 1 % GEL Apply 2 g topically 4 (four) times daily. To affected joint. 100 g 11  . estradiol (ESTRACE VAGINAL) 0.1 MG/GM vaginal cream Place 1 Applicatorful vaginally at bedtime. For 2 weeks then decrease to 3 times a week. 42.5 g 12  . famotidine (PEPCID) 20 MG tablet Take 20 mg 2 (two) times daily by mouth.    . fluticasone (FLONASE) 50 MCG/ACT nasal spray SPRAY 2 SPRAYS INTO EACH NOSTRIL EVERY DAY 16 g 12  . magnesium  oxide (MAG-OX) 400 MG tablet Take 400 mg by mouth daily.    . Multiple Vitamin (MULTIVITAMIN) tablet Take 1 tablet by mouth daily.    . Omega-3 Fatty Acids (FISH OIL) 1200 MG CAPS Take by mouth daily.    Marland Kitchen PROAIR HFA 108 (90 Base) MCG/ACT inhaler INHALE 2 PUFFS INTO THE LUNGS EVERY 6 (SIX) HOURS AS NEEDED FOR WHEEZING OR SHORTNESS OF BREATH. 8.5 Inhaler 0  . simvastatin (ZOCOR) 40 MG tablet TAKE 1 TABLET (40 MG TOTAL) BY MOUTH DAILY AT 6 PM. MUST HAVE OV FOR FURTHER REFILLS 30 tablet 0  . VITAMIN E PO Take by mouth daily.     No current facility-administered medications for this visit.    No Known Allergies    Discussed warning signs or symptoms. Please see discharge instructions. Patient expresses understanding.  I personally was present and performed or re-performed the history, physical exam and medical decision-making activities of this service and have verified that the service and findings are accurately documented in the student's note. ___________________________________________ Lynne Leader M.D., ABFM., CAQSM. Primary Care and Sports Medicine Adjunct Instructor of [employer]

## 2018-02-13 NOTE — Progress Notes (Signed)
Pt has seen results on MyChart and message also sent for patient to call back if any questions.

## 2018-03-07 ENCOUNTER — Other Ambulatory Visit: Payer: Self-pay | Admitting: Physician Assistant

## 2018-03-09 ENCOUNTER — Other Ambulatory Visit: Payer: Self-pay | Admitting: Physician Assistant

## 2018-03-09 MED ORDER — SIMVASTATIN 40 MG PO TABS: 40.0000 mg | ORAL_TABLET | Freq: Every day | ORAL | 0 refills | Status: DC

## 2018-04-03 ENCOUNTER — Encounter: Payer: Self-pay | Admitting: Physician Assistant

## 2018-04-11 NOTE — Telephone Encounter (Signed)
Thanks you are the best!

## 2018-04-11 NOTE — Telephone Encounter (Signed)
Called [facility], spoke with Crystal. Added the following diagnosis codes for lab work from 02/26/2018:  I10, R73.03, E78.2, G47.33, Z78.0, R53.83, M85.00, Z86.39.  Will get info to Lacretia Nicks for office visit code for 26-Feb-2018 to be changed from 99396 to [id number].

## 2018-04-13 ENCOUNTER — Encounter: Payer: Self-pay | Admitting: *Deleted

## 2018-04-13 ENCOUNTER — Other Ambulatory Visit: Payer: Self-pay

## 2018-04-13 ENCOUNTER — Emergency Department (INDEPENDENT_AMBULATORY_CARE_PROVIDER_SITE_OTHER)
Admission: EM | Admit: 2018-04-13 | Discharge: 2018-04-13 | Disposition: A | Discharge status: Home / Self Care | Payer: Medicare Other | Source: Home / Self Care | Attending: Family Medicine | Admitting: Family Medicine

## 2018-04-13 DIAGNOSIS — H9312 Tinnitus, left ear: Secondary | ICD-10-CM

## 2018-04-13 DIAGNOSIS — J028 Acute pharyngitis due to other specified organisms: Secondary | ICD-10-CM

## 2018-04-13 DIAGNOSIS — B9789 Other viral agents as the cause of diseases classified elsewhere: Secondary | ICD-10-CM

## 2018-04-13 DIAGNOSIS — H938X3 Other specified disorders of ear, bilateral: Secondary | ICD-10-CM

## 2018-04-13 DIAGNOSIS — R42 Dizziness and giddiness: Secondary | ICD-10-CM

## 2018-04-13 MED ORDER — MECLIZINE HCL 25 MG PO TABS: 25.0000 mg | ORAL_TABLET | Freq: Three times a day (TID) | ORAL | 0 refills | Status: DC | PRN

## 2018-04-13 NOTE — Discharge Instructions (Signed)
°  Antivert (meclizine) is a medication to help with dizziness and nausea related to vertigo.  This medication can cause drowsiness. Do not operate heavy machinery or drive while taking.   Please call to schedule a follow up appointment with family medicine in 1 week if not improving.

## 2018-04-13 NOTE — ED Provider Notes (Signed)
[facility]    CSN: [id number] Arrival date & time: 04/13/18  7858     History   Chief Complaint Chief Complaint  Patient presents with  . Tinnitus  . Cough    HPI Misty [name] is a 62 y.o. female.   Pt is deaf, language interpreter used.  HPI Misty [name] is a 62 y.o. female presenting to UC with c/o ringing in ears, Left worse than right, associated generalized HA, nausea and dizziness described as swaying.  Symptoms started after getting off a week long cruise on 03/31/18.  She has tried mucinex for her headache with mild relief. No prior hx of vertigo or seasickness. She has been on prior cruises w/o these symptoms. She has not tried any Dramamine or other seasickness/antiemetics. Denies fever, chills, vomiting or diarrhea.    Past Medical History:  Diagnosis Date  . Arthritis   . Hyperlipidemia   . Osteopenia determined by x-ray 12/07/2016   DEXA scan July 2018    Patient Active Problem List   Diagnosis Date Noted  . Vaginal dryness, menopausal 01/31/2018  . Dyspnea 04/13/2017  . GERD (gastroesophageal reflux disease) 04/13/2017  . OSA (obstructive sleep apnea) 01/02/2017  . Osteopenia determined by x-ray 12/07/2016  . Fatigue 12/05/2016  . Gluteal tendonitis 07/25/2016  . Screening for breast cancer 07/25/2016  . Trigger finger, right 05/17/2016  . Polyp, cervix 09/09/2015  . Midline low back pain without sciatica 09/09/2015  . Pre-diabetes 12/27/2013  . Essential hypertension, benign 12/16/2013  . Hyperlipidemia 12/16/2013  . Frequent headaches 12/16/2013  . Hx of extrinsic asthma 12/16/2013  . 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 No pertinent surgical history.  OB History   None      Home Medications    Prior to Admission medications   Medication Sig Start Date End Date Taking? Authorizing Provider  AMBULATORY NON FORMULARY MEDICATION Continuous positive airway pressure (CPAP) machine auto-titrate from 4-20 cm  of H2O pressure, with all supplemental supplies as needed. 01/13/17   Gregor Hams, MD  Calcium Carbonate-Vitamin D (CALCIUM + D PO) Take 600 mg by mouth 2 (two) times daily.    [provider]  estradiol (ESTRACE VAGINAL) 0.1 MG/GM vaginal cream Place 1 Applicatorful vaginally at bedtime. For 2 weeks then decrease to 3 times a week. 01/31/18   Breeback, Jade L, PA-C  fluticasone (FLONASE) 50 MCG/ACT nasal spray SPRAY 2 SPRAYS INTO EACH NOSTRIL EVERY DAY 12/26/17   Gregor Hams, MD  magnesium oxide (MAG-OX) 400 MG tablet Take 400 mg by mouth daily.    [provider]  meclizine (ANTIVERT) 25 MG tablet Take 1 tablet (25 mg total) by mouth 3 (three) times daily as needed for dizziness. 04/13/18   Noe Gens, PA-C  Multiple Vitamin (MULTIVITAMIN) tablet Take 1 tablet by mouth daily.    [provider]  Omega-3 Fatty Acids (FISH OIL) 1200 MG CAPS Take by mouth daily.    [provider]  PROAIR HFA 108 (90 Base) MCG/ACT inhaler INHALE 2 PUFFS INTO THE LUNGS EVERY 6 (SIX) HOURS AS NEEDED FOR WHEEZING OR SHORTNESS OF BREATH. 11/03/15   Breeback, Jade L, PA-C  simvastatin (ZOCOR) 40 MG tablet Take 1 tablet (40 mg total) by mouth daily at 6 PM. Must have OV for further refills 03/09/18   Breeback, Jade L, PA-C  VITAMIN E PO Take by mouth daily.    [provider]    Family History Family History  Problem Relation  Age of Onset  . Hyperlipidemia Mother   . Cancer Father        prostate  . Diabetes Father   . Cancer Maternal Grandfather        colon cancer  . Stroke Maternal Grandfather     Social History Social History   Tobacco Use  . Smoking status: Never Smoker  . Smokeless tobacco: Never Used  Substance Use Topics  . Alcohol use: No    Frequency: Never  . Drug use: No     Allergies   Patient has no known allergies.   Review of Systems Review of Systems  Constitutional: Negative for chills and fever.  HENT: Positive for congestion, ear  pain and tinnitus. Negative for sinus pressure and sinus pain.   Respiratory: Positive for cough (minimal).   Gastrointestinal: Positive for nausea. Negative for diarrhea and vomiting.  Musculoskeletal: Negative for neck pain and neck stiffness.  Neurological: Positive for dizziness, light-headedness and headaches. Negative for syncope, weakness and numbness.     Physical Exam Triage Vital Signs ED Triage Vitals  Enc Vitals Group     BP 04/13/18 0948 (!) 163/78     Pulse Rate 04/13/18 0948 80     Resp 04/13/18 0948 18     Temp 04/13/18 0948 98.3 F (36.8 C)     Temp Source 04/13/18 0948 Oral     SpO2 04/13/18 0948 98 %     Weight 04/13/18 0949 157 lb (71.2 kg)     Height --      Head Circumference --      Peak Flow --      Pain Score 04/13/18 0949 0     Pain Loc --      Pain Edu? --      Excl. in [place]? --    No data found.  Updated Vital Signs BP (!) 163/78 (BP Location: Right Arm)   Pulse 80   Temp 98.3 F (36.8 C) (Oral)   Resp 18   Wt 157 lb (71.2 kg)   SpO2 98%   BMI 26.13 kg/m   Visual Acuity Right Eye Distance:   Left Eye Distance:   Bilateral Distance:    Right Eye Near:   Left Eye Near:    Bilateral Near:     Physical Exam  Constitutional: She is oriented to person, place, and time. She appears well-developed and well-nourished. No distress.  HENT:  Head: Normocephalic and atraumatic.  Right Ear: Tympanic membrane normal.  Left Ear: Tympanic membrane normal.  Nose: Nose normal. Right sinus exhibits no maxillary sinus tenderness and no frontal sinus tenderness. Left sinus exhibits no maxillary sinus tenderness and no frontal sinus tenderness.  Mouth/Throat: Uvula is midline, oropharynx is clear and moist and mucous membranes are normal.  Eyes: Pupils are equal, round, and reactive to light. Conjunctivae and EOM are normal. Right eye exhibits no discharge.  Neck: Normal range of motion. Neck supple.  Cardiovascular: Normal rate and regular rhythm.    Pulmonary/Chest: Effort normal and breath sounds normal. No stridor. No respiratory distress. She has no wheezes. She has no rales.  Musculoskeletal: Normal range of motion.  Neurological: She is alert and oriented to person, place, and time. No cranial nerve deficit. Coordination normal.  Skin: Skin is warm and dry. Capillary refill takes less than 2 seconds. She is not diaphoretic.  Psychiatric: She has a normal mood and affect. Her behavior is normal.  Nursing note and vitals reviewed.    UC Treatments /  Results  Labs (all labs ordered are listed, but only abnormal results are displayed) Labs Reviewed - No data to display  EKG None  Radiology No results found.  Procedures Procedures (including critical care time)  Medications Ordered in UC Medications - No data to display  Initial Impression / Assessment and Plan / UC Course  I have reviewed the triage vital signs and the nursing notes.  Pertinent labs & imaging results that were available during my care of the patient were reviewed by me and considered in my medical decision making (see chart for details).     Normal exam. Doubt CVA. Symptoms possibly due to mild dehydration vs viral URI/congestion. Will tx with meclizine and encouraged fluids and rest. Resource guide for vertigo PT provided in case symptoms are improving by next week. Discussed symptoms that warrant emergent care in the ED.  Final Clinical Impressions(s) / UC Diagnoses   Final diagnoses:  Viral sore throat  Dizziness  Ear fullness, bilateral  Tinnitus, left     Discharge Instructions      Antivert (meclizine) is a medication to help with dizziness and nausea related to vertigo.  This medication can cause drowsiness. Do not operate heavy machinery or drive while taking.   Please call to schedule a follow up appointment with family medicine in 1 week if not improving.     ED Prescriptions    Medication Sig Dispense Auth. Provider    meclizine (ANTIVERT) 25 MG tablet Take 1 tablet (25 mg total) by mouth 3 (three) times daily as needed for dizziness. 30 tablet Noe Gens, PA-C     Controlled Substance Prescriptions  Controlled Substance Registry consulted? Not Applicable   Tyrell Antonio 04/13/18 1248

## 2018-04-13 NOTE — ED Triage Notes (Signed)
Pt c/o ears ringing, HA, nausea and come dizziness x 03/31/18 intermittently after returning from a cruise 03/31/2018. She also developed a cough last week.

## 2018-04-15 ENCOUNTER — Telehealth: Payer: Self-pay | Admitting: Emergency Medicine

## 2018-04-15 NOTE — Telephone Encounter (Signed)
Attempted to contact x1. Left message to return call.

## 2018-05-07 ENCOUNTER — Encounter: Payer: Self-pay | Admitting: Physician Assistant

## 2018-05-09 ENCOUNTER — Telehealth: Payer: Self-pay

## 2018-05-09 NOTE — Telephone Encounter (Signed)
[employer] [facility] [employer] contacted our office again through fax in need of the office notes from patient's appointment on 12/27/2016 for approval for her CPAP machine. I have faxed over the requested documents. No further questions or concerns at this time.

## 2018-05-31 DIAGNOSIS — G4733 Obstructive sleep apnea (adult) (pediatric): Secondary | ICD-10-CM | POA: Diagnosis not present

## 2018-06-07 ENCOUNTER — Other Ambulatory Visit: Payer: Self-pay | Admitting: Physician Assistant

## 2018-08-02 IMAGING — NM NM MISC PROCEDURE
6 series · 36 of 36 positions shown · non-contrast
Comparison: none

[Series 1: wbr_s-proj_st stress-gsp · 6.40mm/px · 6 of 512 frames shown]
[frame 43/512]
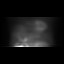
[frame 128/512]
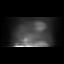
[frame 214/512]
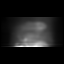
[frame 299/512]
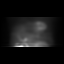
[frame 384/512]
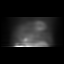
[frame 470/512]
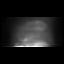

[Series 1: wbr_r-proj_st rest · 6.40mm/px · 6 of 64 frames shown]
[frame 6/64]
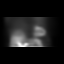
[frame 16/64]
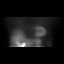
[frame 27/64]
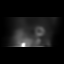
[frame 38/64]
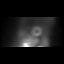
[frame 48/64]
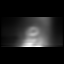
[frame 59/64]
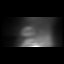

[Series 1: wbr_s-proj_st stress-sum-em · 6.40mm/px · 6 of 64 frames shown]
[frame 6/64]
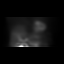
[frame 16/64]
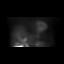
[frame 27/64]
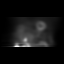
[frame 38/64]
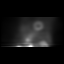
[frame 48/64]
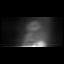
[frame 59/64]
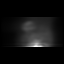

[Series 1: stress-sum-em · 6.40mm/px · 6 of 64 frames shown]
[frame 6/64]
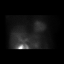
[frame 16/64]
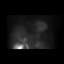
[frame 27/64]
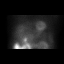
[frame 38/64]
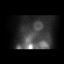
[frame 48/64]
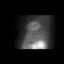
[frame 59/64]
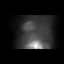

[Series 1: stress-gsp · 6.40mm/px · 6 of 512 frames shown]
[frame 43/512]
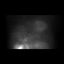
[frame 128/512]
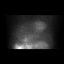
[frame 214/512]
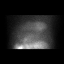
[frame 299/512]
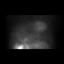
[frame 384/512]
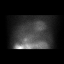
[frame 470/512]
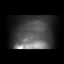

[Series 1: rest · 6.40mm/px · 6 of 64 frames shown]
[frame 6/64]
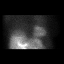
[frame 16/64]
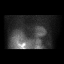
[frame 27/64]
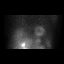
[frame 38/64]
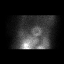
[frame 48/64]
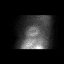
[frame 59/64]
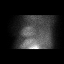

[36 of 36 positions shown; findings below may reference images not displayed]

Canned report from images found in remote index.

Refer to host system for actual result text.

## 2018-08-10 ENCOUNTER — Other Ambulatory Visit: Payer: Self-pay | Admitting: Physician Assistant

## 2018-08-14 ENCOUNTER — Other Ambulatory Visit: Payer: Self-pay | Admitting: Pulmonary Disease

## 2018-09-11 ENCOUNTER — Other Ambulatory Visit: Payer: Self-pay | Admitting: Physician Assistant

## 2018-11-28 ENCOUNTER — Other Ambulatory Visit: Payer: Self-pay | Admitting: Physician Assistant

## 2019-01-24 ENCOUNTER — Ambulatory Visit (INDEPENDENT_AMBULATORY_CARE_PROVIDER_SITE_OTHER): Payer: Medicare Other | Admitting: Family Medicine

## 2019-01-24 DIAGNOSIS — Z23 Encounter for immunization: Secondary | ICD-10-CM

## 2019-02-08 ENCOUNTER — Encounter: Payer: Self-pay | Admitting: Physician Assistant

## 2019-02-11 ENCOUNTER — Ambulatory Visit (INDEPENDENT_AMBULATORY_CARE_PROVIDER_SITE_OTHER): Payer: Medicare Other | Admitting: Physician Assistant

## 2019-02-11 ENCOUNTER — Other Ambulatory Visit: Payer: Self-pay

## 2019-02-11 VITALS — BP 138/71 | HR 62 | Ht 66.0 in | Wt 151.0 lb

## 2019-02-11 DIAGNOSIS — R339 Retention of urine, unspecified: Secondary | ICD-10-CM

## 2019-02-11 DIAGNOSIS — N8110 Cystocele, unspecified: Secondary | ICD-10-CM

## 2019-02-11 DIAGNOSIS — Z1329 Encounter for screening for other suspected endocrine disorder: Secondary | ICD-10-CM

## 2019-02-11 DIAGNOSIS — M545 Low back pain, unspecified: Secondary | ICD-10-CM

## 2019-02-11 DIAGNOSIS — Z131 Encounter for screening for diabetes mellitus: Secondary | ICD-10-CM | POA: Diagnosis not present

## 2019-02-11 DIAGNOSIS — R7303 Prediabetes: Secondary | ICD-10-CM

## 2019-02-11 DIAGNOSIS — Z23 Encounter for immunization: Secondary | ICD-10-CM

## 2019-02-11 DIAGNOSIS — Z13220 Encounter for screening for lipoid disorders: Secondary | ICD-10-CM

## 2019-02-11 DIAGNOSIS — N3001 Acute cystitis with hematuria: Secondary | ICD-10-CM

## 2019-02-11 DIAGNOSIS — K5900 Constipation, unspecified: Secondary | ICD-10-CM | POA: Diagnosis not present

## 2019-02-11 DIAGNOSIS — R350 Frequency of micturition: Secondary | ICD-10-CM

## 2019-02-11 LAB — POCT URINALYSIS DIPSTICK
Bilirubin, UA: NEGATIVE
Glucose, UA: NEGATIVE
Ketones, UA: NEGATIVE
Nitrite, UA: NEGATIVE
Protein, UA: NEGATIVE
Spec Grav, UA: 1.01 (ref 1.010–1.025)
Urobilinogen, UA: 0.2 E.U./dL
pH, UA: 7 (ref 5.0–8.0)

## 2019-02-11 MED ORDER — PHENAZOPYRIDINE HCL 200 MG PO TABS: 200.0000 mg | ORAL_TABLET | Freq: Three times a day (TID) | ORAL | 0 refills | Status: AC

## 2019-02-11 MED ORDER — AMBULATORY NON FORMULARY MEDICATION: 0 refills | Status: DC

## 2019-02-11 MED ORDER — NITROFURANTOIN MONOHYD MACRO 100 MG PO CAPS
100.0000 mg | ORAL_CAPSULE | Freq: Two times a day (BID) | ORAL | 0 refills | Status: DC
Start: 2019-02-11 — End: 2019-04-10

## 2019-02-11 NOTE — Progress Notes (Signed)
Subjective:    Patient ID: Misty [name], female    DOB: [birth year], 63 y.o.   MRN: [id number]  HPI  Pt is a 63 yo hearing impaired female with HTN, GERD, pre diabetes who presents to the clinic with lower vaginal pressure, urinary retention for the last week. She has sign language interpreter with her. She feels like she has to urinate but can't always go a lot but then feels like she has to urinate again. She admits to being constipated. miralax does help. benefiber makes her bloated. She is having some low back tightness but no flank pain. She is a little nauseated. Pt denies any fever, chills, body aches. She has no sick contacts. She denies any vaginal discharge or odor. She denies any itching. She has no new sexual partners. She does admit to feeling like something is going to "fall out vaginally".   .. Active Ambulatory Problems    Diagnosis Date Noted  . 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 Essential hypertension, benign 12/16/2013  . Hyperlipidemia 12/16/2013  . Frequent headaches 12/16/2013  . Hx of extrinsic asthma 12/16/2013  . Pre-diabetes 12/27/2013  . Polyp, cervix 09/09/2015  . Midline low back pain without sciatica 09/09/2015  . Trigger finger, right 05/17/2016  . Gluteal tendonitis 07/25/2016  . Screening for breast cancer 07/25/2016  . Fatigue 12/05/2016  . Osteopenia determined by x-ray 12/07/2016  . OSA (obstructive sleep apnea) 01/02/2017  . Dyspnea 04/13/2017  . GERD (gastroesophageal reflux disease) 04/13/2017  . Vaginal dryness, menopausal 01/31/2018  . Female cystocele 02/12/2019  . Constipation 02/12/2019   Resolved Ambulatory Problems    Diagnosis Date Noted  . Finger pain, right 05/17/2016   Past Medical History:  Diagnosis Date  . Arthritis                                                         Review of Systems  All other systems reviewed and are negative.      Objective:   Physical Exam Vitals signs reviewed.  Constitutional:       Appearance: Normal appearance.  HENT:     Head: Normocephalic.  Cardiovascular:     Rate and Rhythm: Regular rhythm. Tachycardia present.  Pulmonary:     Effort: Pulmonary effort is normal.     Breath sounds: Normal breath sounds.  Abdominal:     General: Bowel sounds are normal. There is no distension.     Palpations: Abdomen is soft.     Tenderness: There is abdominal tenderness. There is guarding. There is no right CVA tenderness, left CVA tenderness or rebound.     Hernia: No hernia is present.     Comments: Bilateral lower quadrant guarding more left than right.   Genitourinary:    General: Normal vulva.     Vagina: No vaginal discharge.     Comments: Grade 1 cystocele. Neurological:     General: No focal deficit present.     Mental Status: She is alert and oriented to person, place, and time.  Psychiatric:        Mood and Affect: Mood normal.           Assessment & Plan:  Marland KitchenMarland KitchenDemia was seen today for back pain and urinary retention.  Diagnoses and all orders for this visit:  Acute cystitis with  hematuria -     nitrofurantoin, macrocrystal-monohydrate, (MACROBID) 100 MG capsule; Take 1 capsule (100 mg total) by mouth 2 (two) times daily. -     phenazopyridine (PYRIDIUM) 200 MG tablet; Take 1 tablet (200 mg total) by mouth 3 (three) times daily for 2 days.  Urinary retention -     POCT Urinalysis Dipstick -     Urine Culture -     nitrofurantoin, macrocrystal-monohydrate, (MACROBID) 100 MG capsule; Take 1 capsule (100 mg total) by mouth 2 (two) times daily. -     phenazopyridine (PYRIDIUM) 200 MG tablet; Take 1 tablet (200 mg total) by mouth 3 (three) times daily for 2 days.  Urinary frequency -     POCT Urinalysis Dipstick -     Urine Culture -     nitrofurantoin, macrocrystal-monohydrate, (MACROBID) 100 MG capsule; Take 1 capsule (100 mg total) by mouth 2 (two) times daily. -     phenazopyridine (PYRIDIUM) 200 MG tablet; Take 1 tablet (200 mg total) by mouth 3  (three) times daily for 2 days.  Acute bilateral low back pain without sciatica -     POCT Urinalysis Dipstick -     Urine Culture -     nitrofurantoin, macrocrystal-monohydrate, (MACROBID) 100 MG capsule; Take 1 capsule (100 mg total) by mouth 2 (two) times daily. -     phenazopyridine (PYRIDIUM) 200 MG tablet; Take 1 tablet (200 mg total) by mouth 3 (three) times daily for 2 days.  Screening for diabetes mellitus -     COMPLETE METABOLIC PANEL WITH GFR  Screening for lipid disorders -     Lipid Panel w/reflex Direct LDL  Pre-diabetes -     Hemoglobin A1c  Thyroid disorder screening -     TSH  Need for shingles vaccine -     AMBULATORY NON FORMULARY MEDICATION; shingrx 2 doses to prevent shingles.  Female cystocele  Constipation, unspecified constipation type   .Marland Kitchen Results for orders placed or performed in visit on 02/11/19  POCT Urinalysis Dipstick  Result Value Ref Range   Color, UA yellow    Clarity, UA clear    Glucose, UA Negative Negative   Bilirubin, UA negative    Ketones, UA negative    Spec Grav, UA 1.010 1.010 - 1.025   Blood, UA trace-intact    pH, UA 7.0 5.0 - 8.0   Protein, UA Negative Negative   Urobilinogen, UA 0.2 0.2 or 1.0 E.U./dL   Nitrite, UA negative    Leukocytes, UA Moderate (2+) (A) Negative   Appearance     Odor     UA positive for leukocytes and trace blood. Not enough urine for culture. Treated empirically with Macrobid and pyridium.  I did see small cystocele after speculum exam it improved.  Symptomatic care discussed.  Gave HO on cystocele. Encouraged kegals. Follow up as needed or if symptoms worsen or do not improve.    Discussed constipation and probiotics and as needed miralax.   Needs fasting labs. Ordered today.  shingrix rx given.

## 2019-02-11 NOTE — Patient Instructions (Addendum)

## 2019-02-12 ENCOUNTER — Encounter: Payer: Self-pay | Admitting: Physician Assistant

## 2019-02-12 DIAGNOSIS — K5900 Constipation, unspecified: Secondary | ICD-10-CM | POA: Insufficient documentation

## 2019-02-12 DIAGNOSIS — N8110 Cystocele, unspecified: Secondary | ICD-10-CM | POA: Insufficient documentation

## 2019-02-13 LAB — URINE CULTURE
MICRO NUMBER:: 877954
Result:: NO GROWTH
SPECIMEN QUALITY:: ADEQUATE

## 2019-02-14 NOTE — Progress Notes (Signed)
No growth on urine culture to represent infection. May stop antibiotic. How are symptoms today?

## 2019-02-18 DIAGNOSIS — Z13220 Encounter for screening for lipoid disorders: Secondary | ICD-10-CM | POA: Diagnosis not present

## 2019-02-18 DIAGNOSIS — Z1329 Encounter for screening for other suspected endocrine disorder: Secondary | ICD-10-CM | POA: Diagnosis not present

## 2019-02-18 DIAGNOSIS — Z131 Encounter for screening for diabetes mellitus: Secondary | ICD-10-CM | POA: Diagnosis not present

## 2019-02-18 DIAGNOSIS — R7303 Prediabetes: Secondary | ICD-10-CM | POA: Diagnosis not present

## 2019-02-19 LAB — COMPLETE METABOLIC PANEL WITH GFR
AG Ratio: 2.2 (calc) (ref 1.0–2.5)
ALT: 29 U/L (ref 6–29)
AST: 23 U/L (ref 10–35)
Albumin: 4.8 g/dL (ref 3.6–5.1)
Alkaline phosphatase (APISO): 74 U/L (ref 37–153)
BUN: 16 mg/dL (ref 7–25)
CO2: 27 mmol/L (ref 20–32)
Calcium: 9.8 mg/dL (ref 8.6–10.4)
Chloride: 108 mmol/L (ref 98–110)
Creat: 0.63 mg/dL (ref 0.50–0.99)
GFR, Est African American: 110 mL/min/{1.73_m2} (ref >=60)
GFR, Est Non African American: 95 mL/min/{1.73_m2} (ref >=60)
Globulin: 2.2 g/dL (calc) (ref 1.9–3.7)
Glucose, Bld: 112 mg/dL — ABNORMAL HIGH (ref 65–99)
Potassium: 4.1 mmol/L (ref 3.5–5.3)
Sodium: 144 mmol/L (ref 135–146)
Total Bilirubin: 0.9 mg/dL (ref 0.2–1.2)
Total Protein: 7 g/dL (ref 6.1–8.1)

## 2019-02-19 LAB — HEMOGLOBIN A1C
Hgb A1c MFr Bld: 5.8 % of total Hgb — ABNORMAL HIGH (ref <5.7)
Mean Plasma Glucose: 120 (calc)
eAG (mmol/L): 6.6 (calc)

## 2019-02-19 LAB — LIPID PANEL W/REFLEX DIRECT LDL
Cholesterol: 190 mg/dL (ref <200)
HDL: 55 mg/dL (ref >=50)
LDL Cholesterol (Calc): 111 mg/dL (calc) — ABNORMAL HIGH
Non-HDL Cholesterol (Calc): 135 mg/dL (calc) — ABNORMAL HIGH (ref <130)
Total CHOL/HDL Ratio: 3.5 (calc) (ref <5.0)
Triglycerides: 127 mg/dL (ref <150)

## 2019-02-19 LAB — TSH: TSH: 2.28 mIU/L (ref 0.40–4.50)

## 2019-02-20 NOTE — Progress Notes (Signed)
How are you?   LDL up from 1 year ago but still looks good.  Thyroid looks good.  A1c is pre-diabetes range. Certainly encourage you to continue to decrease sugars and carbs and regular exercise but not diabetic at this time.

## 2019-02-22 ENCOUNTER — Other Ambulatory Visit: Payer: Self-pay | Admitting: Physician Assistant

## 2019-02-28 ENCOUNTER — Encounter: Payer: Self-pay | Admitting: Physician Assistant

## 2019-02-28 ENCOUNTER — Other Ambulatory Visit: Payer: Self-pay

## 2019-02-28 DIAGNOSIS — G4733 Obstructive sleep apnea (adult) (pediatric): Secondary | ICD-10-CM

## 2019-02-28 MED ORDER — AMBULATORY NON FORMULARY MEDICATION: 0 refills | Status: AC

## 2019-02-28 NOTE — Progress Notes (Signed)
CPAP order printed. Sending to [facility] per Pt request

## 2019-03-12 ENCOUNTER — Encounter: Payer: Self-pay | Admitting: Physician Assistant

## 2019-03-26 NOTE — Telephone Encounter (Signed)
Sending note to Janett Billow with [facility] for update on what needs to happen with this.Marland Kitchen

## 2019-04-02 ENCOUNTER — Other Ambulatory Visit: Payer: Self-pay

## 2019-04-02 ENCOUNTER — Ambulatory Visit (INDEPENDENT_AMBULATORY_CARE_PROVIDER_SITE_OTHER)
Admission: RE | Admit: 2019-04-02 | Discharge: 2019-04-02 | Disposition: A | Discharge status: Home / Self Care | Payer: Medicare Other | Source: Ambulatory Visit

## 2019-04-02 DIAGNOSIS — H00015 Hordeolum externum left lower eyelid: Secondary | ICD-10-CM | POA: Diagnosis not present

## 2019-04-02 NOTE — ED Provider Notes (Signed)
Virtual Visit via Video Note:  Misty [name]  initiated request for Telemedicine visit with [facility] team. I connected with Misty [name]  on 04/02/2019 at 10:24 AM  for a synchronized telemedicine visit using a video enabled HIPPA compliant telemedicine application. I verified that I am speaking with Misty [name]  using two identifiers. Misty Hall-Potvin, PA-C  was physically located in a [facility] site and Misty [name] was located at a different location.   The limitations of evaluation and management by telemedicine as well as the availability of in-person appointments were discussed. Patient was informed that she  may incur a bill ( including co-pay) for this virtual visit encounter. Misty [name]  expressed understanding and gave verbal consent to proceed with virtual visit.     History of Present Illness:Misty [name]  is a 63 y.o. female presents with her husband, who provides translation, for left lower eyelid pain, swelling, erythema since his morning.  Patient endorsing history of styes, feels this is similar.  Denies change in vision, orbital pain, photophobia, headache.  Patient has resolved these in the past with hot compresses.  States that this provides some relief.  Patient is largely concerned as it "popped up so quick ".  Patient denies autoimmune/immunocompromise status, history of HIV, diabetes.  Patient has never undergone I&D before, cannot recall last antibiotic use for similar complaint.  Review of Systems  Constitutional: Negative for fever and malaise/fatigue.  Eyes: Negative for blurred vision, double vision, photophobia, pain, discharge and redness.       Positive for left lower lid stye, lateral aspect. Negative for open wound, foreign body, active discharge  Respiratory: Negative for cough and shortness of breath.   Cardiovascular: Negative for chest pain and palpitations.  Gastrointestinal: Negative for abdominal pain, diarrhea and vomiting.   Musculoskeletal: Negative for joint pain and myalgias.       Past Medical History:  Diagnosis Date  . Arthritis   . Hyperlipidemia   . Osteopenia determined by x-ray 12/07/2016   DEXA scan July 2018    No Known Allergies      Observations/Objective: 63 y.o. female Sitting in no acute distress.  Patient is able to speak in full sentences without coughing, sneezing, wheezing.  Left lower lid, lateral aspect with mild edema, erythema.  Patient reporting warmth and tenderness to palpation.  Patient denied open wound, active discharge, periorbital edema/pain.  Assessment and Plan: 1.  Left lower lid hordeolum Patient afebrile, nontoxic.  Low concern for preseptal cellulitis at this time.  We will treat conservatively with hot compresses outlined in patient instructions.  Patient and has been verbalized understanding, are agreeable to plan.    Follow Up Instructions: Patient to seek in-person evaluation for persistent, worsening symptoms as outlined in patient instructions.   I discussed the assessment and treatment plan with the patient. The patient was provided an opportunity to ask questions and all were answered. The patient agreed with the plan and demonstrated an understanding of the instructions.   The patient was advised to call back or seek an in-person evaluation if the symptoms worsen or if the condition fails to improve as anticipated.  I provided 15 minutes of non-face-to-face time during this encounter.    [place], PA-C  04/02/2019 10:24 AM        [name], [place], PA-C 04/02/19 1024

## 2019-04-02 NOTE — Discharge Instructions (Addendum)
Apply hot compresses every 2-3 hours if needed. Avoid face/eye make-up. Keep area clean and dry. You will need to be seen in person for worsening pain, swelling, change in vision, fever.

## 2019-04-02 NOTE — Telephone Encounter (Signed)
[place] for virtual to discuss. Just let patient know needs dedicated visit once a year to discuss exact usage and benefits.

## 2019-04-02 NOTE — Telephone Encounter (Signed)
Misty [name],  I have been contacting [facility] Muff in [place] and it appears patient needs an appointment to discuss usage within the last year. It appears September 2019 was the last appt this was discussed at.   Janett Billow from [facility] stated: "We did get your fax on 03/19/19, however none of the recent Dr's notes discuss current CPAP usage. We need this documentation in order to be able to provide the pt with supplies and to bill Medicare. Our office manager, Jeneen Rinks, was supposed to be getting in touch regarding this issue as well"  FYI to PCP. OK for scheduler to go ahead and contact pt for virtual visit to discuss?

## 2019-04-03 NOTE — Telephone Encounter (Signed)
Called and left pt msg advising that per insurance requirements we need visit discussing CPAP to be able to fax to [place].   Call back info provided to schedule appt

## 2019-04-07 ENCOUNTER — Other Ambulatory Visit: Payer: Self-pay | Admitting: Family Medicine

## 2019-04-10 ENCOUNTER — Telehealth (INDEPENDENT_AMBULATORY_CARE_PROVIDER_SITE_OTHER): Payer: Medicare Other | Admitting: Physician Assistant

## 2019-04-10 VITALS — Temp 97.0°F | Ht 66.0 in | Wt 149.0 lb

## 2019-04-10 DIAGNOSIS — G4733 Obstructive sleep apnea (adult) (pediatric): Secondary | ICD-10-CM | POA: Diagnosis not present

## 2019-04-10 NOTE — Progress Notes (Signed)
Used [facility] for first CPAP machine They think this isn't set up right, no one ever helped with set up/instructions Patient's husband now on CPAP and went through [facility] They would like her transferred to this company Having issues with machine - mask comes off multiple times in the night

## 2019-04-10 NOTE — Progress Notes (Signed)
Records sent to [facility] at [phone] with confirmation received.

## 2019-04-10 NOTE — Progress Notes (Signed)
Patient ID: Misty [name], female   DOB: 26-Jul-1955, 63 y.o.   MRN: [id number] .Marland KitchenVirtual Visit via Video Note  I connected with Misty [name] on 04/10/19 at  8:30 AM EST by a video enabled telemedicine application and verified that I am speaking with the correct person using two identifiers.  Location: Patient: home Provider: clinic   I discussed the limitations of evaluation and management by telemedicine and the availability of in person appointments. The patient expressed understanding and agreed to proceed.  History of Present Illness: Patient is a 63 year old female with OSA and on CPAP for the last 2 years who calls into the clinic with some concerns.  Her husband accompanies her due to her deafness. She has been using [facility] for her CPAP needs.  She has noticed continual leaking with the settings set at 20 mm of pressure.  Huey Romans has never come out to check on this and there has never been any contact with her.  Her husband recently got a CPAP machine with [facility].  They have worked with him to get the settings right and the most benefit out of his machine.  She would like to switch to this company.  She is using her current machine nightly.  She confirms that she uses it most hours of the night.  She has had great benefit in energy and overall health since she started using CPAP machine.  .. Active Ambulatory Problems    Diagnosis Date Noted  . 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 Essential hypertension, benign 12/16/2013  . Hyperlipidemia 12/16/2013  . Frequent headaches 12/16/2013  . Hx of extrinsic asthma 12/16/2013  . Pre-diabetes 12/27/2013  . Polyp, cervix 09/09/2015  . Midline low back pain without sciatica 09/09/2015  . Trigger finger, right 05/17/2016  . Gluteal tendonitis 07/25/2016  . Screening for breast cancer 07/25/2016  . Fatigue 12/05/2016  . Osteopenia determined by x-ray 12/07/2016  . OSA (obstructive sleep apnea) 01/02/2017  . Dyspnea 04/13/2017  . GERD  (gastroesophageal reflux disease) 04/13/2017  . Vaginal dryness, menopausal 01/31/2018  . Female cystocele 02/12/2019  . Constipation 02/12/2019   Resolved Ambulatory Problems    Diagnosis Date Noted  . Finger pain, right 05/17/2016   Past Medical History:  Diagnosis Date  . Arthritis    Reviewed med, allergy, problem list.     Observations/Objective: NO acute distress. Normal mood and appearance.   .. Today's Vitals   04/10/19 0818  Temp: (!) 97 F (36.1 C)  TempSrc: Oral  Weight: 149 lb (67.6 kg)  Height: 5\' 6"  (1.676 m)   Body mass index is 24.05 kg/m.   Assessment and Plan: Marland KitchenMarland KitchenMaricza was seen today for sleep apnea.  Diagnoses and all orders for this visit:  OSA (obstructive sleep apnea)   Will send notes along with last sleep study to [facility] to have her set up with new company and address her problems and concerns with CPAP.   Follow Up Instructions:    I discussed the assessment and treatment plan with the patient. The patient was provided an opportunity to ask questions and all were answered. The patient agreed with the plan and demonstrated an understanding of the instructions.   The patient was advised to call back or seek an in-person evaluation if the symptoms worsen or if the condition fails to improve as anticipated.   Iran Planas, PA-C

## 2019-10-01 ENCOUNTER — Other Ambulatory Visit: Payer: Self-pay

## 2019-10-01 ENCOUNTER — Encounter: Payer: Self-pay | Admitting: Nurse Practitioner

## 2019-10-01 ENCOUNTER — Ambulatory Visit (INDEPENDENT_AMBULATORY_CARE_PROVIDER_SITE_OTHER): Payer: Medicare Other | Admitting: Nurse Practitioner

## 2019-10-01 VITALS — BP 144/67 | HR 77 | Ht 66.0 in | Wt 162.0 lb

## 2019-10-01 DIAGNOSIS — N3090 Cystitis, unspecified without hematuria: Secondary | ICD-10-CM | POA: Diagnosis not present

## 2019-10-01 DIAGNOSIS — R319 Hematuria, unspecified: Secondary | ICD-10-CM | POA: Diagnosis not present

## 2019-10-01 DIAGNOSIS — R32 Unspecified urinary incontinence: Secondary | ICD-10-CM | POA: Diagnosis not present

## 2019-10-01 LAB — POCT URINALYSIS DIP (CLINITEK)
Bilirubin, UA: NEGATIVE
Blood, UA: NEGATIVE
Glucose, UA: NEGATIVE mg/dL
Ketones, POC UA: NEGATIVE mg/dL
Nitrite, UA: NEGATIVE
POC PROTEIN,UA: NEGATIVE
Spec Grav, UA: 1.02 (ref 1.010–1.025)
Urobilinogen, UA: 0.2 E.U./dL
pH, UA: 7 (ref 5.0–8.0)

## 2019-10-01 MED ORDER — NITROFURANTOIN MONOHYD MACRO 100 MG PO CAPS: 100.0000 mg | ORAL_CAPSULE | Freq: Two times a day (BID) | ORAL | 0 refills | Status: DC

## 2019-10-01 MED ORDER — PHENAZOPYRIDINE HCL 200 MG PO TABS: 200.0000 mg | ORAL_TABLET | Freq: Three times a day (TID) | ORAL | 0 refills | Status: AC

## 2019-10-01 NOTE — Progress Notes (Signed)
Acute Office Visit  Subjective:    Patient ID: Misty [name], female    DOB: [birth year], 64 y.o.   MRN: [id number]  Chief Complaint  Patient presents with  . Hematuria  . Abdominal Pain    HPI  Misty [name] is a pleasant 64 year old female presenting today with an ASL interpreter for evaluation of symptoms of lower abdominal pain (more on the left side) with intermittent hematuria, urinary leakage, and bladder spasms. She also reports mild lower back pain intermittently. She reports her symptoms have been ongoing for the past week.   She is also having ongoing issues with her "bladder dropping" and reports that this is worse with increased activity and movement. She reports her symptoms have worsened over the last few months and she would like to see if something can be done to help with this as it causes intermittent bleeding and is very uncomfortable.   She denies fever, chills, nausea, vomiting, diarrhea, or severe pain or colicky pain.    Past Medical History:  Diagnosis Date  . Arthritis   . Hyperlipidemia   . Osteopenia determined by x-ray 12/07/2016   DEXA scan July 2018    No past surgical history on file.  Family History  Problem Relation Age of Onset  . Hyperlipidemia Mother   . Cancer Father        prostate  . Diabetes Father   . Cancer Maternal Grandfather        colon cancer  . Stroke Maternal Grandfather     Social History   Socioeconomic History  . Marital status: Married    Spouse name: Not on file  . Number of children: Not on file  . Years of education: Not on file  . Highest education level: Not on file  Occupational History  . Not on file  Tobacco Use  . Smoking status: Never Smoker  . Smokeless tobacco: Never Used  Substance and Sexual Activity  . Alcohol use: No  . Drug use: No  . Sexual activity: Not on file  Other Topics Concern  . Not on file  Social History Narrative  . Not on file   Social Determinants of Health   Financial Resource  Strain:   . Difficulty of Paying Living Expenses:   Food Insecurity:   . Worried About Charity fundraiser in the Last Year:   . Arboriculturist in the Last Year:   Transportation Needs:   . Film/video editor (Medical):   Marland Kitchen Lack of Transportation (Non-Medical):   Physical Activity:   . Days of Exercise per Week:   . Minutes of Exercise per Session:   Stress:   . Feeling of Stress :   Social Connections:   . Frequency of Communication with Friends and Family:   . Frequency of Social Gatherings with Friends and Family:   . Attends Religious Services:   . Active Member of Clubs or Organizations:   . Attends Archivist Meetings:   Marland Kitchen Marital Status:   Intimate Partner Violence:   . Fear of Current or Ex-Partner:   . Emotionally Abused:   Marland Kitchen Physically Abused:   . Sexually Abused:     Outpatient Medications Prior to Visit  Medication Sig Dispense Refill  . AMBULATORY NON FORMULARY MEDICATION Continuous positive airway pressure (CPAP) machine auto-titrate from 4-20 cm of H2O pressure, with all supplemental supplies as needed. 1 each 0  . Calcium Carbonate-Vitamin D (CALCIUM + D PO) Take 600 mg  by mouth 2 (two) times daily.    . fluticasone (FLONASE) 50 MCG/ACT nasal spray SPRAY 2 SPRAYS INTO EACH NOSTRIL EVERY DAY 16 mL 12  . magnesium oxide (MAG-OX) 400 MG tablet Take 400 mg by mouth daily.    . meclizine (ANTIVERT) 25 MG tablet Take 1 tablet (25 mg total) by mouth 3 (three) times daily as needed for dizziness. 30 tablet 0  . Multiple Vitamin (MULTIVITAMIN) tablet Take 1 tablet by mouth daily.    . Omega-3 Fatty Acids (FISH OIL) 1200 MG CAPS Take by mouth daily.    Marland Kitchen PROAIR HFA 108 (90 Base) MCG/ACT inhaler TAKE 2 PUFFS BY MOUTH EVERY 6 HOURS AS NEEDED FOR WHEEZE OR SHORTNESS OF BREATH 8.5 Inhaler 2  . simvastatin (ZOCOR) 40 MG tablet Take 1 tablet (40 mg total) by mouth daily at 6 PM. 90 tablet 3  . VITAMIN E PO Take by mouth daily.     No facility-administered  medications prior to visit.    No Known Allergies  Review of Systems  Constitutional: Negative for activity change, appetite change, chills, fatigue and fever.  Respiratory: Negative for shortness of breath.   Cardiovascular: Negative for chest pain and palpitations.  Gastrointestinal: Positive for abdominal pain. Negative for abdominal distention, blood in stool, constipation, diarrhea, nausea, rectal pain and vomiting.  Endocrine: Positive for polyuria.  Genitourinary: Positive for dysuria, flank pain, frequency, hematuria and urgency. Negative for decreased urine volume, genital sores, pelvic pain, vaginal bleeding, vaginal discharge and vaginal pain.  Musculoskeletal: Positive for back pain. Negative for myalgias.  Neurological: Negative for dizziness, weakness and light-headedness.  Psychiatric/Behavioral: Negative for confusion and decreased concentration.       Objective:    Physical Exam Vitals and nursing note reviewed.  Constitutional:      Appearance: She is well-developed and normal weight.  HENT:     Head: Normocephalic.  Eyes:     Extraocular Movements: Extraocular movements intact.     Pupils: Pupils are equal, round, and reactive to light.  Cardiovascular:     Rate and Rhythm: Normal rate and regular rhythm.     Heart sounds: Normal heart sounds.  Pulmonary:     Effort: Pulmonary effort is normal.     Breath sounds: Normal breath sounds.  Abdominal:     General: Abdomen is flat. Bowel sounds are normal. There is no distension or abdominal bruit.     Palpations: Abdomen is soft. There is no shifting dullness, hepatomegaly, splenomegaly, mass or pulsatile mass.     Tenderness: There is abdominal tenderness in the suprapubic area. There is no right CVA tenderness or left CVA tenderness.  Skin:    General: Skin is warm and dry.     Capillary Refill: Capillary refill takes less than 2 seconds.  Neurological:     General: No focal deficit present.     Mental  Status: She is alert and oriented to person, place, and time.  Psychiatric:        Mood and Affect: Mood normal.        Behavior: Behavior normal.     BP (!) 144/67   Pulse 77   Ht 5\' 6"  (1.676 m)   Wt 162 lb (73.5 kg)   SpO2 97%   BMI 26.15 kg/m  Wt Readings from Last 3 Encounters:  10/01/19 162 lb (73.5 kg)  04/10/19 149 lb (67.6 kg)  02/11/19 151 lb (68.5 kg)    There are no preventive care reminders to display for  this patient.  There are no preventive care reminders to display for this patient.   Lab Results  Component Value Date   TSH 2.28 02/18/2019   Lab Results  Component Value Date   WBC 3.4 (L) 01/31/2018   HGB 13.3 01/31/2018   HCT 40.1 01/31/2018   MCV 87.4 01/31/2018   PLT 163 01/31/2018   Lab Results  Component Value Date   NA 144 02/18/2019   K 4.1 02/18/2019   CO2 27 02/18/2019   GLUCOSE 112 (H) 02/18/2019   BUN 16 02/18/2019   CREATININE 0.63 02/18/2019   BILITOT 0.9 02/18/2019   ALKPHOS 80 12/05/2016   AST 23 02/18/2019   ALT 29 02/18/2019   PROT 7.0 02/18/2019   ALBUMIN 4.7 12/05/2016   CALCIUM 9.8 02/18/2019   Lab Results  Component Value Date   CHOL 190 02/18/2019   Lab Results  Component Value Date   HDL 55 02/18/2019   Lab Results  Component Value Date   LDLCALC 111 (H) 02/18/2019   Lab Results  Component Value Date   TRIG 127 02/18/2019   Lab Results  Component Value Date   CHOLHDL 3.5 02/18/2019   Lab Results  Component Value Date   HGBA1C 5.8 (H) 02/18/2019       Assessment & Plan:   1. Cystitis Symptoms and presentation consistent with acute cystitis. Point of care UA today reveal the presence of large leukocytes. There is no presence of blood on the UA, but the patient has visualized some bleeding in the recent week, most notably when her bladder seems to be prolapsing.  She has not had recent antibiotics.  Script for [employer] and pyridium provided to the pharmacy.  Instructions to increase water intake  and monitor for worsening symptoms, fever, and chills. Patient provided with handout on UTI prevention and management.  Follow-up if symptoms worsen or fail to improve.  - POCT URINALYSIS DIP (CLINITEK) - nitrofurantoin, macrocrystal-monohydrate, (MACROBID) 100 MG capsule; Take 1 capsule (100 mg total) by mouth 2 (two) times daily.  Dispense: 14 capsule; Refill: 0 - phenazopyridine (PYRIDIUM) 200 MG tablet; Take 1 tablet (200 mg total) by mouth 3 (three) times daily for 2 days.  Dispense: 6 tablet; Refill: 0  2. Hematuria, unspecified type Reported intermittent hematuria noted with bladder prolapse. Given the patients reported symptoms, a referral to Urology provided for evaluation of prolapse and discussion of treatment options available.  - POCT URINALYSIS DIP (CLINITEK) - nitrofurantoin, macrocrystal-monohydrate, (MACROBID) 100 MG capsule; Take 1 capsule (100 mg total) by mouth 2 (two) times daily.  Dispense: 14 capsule; Refill: 0 - phenazopyridine (PYRIDIUM) 200 MG tablet; Take 1 tablet (200 mg total) by mouth 3 (three) times daily for 2 days.  Dispense: 6 tablet; Refill: 0  3. Urinary incontinence, unspecified type Urinary incontinence in the setting of acute cystitis and patient reported bladder prolapse. Point of care UA today reveal the presence of large leukocytes. There is no presence of blood on the UA, but the patient has visualized some bleeding in the recent week. She has not had recent antibiotics.  Script for [employer] and pyridium provided to the pharmacy.  Instructions to increase water intake and monitor for worsening symptoms, fever, and chills. Patient provided with handout on UTI prevention and management.   Referral to urology provided for evaluation of prolapsed bladder and urinary incontinence.  Will happily provide supportive care in conjunction with urology recommendations.  - POCT URINALYSIS DIP (CLINITEK) - nitrofurantoin, macrocrystal-monohydrate, (MACROBID) 100 MG  capsule; Take 1 capsule (100 mg total) by mouth 2 (two) times daily.  Dispense: 14 capsule; Refill: 0 - phenazopyridine (PYRIDIUM) 200 MG tablet; Take 1 tablet (200 mg total) by mouth 3 (three) times daily for 2 days.  Dispense: 6 tablet; Refill: 0 - Ambulatory referral to Urology  Return if symptoms worsen or fail to improve.   Orma Render, NP

## 2019-10-01 NOTE — Patient Instructions (Signed)
Urinary Tract Infection, Adult A urinary tract infection (UTI) is an infection of any part of the urinary tract. The urinary tract includes:  The kidneys.  The ureters.  The bladder.  The urethra. These organs make, store, and get rid of pee (urine) in the body. What are the causes? This is caused by germs (bacteria) in your genital area. These germs grow and cause swelling (inflammation) of your urinary tract. What increases the risk? You are more likely to develop this condition if:  You have a small, thin tube (catheter) to drain pee.  You cannot control when you pee or poop (incontinence).  You are female, and: ? You use these methods to prevent pregnancy:  A medicine that kills sperm (spermicide).  A device that blocks sperm (diaphragm). ? You have low levels of a female hormone (estrogen). ? You are pregnant.  You have genes that add to your risk.  You are sexually active.  You take antibiotic medicines.  You have trouble peeing because of: ? A prostate that is bigger than normal, if you are female. ? A blockage in the part of your body that drains pee from the bladder (urethra). ? A kidney stone. ? A nerve condition that affects your bladder (neurogenic bladder). ? Not getting enough to drink. ? Not peeing often enough.  You have other conditions, such as: ? Diabetes. ? A weak disease-fighting system (immune system). ? Sickle cell disease. ? Gout. ? Injury of the spine. What are the signs or symptoms? Symptoms of this condition include:  Needing to pee right away (urgently).  Peeing often.  Peeing small amounts often.  Pain or burning when peeing.  Blood in the pee.  Pee that smells bad or not like normal.  Trouble peeing.  Pee that is cloudy.  Fluid coming from the vagina, if you are female.  Pain in the belly or lower back. Other symptoms include:  Throwing up (vomiting).  No urge to eat.  Feeling mixed up (confused).  Being tired  and grouchy (irritable).  A fever.  Watery poop (diarrhea). How is this treated? This condition may be treated with:  Antibiotic medicine.  Other medicines.  Drinking enough water. Follow these instructions at home:  Medicines  Take over-the-counter and prescription medicines only as told by your doctor.  If you were prescribed an antibiotic medicine, take it as told by your doctor. Do not stop taking it even if you start to feel better. General instructions  Make sure you: ? Pee until your bladder is empty. ? Do not hold pee for a long time. ? Empty your bladder after sex. ? Wipe from front to back after pooping if you are a female. Use each tissue one time when you wipe.  Drink enough fluid to keep your pee pale yellow.  Keep all follow-up visits as told by your doctor. This is important. Contact a doctor if:  You do not get better after 1-2 days.  Your symptoms go away and then come back. Get help right away if:  You have very bad back pain.  You have very bad pain in your lower belly.  You have a fever.  You are sick to your stomach (nauseous).  You are throwing up. Summary  A urinary tract infection (UTI) is an infection of any part of the urinary tract.  This condition is caused by germs in your genital area.  There are many risk factors for a UTI. These include having a small, thin   tube to drain pee and not being able to control when you pee or poop.  Treatment includes antibiotic medicines for germs.  Drink enough fluid to keep your pee pale yellow. This information is not intended to replace advice given to you by your health care provider. Make sure you discuss any questions you have with your health care provider. Document Revised: 05/03/2018 Document Reviewed: 11/23/2017 Elsevier Patient Education  2020 [employer]  

## 2019-10-15 ENCOUNTER — Encounter: Payer: Self-pay | Admitting: Nurse Practitioner

## 2019-12-03 DIAGNOSIS — N8111 Cystocele, midline: Secondary | ICD-10-CM | POA: Diagnosis not present

## 2019-12-03 DIAGNOSIS — R310 Gross hematuria: Secondary | ICD-10-CM | POA: Diagnosis not present

## 2019-12-03 DIAGNOSIS — R3915 Urgency of urination: Secondary | ICD-10-CM | POA: Diagnosis not present

## 2019-12-17 DIAGNOSIS — R310 Gross hematuria: Secondary | ICD-10-CM | POA: Diagnosis not present

## 2020-02-04 DIAGNOSIS — R3915 Urgency of urination: Secondary | ICD-10-CM | POA: Diagnosis not present

## 2020-02-04 DIAGNOSIS — N281 Cyst of kidney, acquired: Secondary | ICD-10-CM | POA: Diagnosis not present

## 2020-02-04 DIAGNOSIS — N200 Calculus of kidney: Secondary | ICD-10-CM | POA: Diagnosis not present

## 2020-02-04 DIAGNOSIS — R310 Gross hematuria: Secondary | ICD-10-CM | POA: Diagnosis not present

## 2020-02-04 DIAGNOSIS — N8111 Cystocele, midline: Secondary | ICD-10-CM | POA: Diagnosis not present

## 2020-02-20 ENCOUNTER — Other Ambulatory Visit: Payer: Self-pay | Admitting: Physician Assistant

## 2020-03-09 DIAGNOSIS — N8111 Cystocele, midline: Secondary | ICD-10-CM | POA: Diagnosis not present

## 2020-03-09 DIAGNOSIS — N3946 Mixed incontinence: Secondary | ICD-10-CM | POA: Diagnosis not present

## 2020-03-09 DIAGNOSIS — R3915 Urgency of urination: Secondary | ICD-10-CM | POA: Diagnosis not present

## 2020-03-16 DIAGNOSIS — N3946 Mixed incontinence: Secondary | ICD-10-CM | POA: Diagnosis not present

## 2020-03-16 DIAGNOSIS — R3915 Urgency of urination: Secondary | ICD-10-CM | POA: Diagnosis not present

## 2020-03-22 ENCOUNTER — Other Ambulatory Visit: Payer: Self-pay | Admitting: Physician Assistant

## 2020-03-23 DIAGNOSIS — Z23 Encounter for immunization: Secondary | ICD-10-CM | POA: Diagnosis not present

## 2020-04-08 ENCOUNTER — Other Ambulatory Visit: Payer: Self-pay | Admitting: Physician Assistant

## 2020-04-09 DIAGNOSIS — Z23 Encounter for immunization: Secondary | ICD-10-CM | POA: Diagnosis not present

## 2020-04-10 ENCOUNTER — Other Ambulatory Visit: Payer: Self-pay | Admitting: Physician Assistant

## 2020-04-13 DIAGNOSIS — N393 Stress incontinence (female) (male): Secondary | ICD-10-CM | POA: Diagnosis not present

## 2020-04-13 DIAGNOSIS — N819 Female genital prolapse, unspecified: Secondary | ICD-10-CM | POA: Diagnosis not present

## 2020-04-15 ENCOUNTER — Other Ambulatory Visit: Payer: Self-pay | Admitting: Urology

## 2020-04-15 DIAGNOSIS — N819 Female genital prolapse, unspecified: Secondary | ICD-10-CM | POA: Insufficient documentation

## 2020-04-15 DIAGNOSIS — N393 Stress incontinence (female) (male): Secondary | ICD-10-CM | POA: Insufficient documentation

## 2020-05-03 ENCOUNTER — Other Ambulatory Visit: Payer: Self-pay | Admitting: Physician Assistant

## 2020-05-14 ENCOUNTER — Telehealth: Payer: Self-pay | Admitting: Physician Assistant

## 2020-05-14 MED ORDER — SIMVASTATIN 40 MG PO TABS
40.0000 mg | ORAL_TABLET | Freq: Every day | ORAL | 0 refills | Status: DC
Start: 2020-05-14 — End: 2020-07-13

## 2020-05-14 NOTE — Telephone Encounter (Signed)
Pt requested a refill on Simvastatin via mychart, while requesting appt for annual physical. She stated that she is out of medication.

## 2020-05-14 NOTE — Telephone Encounter (Signed)
1 month refill sent to last til appt.

## 2020-06-05 ENCOUNTER — Other Ambulatory Visit: Payer: Self-pay | Admitting: Physician Assistant

## 2020-06-09 DIAGNOSIS — R8271 Bacteriuria: Secondary | ICD-10-CM | POA: Diagnosis not present

## 2020-06-09 DIAGNOSIS — N3946 Mixed incontinence: Secondary | ICD-10-CM | POA: Diagnosis not present

## 2020-06-09 DIAGNOSIS — N819 Female genital prolapse, unspecified: Secondary | ICD-10-CM | POA: Diagnosis not present

## 2020-06-10 ENCOUNTER — Encounter: Payer: Medicare Other | Admitting: Physician Assistant

## 2020-06-15 NOTE — Progress Notes (Signed)
DUE TO COVID-19 ONLY ONE VISITOR IS ALLOWED TO COME WITH YOU AND STAY IN THE WAITING ROOM ONLY DURING PRE OP AND PROCEDURE DAY OF SURGERY. THE 1 VISITOR  MAY VISIT WITH YOU AFTER SURGERY IN YOUR PRIVATE ROOM DURING VISITING HOURS ONLY!  YOU NEED TO HAVE A COVID 19 TEST ON__1/22/2022 ____ @_______ , THIS TEST MUST BE DONE BEFORE SURGERY,  COVID TESTING SITE [address] [place] [address], IT IS ON THE RIGHT GOING OUT [address] APPROXIMATELY  2 MINUTES PAST [employer] ON THE RIGHT. ONCE YOUR COVID TEST IS COMPLETED,  PLEASE BEGIN THE QUARANTINE INSTRUCTIONS AS OUTLINED IN YOUR HANDOUT.                Misty [name]  06/15/2020   Your procedure is scheduled on: 06/24/2020    Report to [facility] Main  Entrance   Report to admitting at    1000 AM     Call this number if you have problems the morning of surgery [phone]    Remember: Do not eat food , candy gum or mints :After Midnight. You may have clear liquids from midnight until 0900am     CLEAR LIQUID DIET   Foods Allowed                                                                       Coffee and tea, regular and decaf                              Plain Jell-O any favor except red or purple                                            Fruit ices (not with fruit pulp)                                      Iced Popsicles                                     Carbonated beverages, regular and diet                                    Cranberry, grape and apple juices Sports drinks like Gatorade Lightly seasoned clear broth or consume(fat free) Sugar, honey syrup   _____________________________________________________________________    BRUSH YOUR TEETH MORNING OF SURGERY AND RINSE YOUR MOUTH OUT, NO CHEWING GUM CANDY OR MINTS.     Take these medicines the morning of surgery with A SIP OF WATER: pepcid, nasal spray if needed, claritin, inhalers as usual and bring   DO NOT TAKE ANY DIABETIC  MEDICATIONS DAY OF YOUR SURGERY  You may not have any metal on your body including hair pins and              piercings  Do not wear jewelry, make-up, lotions, powders or perfumes, deodorant             Do not wear nail polish on your fingernails.  Do not shave  48 hours prior to surgery.              Men may shave face and neck.   Do not bring valuables to the hospital. [name].  Contacts, dentures or bridgework may not be worn into surgery.  Leave suitcase in the car. After surgery it may be brought to your room.     Patients discharged the day of surgery will not be allowed to drive home. IF YOU ARE HAVING SURGERY AND GOING HOME THE SAME DAY, YOU MUST HAVE AN ADULT TO DRIVE YOU HOME AND BE WITH YOU FOR 24 HOURS. YOU MAY GO HOME BY TAXI OR UBER OR ORTHERWISE, BUT AN ADULT MUST ACCOMPANY YOU HOME AND STAY WITH YOU FOR 24 HOURS.  Name and phone number of your driver:  Special Instructions: N/A              Please read over the following fact sheets you were given: _____________________________________________________________________  [facility] - Preparing for Surgery Before surgery, you can play an important role.  Because skin is not sterile, your skin needs to be as free of germs as possible.  You can reduce the number of germs on your skin by washing with CHG (chlorahexidine gluconate) soap before surgery.  CHG is an antiseptic cleaner which kills germs and bonds with the skin to continue killing germs even after washing. Please DO NOT use if you have an allergy to CHG or antibacterial soaps.  If your skin becomes reddened/irritated stop using the CHG and inform your nurse when you arrive at Short Stay. Do not shave (including legs and underarms) for at least 48 hours prior to the first CHG shower.  You may shave your face/neck. Please follow these instructions carefully:  1.  Shower with CHG Soap the night  before surgery and the  morning of Surgery.  2.  If you choose to wash your hair, wash your hair first as usual with your  normal  shampoo.  3.  After you shampoo, rinse your hair and body thoroughly to remove the  shampoo.                           4.  Use CHG as you would any other liquid soap.  You can apply chg directly  to the skin and wash                       Gently with a scrungie or clean washcloth.  5.  Apply the CHG Soap to your body ONLY FROM THE NECK DOWN.   Do not use on face/ open                           Wound or open sores. Avoid contact with eyes, ears mouth and genitals (private parts).  Wash face,  Genitals (private parts) with your normal soap.             6.  Wash thoroughly, paying special attention to the area where your surgery  will be performed.  7.  Thoroughly rinse your body with warm water from the neck down.  8.  DO NOT shower/wash with your normal soap after using and rinsing off  the CHG Soap.                9.  Pat yourself dry with a clean towel.            10.  Wear clean pajamas.            11.  Place clean sheets on your bed the night of your first shower and do not  sleep with pets. Day of Surgery : Do not apply any lotions/deodorants the morning of surgery.  Please wear clean clothes to the hospital/surgery center.  FAILURE TO FOLLOW THESE INSTRUCTIONS MAY RESULT IN THE CANCELLATION OF YOUR SURGERY PATIENT SIGNATURE_________________________________  NURSE SIGNATURE__________________________________  ________________________________________________________________________

## 2020-06-16 ENCOUNTER — Encounter (HOSPITAL_COMMUNITY): Payer: Self-pay

## 2020-06-16 ENCOUNTER — Encounter (HOSPITAL_COMMUNITY)
Admission: RE | Admit: 2020-06-16 | Discharge: 2020-06-16 | Disposition: A | Discharge status: Home / Self Care | Payer: Medicare Other | Source: Ambulatory Visit | Attending: Urology | Admitting: Urology

## 2020-06-16 ENCOUNTER — Other Ambulatory Visit: Payer: Self-pay

## 2020-06-16 DIAGNOSIS — Z01818 Encounter for other preprocedural examination: Secondary | ICD-10-CM | POA: Diagnosis not present

## 2020-06-16 HISTORY — DX: Unspecified asthma, uncomplicated: J45.909

## 2020-06-16 HISTORY — DX: Other complications of anesthesia, initial encounter: T88.59XA

## 2020-06-16 HISTORY — DX: Sleep apnea, unspecified: G47.30

## 2020-06-16 HISTORY — DX: Personal history of urinary calculi: Z87.442

## 2020-06-16 HISTORY — DX: Anxiety disorder, unspecified: F41.9

## 2020-06-16 HISTORY — DX: Prediabetes: R73.03

## 2020-06-16 HISTORY — DX: Headache, unspecified: R51.9

## 2020-06-16 NOTE — Progress Notes (Signed)
Completed hx and instructions with pt At time of preop surgery was not scheduled.  LVMM for Darrel Reach at [facility] regarding thisl.  Sign Language Interpreter present.  Labs not completed due to unaware when surgery would be rescheduled.  Pt aware she would be receiving phone calls regarding date and time of surgery.  PT voiced understanding.

## 2020-06-29 ENCOUNTER — Telehealth: Payer: Self-pay | Admitting: General Practice

## 2020-07-13 ENCOUNTER — Ambulatory Visit (INDEPENDENT_AMBULATORY_CARE_PROVIDER_SITE_OTHER): Payer: Medicare Other | Admitting: Physician Assistant

## 2020-07-13 ENCOUNTER — Other Ambulatory Visit: Payer: Self-pay

## 2020-07-13 ENCOUNTER — Ambulatory Visit (INDEPENDENT_AMBULATORY_CARE_PROVIDER_SITE_OTHER): Payer: Medicare Other

## 2020-07-13 VITALS — BP 138/84 | HR 84 | Ht 66.0 in | Wt 171.0 lb

## 2020-07-13 DIAGNOSIS — M19041 Primary osteoarthritis, right hand: Secondary | ICD-10-CM | POA: Diagnosis not present

## 2020-07-13 DIAGNOSIS — M8580 Other specified disorders of bone density and structure, unspecified site: Secondary | ICD-10-CM | POA: Diagnosis not present

## 2020-07-13 DIAGNOSIS — Z1329 Encounter for screening for other suspected endocrine disorder: Secondary | ICD-10-CM | POA: Diagnosis not present

## 2020-07-13 DIAGNOSIS — J4520 Mild intermittent asthma, uncomplicated: Secondary | ICD-10-CM

## 2020-07-13 DIAGNOSIS — Z1231 Encounter for screening mammogram for malignant neoplasm of breast: Secondary | ICD-10-CM

## 2020-07-13 DIAGNOSIS — Z0000 Encounter for general adult medical examination without abnormal findings: Secondary | ICD-10-CM | POA: Diagnosis not present

## 2020-07-13 DIAGNOSIS — Z23 Encounter for immunization: Secondary | ICD-10-CM

## 2020-07-13 DIAGNOSIS — E782 Mixed hyperlipidemia: Secondary | ICD-10-CM | POA: Diagnosis not present

## 2020-07-13 DIAGNOSIS — R7303 Prediabetes: Secondary | ICD-10-CM | POA: Diagnosis not present

## 2020-07-13 DIAGNOSIS — M79642 Pain in left hand: Secondary | ICD-10-CM

## 2020-07-13 DIAGNOSIS — J310 Chronic rhinitis: Secondary | ICD-10-CM | POA: Diagnosis not present

## 2020-07-13 DIAGNOSIS — M79641 Pain in right hand: Secondary | ICD-10-CM | POA: Diagnosis not present

## 2020-07-13 DIAGNOSIS — M810 Age-related osteoporosis without current pathological fracture: Secondary | ICD-10-CM

## 2020-07-13 DIAGNOSIS — Z13220 Encounter for screening for lipoid disorders: Secondary | ICD-10-CM | POA: Diagnosis not present

## 2020-07-13 DIAGNOSIS — M19042 Primary osteoarthritis, left hand: Secondary | ICD-10-CM | POA: Diagnosis not present

## 2020-07-13 MED ORDER — DICLOFENAC SODIUM 1 % EX GEL: 1.0000 "application " | Freq: Four times a day (QID) | CUTANEOUS | 2 refills | Status: DC | PRN

## 2020-07-13 MED ORDER — FLUTICASONE PROPIONATE 50 MCG/ACT NA SUSP
2.0000 | Freq: Every day | NASAL | 5 refills | Status: DC | PRN
Start: 2020-07-13 — End: 2021-09-24

## 2020-07-13 MED ORDER — ALBUTEROL SULFATE HFA 108 (90 BASE) MCG/ACT IN AERS: INHALATION_SPRAY | RESPIRATORY_TRACT | 5 refills | Status: AC

## 2020-07-13 MED ORDER — SIMVASTATIN 40 MG PO TABS
40.0000 mg | ORAL_TABLET | Freq: Every day | ORAL | 3 refills | Status: DC
Start: 2020-07-13 — End: 2021-07-02

## 2020-07-13 NOTE — Patient Instructions (Signed)
Tumeric 500mg  twice a day for joint pain.    Health Maintenance After Age 66 After age 83, you are at a higher risk for certain long-term diseases and infections as well as injuries from falls. Falls are a major cause of broken bones and head injuries in people who are older than age 47. Getting regular preventive care can help to keep you healthy and well. Preventive care includes getting regular testing and making lifestyle changes as recommended by your health care provider. Talk with your health care provider about:  Which screenings and tests you should have. A screening is a test that checks for a disease when you have no symptoms.  A diet and exercise plan that is right for you. What should I know about screenings and tests to prevent falls? Screening and testing are the best ways to find a health problem early. Early diagnosis and treatment give you the best chance of managing medical conditions that are common after age 103. Certain conditions and lifestyle choices may make you more likely to have a fall. Your health care provider may recommend:  Regular vision checks. Poor vision and conditions such as cataracts can make you more likely to have a fall. If you wear glasses, make sure to get your prescription updated if your vision changes.  Medicine review. Work with your health care provider to regularly review all of the medicines you are taking, including over-the-counter medicines. Ask your health care provider about any side effects that may make you more likely to have a fall. Tell your health care provider if any medicines that you take make you feel dizzy or sleepy.  Osteoporosis screening. Osteoporosis is a condition that causes the bones to get weaker. This can make the bones weak and cause them to break more easily.  Blood pressure screening. Blood pressure changes and medicines to control blood pressure can make you feel dizzy.  Strength and balance checks. Your health care  provider may recommend certain tests to check your strength and balance while standing, walking, or changing positions.  Foot health exam. Foot pain and numbness, as well as not wearing proper footwear, can make you more likely to have a fall.  Depression screening. You may be more likely to have a fall if you have a fear of falling, feel emotionally low, or feel unable to do activities that you used to do.  Alcohol use screening. Using too much alcohol can affect your balance and may make you more likely to have a fall. What actions can I take to lower my risk of falls? General instructions  Talk with your health care provider about your risks for falling. Tell your health care provider if: ? You fall. Be sure to tell your health care provider about all falls, even ones that seem minor. ? You feel dizzy, sleepy, or off-balance.  Take over-the-counter and prescription medicines only as told by your health care provider. These include any supplements.  Eat a healthy diet and maintain a healthy weight. A healthy diet includes low-fat dairy products, low-fat (lean) meats, and fiber from whole grains, beans, and lots of fruits and vegetables. Home safety  Remove any tripping hazards, such as rugs, cords, and clutter.  Install safety equipment such as grab bars in bathrooms and safety rails on stairs.  Keep rooms and walkways well-lit. Activity  Follow a regular exercise program to stay fit. This will help you maintain your balance. Ask your health care provider what types of exercise are  appropriate for you.  If you need a cane or walker, use it as recommended by your health care provider.  Wear supportive shoes that have nonskid soles.   Lifestyle  Do not drink alcohol if your health care provider tells you not to drink.  If you drink alcohol, limit how much you have: ? 0-1 drink a day for women. ? 0-2 drinks a day for men.  Be aware of how much alcohol is in your drink. In the  [place] one drink equals one typical bottle of beer (12 oz), one-half glass of wine (5 oz), or one shot of hard liquor (1 oz).  Do not use any products that contain nicotine or tobacco, such as cigarettes and e-cigarettes. If you need help quitting, ask your health care provider. Summary  Having a healthy lifestyle and getting preventive care can help to protect your health and wellness after age 51.  Screening and testing are the best way to find a health problem early and help you avoid having a fall. Early diagnosis and treatment give you the best chance for managing medical conditions that are more common for people who are older than age 63.  Falls are a major cause of broken bones and head injuries in people who are older than age 68. Take precautions to prevent a fall at home.  Work with your health care provider to learn what changes you can make to improve your health and wellness and to prevent falls. This information is not intended to replace advice given to you by your health care provider. Make sure you discuss any questions you have with your health care provider. Document Revised: 09/06/2018 Document Reviewed: 03/29/2017 Elsevier Patient Education  2021 [employer].

## 2020-07-13 NOTE — Progress Notes (Addendum)
Subjective:    Patient ID: Misty [name], female    DOB: March 12, 1956, 65 y.o.   MRN: [id number]  HPI  Patient is a 65 year old female deaf female with hypertension, chronic rhinitis, OSA, reactive airway disease, GERD, history of osteoarthritis of joints, prediabetes, hyperlipidemia who presents to the clinic for follow-up and medication refills. She is accompanied by sign language interpreter.   Her major concern today is her bilateral hand pain and pain in fingers.  Right seems to be worse than the left.  Seems to be worse in the mornings and late in the evenings.  She is using diclofenac cream which helps some.  She is doing lots of paraffin heat treatments which helps some.  Her hands are becoming more more stiff.   .. Active Ambulatory Problems    Diagnosis Date Noted  . 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 Essential hypertension, benign 12/16/2013  . Hyperlipidemia 12/16/2013  . Frequent headaches 12/16/2013  . Hx of extrinsic asthma 12/16/2013  . Pre-diabetes 12/27/2013  . Polyp, cervix 09/09/2015  . Midline low back pain without sciatica 09/09/2015  . Trigger finger, right 05/17/2016  . Gluteal tendonitis 07/25/2016  . Screening for breast cancer 07/25/2016  . Fatigue 12/05/2016  . Osteopenia determined by x-ray 12/07/2016  . OSA (obstructive sleep apnea) 01/02/2017  . Dyspnea 04/13/2017  . GERD (gastroesophageal reflux disease) 04/13/2017  . Vaginal dryness, menopausal 01/31/2018  . Female cystocele 02/12/2019  . Constipation 02/12/2019  . Female genital prolapse, unspecified 04/15/2020  . Stress incontinence 04/15/2020  . Primary localized osteoarthrosis of hands, bilateral 07/15/2020  . Bilateral hand pain 07/21/2020  . Chronic rhinitis 07/21/2020  . Reactive airway disease 07/21/2020   Resolved Ambulatory Problems    Diagnosis Date Noted  . Finger pain, right 05/17/2016   Past Medical History:  Diagnosis Date  . Anxiety   . Arthritis   . Asthma   .  Complication of anesthesia   . Headache   . History of kidney stones   . Sleep apnea     Review of Systems  All other systems reviewed and are negative.      Objective:   Physical Exam Vitals reviewed.  Constitutional:      Appearance: Normal appearance.  HENT:     Head: Normocephalic.     Right Ear: Tympanic membrane, ear canal and external ear normal.     Left Ear: Tympanic membrane, ear canal and external ear normal.     Nose: Congestion present.     Mouth/Throat:     Mouth: Mucous membranes are moist.  Eyes:     Extraocular Movements: Extraocular movements intact.     Conjunctiva/sclera: Conjunctivae normal.     Pupils: Pupils are equal, round, and reactive to light.  Neck:     Vascular: No carotid bruit.  Cardiovascular:     Rate and Rhythm: Normal rate and regular rhythm.     Pulses: Normal pulses.     Heart sounds: Normal heart sounds.  Pulmonary:     Effort: Pulmonary effort is normal.     Breath sounds: Normal breath sounds.  Abdominal:     General: Bowel sounds are normal. There is no distension.     Palpations: Abdomen is soft.     Tenderness: There is no abdominal tenderness. There is no right CVA tenderness, left CVA tenderness or guarding.  Musculoskeletal:        General: Normal range of motion.     Cervical back: Normal range of motion  and neck supple.     Comments: Bilateral hand swelling and MCP nodules bilaterally. Some joint tenderness to palpation.   Lymphadenopathy:     Cervical: No cervical adenopathy.  Neurological:     General: No focal deficit present.     Mental Status: She is alert and oriented to person, place, and time.  Psychiatric:        Mood and Affect: Mood normal.        Behavior: Behavior normal.       .. Depression screen [facility] 2/9 07/13/2020 02/11/2019 01/31/2018 01/10/2018 12/05/2016  Decreased Interest 1 2 1  0 0  Down, Depressed, Hopeless 1 2 1  0 0  PHQ - 2 Score 2 4 2  0 0  Altered sleeping 1 0 0 - -  Tired, decreased  energy 1 2 1  - -  Change in appetite 1 0 0 - -  Feeling bad or failure about yourself  0 0 0 - -  Trouble concentrating 1 0 0 - -  Moving slowly or fidgety/restless 0 0 0 - -  Suicidal thoughts 0 0 0 - -  PHQ-9 Score 6 6 3  - -  Difficult doing work/chores Not difficult at all - Somewhat difficult - -   ..generalized anxiety disorder     Assessment & Plan:  Marland KitchenMarland KitchenKazandra was seen today for annual exam.  Diagnoses and all orders for this visit:  Pre-diabetes -     CBC with Differential/Platelet -     COMPLETE METABOLIC PANEL WITH GFR -     Hemoglobin A1c  Screening for lipid disorders -     Lipid Panel w/reflex Direct LDL  Thyroid disorder screening -     CBC with Differential/Platelet -     TSH  Mixed hyperlipidemia -     Lipid Panel w/reflex Direct LDL -     simvastatin (ZOCOR) 40 MG tablet; Take 1 tablet (40 mg total) by mouth daily at 6 PM.  Preventative health care -     CBC with Differential/Platelet -     COMPLETE METABOLIC PANEL WITH GFR -     Hemoglobin A1c -     Lipid Panel w/reflex Direct LDL -     TSH  Encounter for screening mammogram for malignant neoplasm of breast -     MM 3D SCREEN BREAST BILATERAL  Osteopenia, unspecified location -     CBC with Differential/Platelet -     DG Bone Density  Bilateral hand pain -     diclofenac Sodium (VOLTAREN) 1 % GEL; Apply 1 application topically 4 (four) times daily as needed (pain). -     DG Hand Complete Left -     DG Hand Complete Right  Need for pneumococcal vaccination -     Pneumococcal conjugate vaccine 13-valent IM  Chronic rhinitis -     fluticasone (FLONASE) 50 MCG/ACT nasal spray; Place 2 sprays into both nostrils daily as needed for allergies.  Mild intermittent reactive airway disease without complication -     albuterol (PROAIR HFA) 108 (90 Base) MCG/ACT inhaler; TAKE 2 PUFFS BY MOUTH EVERY 6 HOURS AS NEEDED FOR WHEEZE OR SHORTNESS OF BREATH   Preventative labs ordered.  Refills sent.   DEXA/Mammogram ordered Over 65 pap not indicated.  Flu/covid/pneumonia UTD. Pt has zostavax but not shingrix. Discussed with patient and she will consider.  Needs TDaP.   Bilateral hand pain sound like OA.  Will get xrays.  No CKD in past. Ok with oral NSAIDs for pain.  Consider tumeric. Diclofenac gel is ok as well.  Consider Dr. Darene Lamer for injections if one particular joint hurts the worst.

## 2020-07-14 ENCOUNTER — Encounter: Payer: Self-pay | Admitting: Physician Assistant

## 2020-07-15 ENCOUNTER — Encounter: Payer: Self-pay | Admitting: Physician Assistant

## 2020-07-15 DIAGNOSIS — M19042 Primary osteoarthritis, left hand: Secondary | ICD-10-CM | POA: Insufficient documentation

## 2020-07-15 DIAGNOSIS — M19041 Primary osteoarthritis, right hand: Secondary | ICD-10-CM | POA: Insufficient documentation

## 2020-07-15 NOTE — Progress Notes (Signed)
Misty [name],   You have lots of arthritis in both hands that continue to progress. It is the worse at left [facility] joint. If you have a joint that particular bothers you suggest follow up with Dr. Darene Lamer to consider injection. If most of your pain/achy/stiffiness is wide spread then suggest taking oral anti-inflammatory like celebrex? Thoughts?

## 2020-07-17 NOTE — Telephone Encounter (Signed)
Documentation only.

## 2020-07-21 DIAGNOSIS — J310 Chronic rhinitis: Secondary | ICD-10-CM | POA: Insufficient documentation

## 2020-07-21 DIAGNOSIS — M79641 Pain in right hand: Secondary | ICD-10-CM | POA: Insufficient documentation

## 2020-07-21 DIAGNOSIS — J45909 Unspecified asthma, uncomplicated: Secondary | ICD-10-CM | POA: Insufficient documentation

## 2020-08-05 ENCOUNTER — Other Ambulatory Visit: Payer: Self-pay | Admitting: Physician Assistant

## 2020-08-05 ENCOUNTER — Ambulatory Visit (INDEPENDENT_AMBULATORY_CARE_PROVIDER_SITE_OTHER): Payer: Medicare Other

## 2020-08-05 DIAGNOSIS — M859 Disorder of bone density and structure, unspecified: Secondary | ICD-10-CM | POA: Diagnosis not present

## 2020-08-05 DIAGNOSIS — Z780 Asymptomatic menopausal state: Secondary | ICD-10-CM

## 2020-08-05 DIAGNOSIS — Z1231 Encounter for screening mammogram for malignant neoplasm of breast: Secondary | ICD-10-CM

## 2020-08-05 DIAGNOSIS — M85852 Other specified disorders of bone density and structure, left thigh: Secondary | ICD-10-CM | POA: Diagnosis not present

## 2020-08-05 NOTE — Patient Instructions (Addendum)
DUE TO COVID-19 ONLY ONE VISITOR IS ALLOWED TO COME WITH YOU AND STAY IN THE WAITING ROOM ONLY DURING PRE OP AND PROCEDURE DAY OF SURGERY. THE 1 VISITOR  MAY VISIT WITH YOU AFTER SURGERY IN YOUR PRIVATE ROOM DURING VISITING HOURS ONLY!  YOU NEED TO HAVE A COVID 19 TEST ON: 08/11/20 @ 9:00 AM , THIS TEST MUST BE DONE BEFORE SURGERY,  COVID TESTING SITE [place] [address], IT IS ON THE RIGHT GOING OUT [address] APPROXIMATELY  2 MINUTES PAST [employer] ON THE RIGHT. ONCE YOUR COVID TEST IS COMPLETED,  PLEASE BEGIN THE QUARANTINE INSTRUCTIONS AS OUTLINED IN YOUR HANDOUT.                Marlaine Arey    Your procedure is scheduled on: 08/14/20   Report to [facility] Main  Entrance   Report to short stay at: 5:30 AM     Call this number if you have problems the morning of surgery [phone]    Remember: Do not eat food or drink liquids :After Midnight.   BRUSH YOUR TEETH MORNING OF SURGERY AND RINSE YOUR MOUTH OUT, NO CHEWING GUM CANDY OR MINTS.    Take these medicines the morning of surgery with A SIP OF WATER: famotidine,loratadine.Use inhalers and flonase as usual.                                You may not have any metal on your body including hair pins and              piercings  Do not wear jewelry, make-up, lotions, powders or perfumes, deodorant             Do not wear nail polish on your fingernails.  Do not shave  48 hours prior to surgery.    Do not bring valuables to the hospital. [place].  Contacts, dentures or bridgework may not be worn into surgery.  Leave suitcase in the car. After surgery it may be brought to your room.     Patients discharged the day of surgery will not be allowed to drive home. IF YOU ARE HAVING SURGERY AND GOING HOME THE SAME DAY, YOU MUST HAVE AN ADULT TO DRIVE YOU HOME AND BE WITH YOU FOR 24 HOURS. YOU MAY GO HOME BY TAXI OR UBER OR ORTHERWISE, BUT AN  ADULT MUST ACCOMPANY YOU HOME AND STAY WITH YOU FOR 24 HOURS.  Name and phone number of your driver:  Special Instructions: N/A              Please read over the following fact sheets you were given: _____________________________________________________________________   PLEASE BRING CPAP MASK [place]. DEVICE WILL BE PROVIDED!         [facility] - Preparing for Surgery Before surgery, you can play an important role.  Because skin is not sterile, your skin needs to be as free of germs as possible.  You can reduce the number of germs on your skin by washing with CHG (chlorahexidine gluconate) soap before surgery.  CHG is an antiseptic cleaner which kills germs and bonds with the skin to continue killing germs even after washing. Please DO NOT use if you have an allergy to CHG or antibacterial soaps.  If  your skin becomes reddened/irritated stop using the CHG and inform your nurse when you arrive at Short Stay. Do not shave (including legs and underarms) for at least 48 hours prior to the first CHG shower.  You may shave your face/neck. Please follow these instructions carefully:  1.  Shower with CHG Soap the night before surgery and the  morning of Surgery.  2.  If you choose to wash your hair, wash your hair first as usual with your  normal  shampoo.  3.  After you shampoo, rinse your hair and body thoroughly to remove the  shampoo.                           4.  Use CHG as you would any other liquid soap.  You can apply chg directly  to the skin and wash                       Gently with a scrungie or clean washcloth.  5.  Apply the CHG Soap to your body ONLY FROM THE NECK DOWN.   Do not use on face/ open                           Wound or open sores. Avoid contact with eyes, ears mouth and genitals (private parts).                       Wash face,  Genitals (private parts) with your normal soap.             6.  Wash thoroughly, paying special attention to the area where your surgery   will be performed.  7.  Thoroughly rinse your body with warm water from the neck down.  8.  DO NOT shower/wash with your normal soap after using and rinsing off  the CHG Soap.                9.  Pat yourself dry with a clean towel.            10.  Wear clean pajamas.            11.  Place clean sheets on your bed the night of your first shower and do not  sleep with pets. Day of Surgery : Do not apply any lotions/deodorants the morning of surgery.  Please wear clean clothes to the hospital/surgery center.  FAILURE TO FOLLOW THESE INSTRUCTIONS MAY RESULT IN THE CANCELLATION OF YOUR SURGERY PATIENT SIGNATURE_________________________________  NURSE SIGNATURE__________________________________  ________________________________________________________________________

## 2020-08-06 ENCOUNTER — Encounter (HOSPITAL_COMMUNITY): Payer: Self-pay

## 2020-08-06 ENCOUNTER — Encounter (HOSPITAL_COMMUNITY)
Admission: RE | Admit: 2020-08-06 | Discharge: 2020-08-06 | Disposition: A | Discharge status: Home / Self Care | Payer: Medicare Other | Source: Ambulatory Visit | Attending: Urology | Admitting: Urology

## 2020-08-06 ENCOUNTER — Other Ambulatory Visit: Payer: Self-pay

## 2020-08-06 DIAGNOSIS — Z01812 Encounter for preprocedural laboratory examination: Secondary | ICD-10-CM | POA: Diagnosis not present

## 2020-08-06 LAB — CBC
HCT: 40.9 % (ref 36.0–46.0)
Hemoglobin: 13.6 g/dL (ref 12.0–15.0)
MCH: 29.1 pg (ref 26.0–34.0)
MCHC: 33.3 g/dL (ref 30.0–36.0)
MCV: 87.6 fL (ref 80.0–100.0)
Platelets: 187 10*3/uL (ref 150–400)
RBC: 4.67 MIL/uL (ref 3.87–5.11)
RDW: 12.3 % (ref 11.5–15.5)
WBC: 4.6 10*3/uL (ref 4.0–10.5)
nRBC: 0 % (ref 0.0–0.2)

## 2020-08-06 LAB — HEMOGLOBIN A1C
Hgb A1c MFr Bld: 6.4 % — ABNORMAL HIGH (ref 4.8–5.6)
Mean Plasma Glucose: 136.98 mg/dL

## 2020-08-06 NOTE — Progress Notes (Signed)
COVID Vaccine Completed: Yes Date COVID Vaccine completed: 04/09/20 [place] COVID vaccine manufacturer: [employer]      PCP - Luvenia Starch Breeback: PAC. LOV: 07/13/20 Cardiologist -   Chest x-ray -  EKG -  Stress Test -  ECHO -  Cardiac Cath -  Pacemaker/ICD device last checked:  Sleep Study - Yes CPAP: Yes  Fasting Blood Sugar -  Checks Blood Sugar _____ times a day  Blood Thinner Instructions: Aspirin Instructions: Last Dose:  Anesthesia review: Hx: Pre-DIA,OSA(CPAP)  Patient denies shortness of breath, fever, cough and chest pain at PAT appointment   Patient verbalized understanding of instructions that were given to them at the PAT appointment. Patient was also instructed that they will need to review over the PAT instructions again at home before surgery.

## 2020-08-07 NOTE — Progress Notes (Signed)
Osteopenic, low bone mass holding well. Continue vitamin D and calcium along with low weight bearing exercise.

## 2020-08-10 NOTE — Progress Notes (Signed)
Normal mammogram. Follow up in 1 year.

## 2020-08-11 ENCOUNTER — Other Ambulatory Visit (HOSPITAL_COMMUNITY)
Admission: RE | Admit: 2020-08-11 | Discharge: 2020-08-11 | Disposition: A | Discharge status: Home / Self Care | Payer: Medicare Other | Source: Ambulatory Visit | Attending: Urology | Admitting: Urology

## 2020-08-11 DIAGNOSIS — Z20822 Contact with and (suspected) exposure to covid-19: Secondary | ICD-10-CM | POA: Diagnosis not present

## 2020-08-11 DIAGNOSIS — Z01812 Encounter for preprocedural laboratory examination: Secondary | ICD-10-CM | POA: Insufficient documentation

## 2020-08-11 LAB — SARS CORONAVIRUS 2 (TAT 6-24 HRS): SARS Coronavirus 2: NEGATIVE

## 2020-08-13 ENCOUNTER — Encounter (HOSPITAL_COMMUNITY): Payer: Self-pay | Admitting: Urology

## 2020-08-14 ENCOUNTER — Encounter (HOSPITAL_COMMUNITY): Payer: Self-pay | Admitting: Urology

## 2020-08-14 ENCOUNTER — Observation Stay (HOSPITAL_COMMUNITY)
Admission: RE | Admit: 2020-08-14 | Discharge: 2020-08-15 | Disposition: A | Discharge status: Home / Self Care | Payer: Medicare Other | Attending: Urology | Admitting: Urology

## 2020-08-14 ENCOUNTER — Ambulatory Visit (HOSPITAL_COMMUNITY): Payer: Medicare Other | Admitting: Anesthesiology

## 2020-08-14 ENCOUNTER — Other Ambulatory Visit: Payer: Self-pay

## 2020-08-14 ENCOUNTER — Encounter (HOSPITAL_COMMUNITY)
Admission: RE | Disposition: A | Discharge status: Home / Self Care | Payer: Self-pay | Source: Home / Self Care | Attending: Urology

## 2020-08-14 DIAGNOSIS — I10 Essential (primary) hypertension: Secondary | ICD-10-CM | POA: Diagnosis not present

## 2020-08-14 DIAGNOSIS — N819 Female genital prolapse, unspecified: Secondary | ICD-10-CM | POA: Diagnosis not present

## 2020-08-14 DIAGNOSIS — N8110 Cystocele, unspecified: Secondary | ICD-10-CM | POA: Insufficient documentation

## 2020-08-14 DIAGNOSIS — J45909 Unspecified asthma, uncomplicated: Secondary | ICD-10-CM | POA: Insufficient documentation

## 2020-08-14 DIAGNOSIS — N840 Polyp of corpus uteri: Principal | ICD-10-CM | POA: Insufficient documentation

## 2020-08-14 DIAGNOSIS — N393 Stress incontinence (female) (male): Secondary | ICD-10-CM | POA: Insufficient documentation

## 2020-08-14 DIAGNOSIS — D251 Intramural leiomyoma of uterus: Secondary | ICD-10-CM | POA: Diagnosis not present

## 2020-08-14 DIAGNOSIS — Z79899 Other long term (current) drug therapy: Secondary | ICD-10-CM | POA: Insufficient documentation

## 2020-08-14 DIAGNOSIS — N814 Uterovaginal prolapse, unspecified: Secondary | ICD-10-CM

## 2020-08-14 DIAGNOSIS — R7303 Prediabetes: Secondary | ICD-10-CM | POA: Diagnosis not present

## 2020-08-14 DIAGNOSIS — D259 Leiomyoma of uterus, unspecified: Secondary | ICD-10-CM | POA: Insufficient documentation

## 2020-08-14 HISTORY — PX: ROBOTIC ASSISTED LAPAROSCOPIC SACROCOLPOPEXY: SHX5388

## 2020-08-14 HISTORY — DX: Unspecified hearing loss, unspecified ear: H91.90

## 2020-08-14 HISTORY — PX: ROBOTIC ASSISTED LAPAROSCOPIC HYSTERECTOMY AND SALPINGECTOMY: SHX6379

## 2020-08-14 HISTORY — PX: PUBOVAGINAL SLING: SHX1035

## 2020-08-14 LAB — TYPE AND SCREEN
ABO/RH(D): B POS
Antibody Screen: NEGATIVE

## 2020-08-14 LAB — HEMOGLOBIN AND HEMATOCRIT, BLOOD
HCT: 38.7 % (ref 36.0–46.0)
Hemoglobin: 13 g/dL (ref 12.0–15.0)

## 2020-08-14 LAB — ABO/RH: ABO/RH(D): B POS

## 2020-08-14 LAB — GLUCOSE, CAPILLARY: Glucose-Capillary: 176 mg/dL — ABNORMAL HIGH (ref 70–99)

## 2020-08-14 SURGERY — XI ROBOTIC ASSISTED LAPAROSCOPIC HYSTERECTOMY AND SALPINGECTOMY
Anesthesia: General | Site: Abdomen

## 2020-08-14 MED ORDER — ALBUTEROL SULFATE HFA 108 (90 BASE) MCG/ACT IN AERS
2.0000 | INHALATION_SPRAY | Freq: Four times a day (QID) | RESPIRATORY_TRACT | Status: DC | PRN
  Filled 2020-08-14: qty 6.7

## 2020-08-14 MED ORDER — CEPHALEXIN 500 MG PO CAPS: 500.0000 mg | ORAL_CAPSULE | Freq: Two times a day (BID) | ORAL | 0 refills | Status: DC

## 2020-08-14 MED ORDER — ACETAMINOPHEN 10 MG/ML IV SOLN
1000.0000 mg | Freq: Four times a day (QID) | INTRAVENOUS | Status: DC
  Administered 2020-08-14 – 2020-08-15 (×3): 1000 mg via INTRAVENOUS
  Filled 2020-08-14 (×4): qty 100

## 2020-08-14 MED ORDER — MIDAZOLAM HCL 5 MG/5ML IJ SOLN
INTRAMUSCULAR | Status: DC | PRN
  Administered 2020-08-14: 2 mg via INTRAVENOUS

## 2020-08-14 MED ORDER — SUGAMMADEX SODIUM 200 MG/2ML IV SOLN
INTRAVENOUS | Status: DC | PRN
  Administered 2020-08-14: 155 mg via INTRAVENOUS

## 2020-08-14 MED ORDER — ONDANSETRON HCL 4 MG/2ML IJ SOLN
4.0000 mg | Freq: Three times a day (TID) | INTRAMUSCULAR | Status: DC | PRN
  Filled 2020-08-14: qty 2

## 2020-08-14 MED ORDER — ONDANSETRON HCL 4 MG/2ML IJ SOLN: 4.0000 mg | Freq: Four times a day (QID) | INTRAMUSCULAR | Status: DC | PRN

## 2020-08-14 MED ORDER — MENTHOL 3 MG MT LOZG: 1.0000 | LOZENGE | OROMUCOSAL | Status: DC | PRN

## 2020-08-14 MED ORDER — TRAMADOL HCL 50 MG PO TABS
50.0000 mg | ORAL_TABLET | Freq: Four times a day (QID) | ORAL | Status: DC | PRN
  Administered 2020-08-14 – 2020-08-15 (×2): 50 mg via ORAL
  Filled 2020-08-14 (×2): qty 1

## 2020-08-14 MED ORDER — LIDOCAINE 2% (20 MG/ML) 5 ML SYRINGE
INTRAMUSCULAR | Status: DC | PRN
  Administered 2020-08-14: 100 mg via INTRAVENOUS
  Administered 2020-08-14: 1.5 mg/kg/h via INTRAVENOUS

## 2020-08-14 MED ORDER — BUPIVACAINE LIPOSOME 1.3 % IJ SUSP
20.0000 mL | Freq: Once | INTRAMUSCULAR | Status: AC
  Administered 2020-08-14: 20 mL
  Filled 2020-08-14: qty 20

## 2020-08-14 MED ORDER — ONDANSETRON HCL 4 MG/2ML IJ SOLN
INTRAMUSCULAR | Status: DC | PRN
  Administered 2020-08-14: 4 mg via INTRAVENOUS

## 2020-08-14 MED ORDER — EPHEDRINE SULFATE-NACL 50-0.9 MG/10ML-% IV SOSY
PREFILLED_SYRINGE | INTRAVENOUS | Status: DC | PRN
  Administered 2020-08-14 (×2): 10 mg via INTRAVENOUS

## 2020-08-14 MED ORDER — LACTATED RINGERS IV SOLN: INTRAVENOUS | Status: DC | PRN

## 2020-08-14 MED ORDER — PROPOFOL 10 MG/ML IV BOLUS
INTRAVENOUS | Status: DC | PRN
  Administered 2020-08-14: 180 mg via INTRAVENOUS

## 2020-08-14 MED ORDER — DEXMEDETOMIDINE (PRECEDEX) IN NS 20 MCG/5ML (4 MCG/ML) IV SYRINGE
PREFILLED_SYRINGE | INTRAVENOUS | Status: AC
  Filled 2020-08-14: qty 5

## 2020-08-14 MED ORDER — FENTANYL CITRATE (PF) 100 MCG/2ML IJ SOLN
INTRAMUSCULAR | Status: DC | PRN
  Administered 2020-08-14 (×2): 50 ug via INTRAVENOUS
  Administered 2020-08-14: 100 ug via INTRAVENOUS
  Administered 2020-08-14: 50 ug via INTRAVENOUS

## 2020-08-14 MED ORDER — ONDANSETRON HCL 4 MG/2ML IJ SOLN
INTRAMUSCULAR | Status: AC
  Filled 2020-08-14: qty 4

## 2020-08-14 MED ORDER — BELLADONNA ALKALOIDS-OPIUM 16.2-60 MG RE SUPP: 1.0000 | Freq: Four times a day (QID) | RECTAL | Status: DC | PRN

## 2020-08-14 MED ORDER — LIDOCAINE 2% (20 MG/ML) 5 ML SYRINGE
INTRAMUSCULAR | Status: AC
  Filled 2020-08-14: qty 15

## 2020-08-14 MED ORDER — HYDROMORPHONE HCL 1 MG/ML IJ SOLN
INTRAMUSCULAR | Status: AC
  Filled 2020-08-14: qty 1

## 2020-08-14 MED ORDER — EPHEDRINE 5 MG/ML INJ
INTRAVENOUS | Status: AC
  Filled 2020-08-14: qty 10

## 2020-08-14 MED ORDER — DEXAMETHASONE SODIUM PHOSPHATE 10 MG/ML IJ SOLN
INTRAMUSCULAR | Status: AC
  Filled 2020-08-14: qty 2

## 2020-08-14 MED ORDER — DEXTROSE-NACL 5-0.45 % IV SOLN: INTRAVENOUS | Status: DC

## 2020-08-14 MED ORDER — CIPROFLOXACIN IN D5W 400 MG/200ML IV SOLN
400.0000 mg | INTRAVENOUS | Status: AC
  Administered 2020-08-14: 400 mg via INTRAVENOUS
  Filled 2020-08-14: qty 200

## 2020-08-14 MED ORDER — ESTRADIOL 0.1 MG/GM VA CREA
TOPICAL_CREAM | VAGINAL | Status: AC
  Filled 2020-08-14: qty 42.5

## 2020-08-14 MED ORDER — BUPIVACAINE-EPINEPHRINE (PF) 0.25% -1:200000 IJ SOLN
INTRAMUSCULAR | Status: AC
  Filled 2020-08-14: qty 30

## 2020-08-14 MED ORDER — ROCURONIUM BROMIDE 10 MG/ML (PF) SYRINGE
PREFILLED_SYRINGE | INTRAVENOUS | Status: AC
  Filled 2020-08-14: qty 10

## 2020-08-14 MED ORDER — DIPHENHYDRAMINE HCL 50 MG/ML IJ SOLN: 12.5000 mg | Freq: Four times a day (QID) | INTRAMUSCULAR | Status: DC | PRN

## 2020-08-14 MED ORDER — DEXMEDETOMIDINE (PRECEDEX) IN NS 20 MCG/5ML (4 MCG/ML) IV SYRINGE
PREFILLED_SYRINGE | INTRAVENOUS | Status: DC | PRN
  Administered 2020-08-14: 4 ug via INTRAVENOUS
  Administered 2020-08-14: 8 ug via INTRAVENOUS

## 2020-08-14 MED ORDER — PROPOFOL 10 MG/ML IV BOLUS
INTRAVENOUS | Status: AC
  Filled 2020-08-14: qty 40

## 2020-08-14 MED ORDER — LACTATED RINGERS IR SOLN
Status: DC | PRN
  Administered 2020-08-14: 1000 mL

## 2020-08-14 MED ORDER — ONDANSETRON HCL 4 MG/2ML IJ SOLN
4.0000 mg | INTRAMUSCULAR | Status: DC | PRN
  Administered 2020-08-14 (×2): 4 mg via INTRAVENOUS
  Filled 2020-08-14: qty 2

## 2020-08-14 MED ORDER — FENTANYL CITRATE (PF) 250 MCG/5ML IJ SOLN
INTRAMUSCULAR | Status: AC
  Filled 2020-08-14: qty 5

## 2020-08-14 MED ORDER — CEFAZOLIN SODIUM-DEXTROSE 1-4 GM/50ML-% IV SOLN
1.0000 g | Freq: Three times a day (TID) | INTRAVENOUS | Status: AC
  Administered 2020-08-14 – 2020-08-15 (×2): 1 g via INTRAVENOUS
  Filled 2020-08-14 (×2): qty 50

## 2020-08-14 MED ORDER — LIDOCAINE 2% (20 MG/ML) 5 ML SYRINGE
INTRAMUSCULAR | Status: AC
  Filled 2020-08-14: qty 5

## 2020-08-14 MED ORDER — SENNOSIDES-DOCUSATE SODIUM 8.6-50 MG PO TABS
2.0000 | ORAL_TABLET | Freq: Every day | ORAL | Status: DC
  Administered 2020-08-14: 2 via ORAL
  Filled 2020-08-14: qty 2

## 2020-08-14 MED ORDER — BUPIVACAINE-EPINEPHRINE 0.5% -1:200000 IJ SOLN
INTRAMUSCULAR | Status: DC | PRN
  Administered 2020-08-14: 20 mL
  Administered 2020-08-14: 3 mL
  Administered 2020-08-14: 10 mL

## 2020-08-14 MED ORDER — HYDROMORPHONE HCL 1 MG/ML IJ SOLN
0.5000 mg | INTRAMUSCULAR | Status: DC | PRN
  Administered 2020-08-14: 1 mg via INTRAVENOUS
  Filled 2020-08-14: qty 1

## 2020-08-14 MED ORDER — HYDROMORPHONE HCL 1 MG/ML IJ SOLN
0.2500 mg | INTRAMUSCULAR | Status: DC | PRN
Start: 2020-08-14 — End: 2020-08-14
  Administered 2020-08-14: 0.5 mg via INTRAVENOUS

## 2020-08-14 MED ORDER — MIDAZOLAM HCL 2 MG/2ML IJ SOLN
INTRAMUSCULAR | Status: AC
  Filled 2020-08-14: qty 2

## 2020-08-14 MED ORDER — ESMOLOL HCL 100 MG/10ML IV SOLN
INTRAVENOUS | Status: AC
  Filled 2020-08-14: qty 10

## 2020-08-14 MED ORDER — DIPHENHYDRAMINE HCL 12.5 MG/5ML PO ELIX: 12.5000 mg | ORAL_SOLUTION | Freq: Four times a day (QID) | ORAL | Status: DC | PRN

## 2020-08-14 MED ORDER — LACTATED RINGERS IV SOLN: INTRAVENOUS | Status: DC

## 2020-08-14 MED ORDER — OXYCODONE HCL 5 MG PO TABS
ORAL_TABLET | ORAL | Status: AC
  Filled 2020-08-14: qty 1

## 2020-08-14 MED ORDER — ROCURONIUM BROMIDE 10 MG/ML (PF) SYRINGE
PREFILLED_SYRINGE | INTRAVENOUS | Status: DC | PRN
  Administered 2020-08-14: 20 mg via INTRAVENOUS
  Administered 2020-08-14: 10 mg via INTRAVENOUS
  Administered 2020-08-14: 20 mg via INTRAVENOUS
  Administered 2020-08-14: 10 mg via INTRAVENOUS
  Administered 2020-08-14: 60 mg via INTRAVENOUS

## 2020-08-14 MED ORDER — STERILE WATER FOR IRRIGATION IR SOLN
Status: DC | PRN
  Administered 2020-08-14: 1000 mL

## 2020-08-14 MED ORDER — SIMVASTATIN 40 MG PO TABS
40.0000 mg | ORAL_TABLET | Freq: Every day | ORAL | Status: DC
  Filled 2020-08-14: qty 1

## 2020-08-14 MED ORDER — ESTRADIOL 0.1 MG/GM VA CREA: 1.0000 | TOPICAL_CREAM | VAGINAL | 0 refills | Status: AC

## 2020-08-14 MED ORDER — CHLORHEXIDINE GLUCONATE 0.12 % MT SOLN
15.0000 mL | Freq: Once | OROMUCOSAL | Status: AC
  Administered 2020-08-14: 15 mL via OROMUCOSAL

## 2020-08-14 MED ORDER — ESMOLOL HCL 100 MG/10ML IV SOLN
INTRAVENOUS | Status: DC | PRN
  Administered 2020-08-14 (×3): 10 mg via INTRAVENOUS

## 2020-08-14 MED ORDER — ORAL CARE MOUTH RINSE: 15.0000 mL | Freq: Once | OROMUCOSAL | Status: AC

## 2020-08-14 MED ORDER — OXYCODONE HCL 5 MG/5ML PO SOLN: 5.0000 mg | Freq: Once | ORAL | Status: DC | PRN

## 2020-08-14 MED ORDER — TRAMADOL HCL 50 MG PO TABS: 50.0000 mg | ORAL_TABLET | Freq: Four times a day (QID) | ORAL | 0 refills | Status: DC | PRN

## 2020-08-14 MED ORDER — DEXAMETHASONE SODIUM PHOSPHATE 10 MG/ML IJ SOLN
INTRAMUSCULAR | Status: DC | PRN
  Administered 2020-08-14: 10 mg via INTRAVENOUS

## 2020-08-14 MED ORDER — CHLORHEXIDINE GLUCONATE CLOTH 2 % EX PADS: 6.0000 | MEDICATED_PAD | Freq: Every day | CUTANEOUS | Status: DC

## 2020-08-14 MED ORDER — KETOROLAC TROMETHAMINE 15 MG/ML IJ SOLN
15.0000 mg | Freq: Four times a day (QID) | INTRAMUSCULAR | Status: DC
  Administered 2020-08-14 – 2020-08-15 (×3): 15 mg via INTRAVENOUS
  Filled 2020-08-14 (×3): qty 1

## 2020-08-14 MED ORDER — FAMOTIDINE 20 MG PO TABS
40.0000 mg | ORAL_TABLET | Freq: Every day | ORAL | Status: DC
  Administered 2020-08-15: 40 mg via ORAL
  Filled 2020-08-14: qty 2

## 2020-08-14 MED ORDER — ESTRADIOL 0.1 MG/GM VA CREA: 1.0000 | TOPICAL_CREAM | Freq: Every day | VAGINAL | 0 refills | Status: DC

## 2020-08-14 MED ORDER — CEFAZOLIN SODIUM-DEXTROSE 2-4 GM/100ML-% IV SOLN
2.0000 g | INTRAVENOUS | Status: AC
  Administered 2020-08-14: 2 g via INTRAVENOUS
  Filled 2020-08-14: qty 100

## 2020-08-14 MED ORDER — OXYCODONE HCL 5 MG PO TABS
5.0000 mg | ORAL_TABLET | Freq: Once | ORAL | Status: DC | PRN
Start: 2020-08-14 — End: 2020-08-14

## 2020-08-14 SURGICAL SUPPLY — 87 items
BAG LAPAROSCOPIC 12 15 PORT 16 (BASKET) ×2 IMPLANT
BAG RETRIEVAL 12/15 (BASKET) ×3
BAG URINE DRAIN 2000ML AR STRL (UROLOGICAL SUPPLIES) IMPLANT
BLADE HEX COATED 2.75 (ELECTRODE) ×3 IMPLANT
BLADE SURG 15 STRL LF DISP TIS (BLADE) ×4 IMPLANT
BLADE SURG 15 STRL SS (BLADE) ×2
CATH FOLEY 2WAY SLVR  5CC 16FR (CATHETERS) ×1
CATH FOLEY 2WAY SLVR 5CC 16FR (CATHETERS) ×2 IMPLANT
CHLORAPREP W/TINT 26 (MISCELLANEOUS) ×3 IMPLANT
CLIP VESOLOCK LG 6/CT PURPLE (CLIP) ×3 IMPLANT
CLIP VESOLOCK MED LG 6/CT (CLIP) IMPLANT
CLIP VESOLOCK XL 6/CT (CLIP) ×3 IMPLANT
COVER MAYO STAND STRL (DRAPES) IMPLANT
COVER SURGICAL LIGHT HANDLE (MISCELLANEOUS) ×3 IMPLANT
COVER TIP SHEARS 8 DVNC (MISCELLANEOUS) ×2 IMPLANT
COVER TIP SHEARS 8MM DA VINCI (MISCELLANEOUS) ×1
COVER WAND RF STERILE (DRAPES) IMPLANT
DECANTER SPIKE VIAL GLASS SM (MISCELLANEOUS) ×3 IMPLANT
DERMABOND ADVANCED (GAUZE/BANDAGES/DRESSINGS) ×1
DERMABOND ADVANCED .7 DNX12 (GAUZE/BANDAGES/DRESSINGS) ×2 IMPLANT
DRAIN CHANNEL RND F F (WOUND CARE) IMPLANT
DRAPE ARM DVNC X/XI (DISPOSABLE) ×8 IMPLANT
DRAPE COLUMN DVNC XI (DISPOSABLE) ×2 IMPLANT
DRAPE DA VINCI XI ARM (DISPOSABLE) ×4
DRAPE DA VINCI XI COLUMN (DISPOSABLE) ×1
DRAPE INCISE IOBAN 66X45 STRL (DRAPES) ×3 IMPLANT
DRAPE SHEET LG 3/4 BI-LAMINATE (DRAPES) ×6 IMPLANT
DRAPE SURG IRRIG POUCH 19X23 (DRAPES) ×3 IMPLANT
ELECT PENCIL ROCKER SW 15FT (MISCELLANEOUS) ×3 IMPLANT
ELECT REM PT RETURN 15FT ADLT (MISCELLANEOUS) ×3 IMPLANT
GAUZE 4X4 16PLY RFD (DISPOSABLE) ×6 IMPLANT
GAUZE PACKING 1 X5 YD ST (GAUZE/BANDAGES/DRESSINGS) ×3 IMPLANT
GAUZE PACKING 2X5 YD STRL (GAUZE/BANDAGES/DRESSINGS) ×3 IMPLANT
GLOVE SRG 8 PF TXTR STRL LF DI (GLOVE) ×2 IMPLANT
GLOVE SURG ENC MOIS LTX SZ6.5 (GLOVE) ×3 IMPLANT
GLOVE SURG ENC TEXT LTX SZ7.5 (GLOVE) ×9 IMPLANT
GLOVE SURG UNDER POLY LF SZ8 (GLOVE) ×1
GOWN STRL REUS W/TWL LRG LVL3 (GOWN DISPOSABLE) ×3 IMPLANT
GOWN STRL REUS W/TWL XL LVL3 (GOWN DISPOSABLE) ×6 IMPLANT
HOLDER FOLEY CATH W/STRAP (MISCELLANEOUS) ×3 IMPLANT
IRRIG SUCT STRYKERFLOW 2 WTIP (MISCELLANEOUS) ×3
IRRIGATION SUCT STRKRFLW 2 WTP (MISCELLANEOUS) ×2 IMPLANT
KIT BASIN OR (CUSTOM PROCEDURE TRAY) ×3 IMPLANT
KIT TURNOVER KIT A (KITS) ×3 IMPLANT
MANIPULATOR UTERINE 4.5 ZUMI (MISCELLANEOUS) IMPLANT
MARKER SKIN DUAL TIP RULER LAB (MISCELLANEOUS) ×3 IMPLANT
MESH Y UPSYLON VAGINAL (Mesh General) ×3 IMPLANT
NEEDLE HYPO 22GX1.5 SAFETY (NEEDLE) IMPLANT
NS IRRIG 1000ML POUR BTL (IV SOLUTION) ×3 IMPLANT
OCCLUDER COLPOPNEUMO (BALLOONS) IMPLANT
PACK CYSTO (CUSTOM PROCEDURE TRAY) ×3 IMPLANT
PACKING VAGINAL (PACKING) IMPLANT
PAD POSITIONING PINK XL (MISCELLANEOUS) ×3 IMPLANT
PENCIL SMOKE EVACUATOR (MISCELLANEOUS) IMPLANT
PLUG CATH AND CAP STER (CATHETERS) ×3 IMPLANT
POUCH SPECIMEN RETRIEVAL 10MM (ENDOMECHANICALS) IMPLANT
RETRACTOR LONRSTAR 16.6X16.6CM (MISCELLANEOUS) ×2 IMPLANT
RETRACTOR STAY HOOK 5MM (MISCELLANEOUS) ×3 IMPLANT
RETRACTOR STER APS 16.6X16.6CM (MISCELLANEOUS) ×3
SEAL CANN UNIV 5-8 DVNC XI (MISCELLANEOUS) ×8 IMPLANT
SEAL XI 5MM-8MM UNIVERSAL (MISCELLANEOUS) ×4
SET IRRIG Y TYPE TUR BLADDER L (SET/KITS/TRAYS/PACK) IMPLANT
SET TUBE SMOKE EVAC HIGH FLOW (TUBING) ×3 IMPLANT
SHEET LAVH (DRAPES) ×3 IMPLANT
SLING LYNX SUPRAPUBIC (Sling) ×3 IMPLANT
SOLUTION ELECTROLUBE (MISCELLANEOUS) ×3 IMPLANT
SUT MNCRL AB 4-0 PS2 18 (SUTURE) ×6 IMPLANT
SUT PROLENE 2 0 CT 1 (SUTURE) ×3 IMPLANT
SUT VIC AB 0 CT1 27 (SUTURE) ×1
SUT VIC AB 0 CT1 27XBRD ANTBC (SUTURE) ×2 IMPLANT
SUT VIC AB 2-0 SH 27 (SUTURE) ×5
SUT VIC AB 2-0 SH 27XBRD (SUTURE) ×10 IMPLANT
SUT VIC AB 2-0 UR6 27 (SUTURE) ×3 IMPLANT
SUT VIC AB 3-0 SH 27 (SUTURE) ×1
SUT VIC AB 3-0 SH 27X BRD (SUTURE) ×2 IMPLANT
SUT VICRYL 0 UR6 27IN ABS (SUTURE) ×3 IMPLANT
SYR 10ML LL (SYRINGE) ×3 IMPLANT
SYR 50ML LL SCALE MARK (SYRINGE) IMPLANT
SYR BULB IRRIG 60ML STRL (SYRINGE) IMPLANT
TOWEL OR 17X26 10 PK STRL BLUE (TOWEL DISPOSABLE) ×3 IMPLANT
TRAY LAPAROSCOPIC (CUSTOM PROCEDURE TRAY) ×3 IMPLANT
TROCAR ENDOPATH XCEL 12X100 BL (ENDOMECHANICALS) IMPLANT
TROCAR XCEL 12X100 BLDLESS (ENDOMECHANICALS) ×3 IMPLANT
TROCAR XCEL NON-BLD 5MMX100MML (ENDOMECHANICALS) ×3 IMPLANT
TUBING CONNECTING 10 (TUBING) ×3 IMPLANT
WATER STERILE IRR 1000ML POUR (IV SOLUTION) ×3 IMPLANT
WATER STERILE IRR 500ML POUR (IV SOLUTION) ×3 IMPLANT

## 2020-08-14 NOTE — Progress Notes (Signed)
Pt not wearing CPAP machine tonight due to continuous vomiting and bad headache.

## 2020-08-14 NOTE — Progress Notes (Signed)
   08/14/20 1614  Vitals  Temp 98.3 F (36.8 C)  Temp Source Oral  BP (!) 144/75  MAP (mmHg) 92  BP Location Right Arm  BP Method Automatic  Patient Position (if appropriate) Lying  Pulse Rate 88  Resp 16  MEWS COLOR  MEWS Score Color Green  Oxygen Therapy  SpO2 96 %  O2 Device Nasal Cannula  Pain Assessment  Pain Scale 0-10  Pain Score 4  Pain Type Surgical pain  Pain Location Abdomen  Pain Orientation Mid;Lower  Arrived to 1503.Transferred from stretcher to bed with assistance. Patient alert and oriented x 4. Interpreter at bedside. Oriented to the room, floor and call bell. Patient deaf. Incisions to abdominal area and upper vaginal area dry, clean and intact. No skins issues noted otherwise.  Moving all extremities. Family at bedside. Telemetry and pulse oximeter in place. RR even and unlabored. Foley catheter in place with leg strap attached to left leg. Will continue to monitor.

## 2020-08-14 NOTE — Transfer of Care (Signed)
Immediate Anesthesia Transfer of Care Note  Patient: Misty [name]  Procedure(s) Performed: Procedure(s): XI ROBOTIC SUPRACERVOCAL HYSTERECTOMY AND BILATERAL SALPINGO OOPHORECTOMY (Bilateral) XI ROBOTIC  SACROCOLPOPEXY WITH CYSTOSCOPY (N/A) PUBO-VAGINAL SLING (N/A)  Patient Location: PACU  Anesthesia Type:General  Level of Consciousness:  sedated, patient cooperative and responds to stimulation  Airway & Oxygen Therapy:Patient Spontanous Breathing and Patient connected to face mask oxgen  Post-op Assessment:  Report given to PACU RN and Post -op Vital signs reviewed and stable  Post vital signs:  Reviewed and stable  Last Vitals:  Vitals:   08/14/20 0615  BP: (!) 162/73  Pulse: 77  Resp: 16  Temp: 36.9 C  SpO2: 06%    Complications: No apparent anesthesia complications

## 2020-08-14 NOTE — Progress Notes (Signed)
[phone] [employer] to be arranged for interpretation needs throughout the night and for next oncoming shift.  (Provided by the interpreter)

## 2020-08-14 NOTE — Anesthesia Preprocedure Evaluation (Signed)
Anesthesia Evaluation  Patient identified by MRN, date of birth, ID band Patient awake    Reviewed: Allergy & Precautions, H&P , NPO status , Patient's Chart, lab work & pertinent test results  Airway Mallampati: II   Neck ROM: full    Dental   Pulmonary asthma , sleep apnea ,    breath sounds clear to auscultation       Cardiovascular hypertension,  Rhythm:regular Rate:Normal     Neuro/Psych  Headaches, PSYCHIATRIC DISORDERS Anxiety    GI/Hepatic GERD  ,  Endo/Other    Renal/GU      Musculoskeletal  (+) Arthritis ,   Abdominal   Peds  Hematology   Anesthesia Other Findings   Reproductive/Obstetrics                             Anesthesia Physical Anesthesia Plan  ASA: II  Anesthesia Plan: General   Post-op Pain Management:    Induction: Intravenous  PONV Risk Score and Plan: 3 and Ondansetron, Dexamethasone, Midazolam and Treatment may vary due to age or medical condition  Airway Management Planned: Oral ETT  Additional Equipment:   Intra-op Plan:   Post-operative Plan: Extubation in OR  Informed Consent: I have reviewed the patients History and Physical, chart, labs and discussed the procedure including the risks, benefits and alternatives for the proposed anesthesia with the patient or authorized representative who has indicated his/her understanding and acceptance.     Dental advisory given  Plan Discussed with: CRNA, Surgeon and Anesthesiologist  Anesthesia Plan Comments:         Anesthesia Quick Evaluation

## 2020-08-14 NOTE — Progress Notes (Signed)
Shift Summary:    One episode of emesis during this shift. Given zofran at 1734. During the episode of emesis patient complained of pain to the RLQ. Given scheduled Toradol per MAR. Family visited during the majority of the shift. Remained alert and oriented. Vitals signs to be taken Q4 hours for 24 hours per order. Next vital sign due at 2000. Foley catheter draining clear yellow urine, remains in place. Patient now currently resting in bed, with call bell within reach.  Will continue to monitor.   POC: Watch for pain, watch urinary output, incision sites.

## 2020-08-14 NOTE — Anesthesia Procedure Notes (Signed)
Procedure Name: Intubation Date/Time: 08/14/2020 8:04 AM Performed by: Lavina Hamman, CRNA Pre-anesthesia Checklist: Patient identified, Emergency Drugs available, Suction available, Patient being monitored and Timeout performed Patient Re-evaluated:Patient Re-evaluated prior to induction Oxygen Delivery Method: Circle system utilized Preoxygenation: Pre-oxygenation with 100% oxygen Induction Type: IV induction Ventilation: Mask ventilation without difficulty Laryngoscope Size: Mac and 3 Grade View: Grade II Tube type: Oral Tube size: 7.5 mm Number of attempts: 1 Airway Equipment and Method: Stylet Placement Confirmation: ETT inserted through vocal cords under direct vision,  positive ETCO2,  CO2 detector and breath sounds checked- equal and bilateral Secured at: 22 cm Tube secured with: Tape Dental Injury: Teeth and Oropharynx as per pre-operative assessment  Comments: ATOI

## 2020-08-14 NOTE — Op Note (Addendum)
Preoperative diagnosis:   Pelvic organ prolapse Stress urinary incontinence  Postoperative diagnosis:   Same  Procedure: Robotic-assisted laparoscopic supracervical hysterectomy and salpingo-ectomy Robotic-assisted laparoscopic sacrocolpopexy Cystoscopy Mid-urethral sling  Surgeon: Ardis Hughs, MD First assistant: Debbrah Alar, PA-C  Anesthesia: General  Complications: None  Intraoperative findings:   Avg sized uterus [employer] Y mesh used for the sacrocolpopexy. [employer] mesh mid-urethral sling  EBL: 60 mL  Specimens: Supracervical hysterectomy and salpingo-ectomy  Indication: Misty [name] is a 65 y.o. female patient with symptomatic pelvic organ prolapse and stress urinary incontinence.    After reviewing the management options for treatment, she elected to proceed with the above surgical procedure(s). We have discussed the potential benefits and risks of the procedure, side effects of the proposed treatment, the likelihood of the patient achieving the goals of the procedure, and any potential problems that might occur during the procedure or recuperation. Informed consent has been obtained.  Description of procedure:  The patient was taken to the operating room and general anesthesia was induced.  The patient was placed in the dorsal lithotomy position, prepped and draped in the usual sterile fashion, and preoperative antibiotics were administered. A preoperative time-out was performed.    A Foley catheter was then placed and placed to gravity drainage. I then made a periumbilical incision carrying the dissection down to the patient's fascia with electrocautery.  Once to the fascia, the fascia was incised and a small puncture hole made in the peritoneum to allow passage of a 93mm port.   The abdomen was insufflated and the remaining ports placed under digital guidance.  2 ports were placed lateral to the umbilicus on the right proximally 10  cm apart.  The most lateral port was approximately 3 cm above the anterior iliac spine.  2 additional ports were placed in the patient's right side in comparable positions to the most lateral port on the right was a 12 mm port.the robot was then docked at an angle from the leg obliquely along the side of the left leg.  We then began our surgery by cleaning up some of the pelvic adhesions to the small bowel and colon.  Once this was completed I started dissecting at the sacral promontory located 3 cm medial to the location where the ureter crosses over the iliac vessels at the pelvic brim. The posterior peritoneum was incised and the sacral prominence cleared off an area taking care to avoid the middle sacral vessels and the iliac branches.  I then created a posterior peritoneal tunnel starting at the sacral promontory and tunneling down the right pelvic sidewall down into the pelvis breaking back through the posterior peritoneum around the vesico-vaginal junction posteriorly.  I then continued the posterior dissection retracting down on the rectum and finding the avascular plane between the posterior vaginal wall and the rectum.  I carried this dissection down as far as I could to along the area of the perineal body.  Then focused my attention to the uterus and hysterectomy.  I first started by taking the right round ligament with a series of by polar cautery.  I then dissected the anterior leaf of the broad ligament slightly more proximal and then distally down across the anterior mucosa salpinx and the internal cervical os.  I then took of the uterine ovarian ligament on the right and dissected free the right salpinx. Once the anterior leaf of the broad ligament had been completely dissected on the patient's right and a  small bladder flap had been created anteriorly attention was turned to the left side where a similar dissection was carried out. I then turned my attention to the anterior plane between the  anterior vaginal wall and the bladder.  I was able to obtain access to the avascular plane and with a combination of both monopolar cautery and blunt dissection was able to clean and nice down to the bladder neck.  I then turned my attention back to the patient's uterus and skeletonized the right uterine artery and vein and then took this with a series of bipolar moves.  I then performed a similar uterus pedicle ligation on the left.  This point I was able to identify the patient's cervix and came through supracervical with monopolar cautery once the uterus was freed from all its attachments it was pushed into the left paracolic gutter and our attention was turned to placing the wire mesh.  Mesh was measured at approximately 7 cm anteriorly and  7 cm posteriorly and I cut this on the back table.  The mesh was then placed into the patient's abdomen through the assistant port and the anterior leaf was secured down onto the anterior vaginal wall with the apex at the bladder neck.  The posterior leaf was then secured down on the posterior vaginal wall.  These were sewn down with 2-0 Vicryl.  Between 6 and 8 were done on each side.  At this point I then went back to the previously dissected sacral promontory and posterior peritoneal tunnel and inserted a instrument through the tunnel and grasped the end of the mesh at the vaginal cuff and pull it up to the sacrum.  I then checked to ensure that the sacral mesh was not too tight by performing a vaginal exam.  I then secured the sacral leg of the mesh using a 0 Prolene.  I then reapproximated the posterior peritoneum with a 2-0 Vicryl in a running fashion around the sacral promontory.  The pelvic peritoneum was closed using a pursestring.  A small Endo Catch bag was then gently passed through the assistant port and the uterus was placed in the bag.  The bag was then brought out through the camera port once the trochars were removed.  We then made a slightly larger  extraction incision to remove the uterus.  The fascia was then closed with 0 Vicryl in a figure-of-eight fashion.  The skin was closed with 4-0 Monocryl's.  Dermabond was applied to the incision and exparel injected into the incisions.  Next, the horseshoe [place] retractor was then applied to the drape and the Foley catheter was attached to it. Using the skin hooks for skin nodes were placed in the corners of the labia minora to open up the vaginal vestibule. 5 cc of quarter percent Marcaine with 1% epinephrine was then injected into the periurethral tissue. The suprapubic incisions were then marked out 1 cm lateral to midline and 1 cm above the pubic bone.  Using a 15 blade a stab incision was made in both sides of midline. Gauze is placed over these areas to control the skin bleeding. A 1.5 cm incision was then made in the mid urethra through the vaginal mucosa. Using this Lee scissors dissection was then carried out.  Her urethra was dissected laterally on both sides to the endopelvic fascia. The dissection was completed once there was enough space to place a finger through the incision on both sides of the urethra. Using the [facility]  [employer] needle set both needles were passed through the stab incisions suprapubically down posterior to the pubic bone and then through the periurethral incisions using the index finger to guide the needle out of the incision.  Once both needles had been placed the Foley catheter was removed and cystoscopy was performed. A 70 lens was passed gently through the patient's urethra and into the bladder under visual guidance. A 360 cystoscopic evaluation was performed. There was no mucosal abnormalities or evidence of perforation from the needles. The cystoscope was then removed and the Foley catheter replaced. The bladder was then drained again and the Foley catheter.   The ends of the sling were then attached to the needles and the needles pulled up through the  retropubic space and out the suprapubic incision. Once the sling was noted to be centered the blue The apex of the sling was cut and the plastic sheath was then pulled out. The mesh was noted to be well seated around the urethra. A right angle was used to ensure that the proper amount of tension for the sling around the urethra applied. The sling was noted to be tension free and the periurethral area and well positioned.  The incision was then closed with 2-0 Vicryl in a running fashion. The stab incisions in the suprapubic area were closed with Dermabond. The vagina was then packed with Estrace impregnated vaginal packing. The patient was subsequently extubated and returned to the PACU in stable condition.  Disposition: Vaginal packing and the foley catheter will be removed tomorrow AM.  She will be given 3 days of antibiotics as well as pain medications. Followup has been scheduled for 2 weeks.

## 2020-08-14 NOTE — Discharge Instructions (Signed)
1.  Activity:  You are encouraged to ambulate frequently (about every hour during waking hours) to help prevent blood clots from forming in your legs or lungs.  However, you should not engage in any heavy lifting (> 10-15 lbs), strenuous activity, or straining. 2. Diet: You should advance your diet as instructed by your physician.  It will be normal to have some bloating, nausea, and abdominal discomfort intermittently. 3. Prescriptions:  You will be provided a prescription for pain medication to take as needed.  If your pain is not severe enough to require the prescription pain medication, you may take extra strength Tylenol instead which will have less side effects.  You should also take a prescribed stool softener to avoid straining with bowel movements as the prescription pain medication may constipate you. 4. Incisions: You may remove your dressing bandages 48 hours after surgery if not removed in the hospital.  You will either have some small staples or special tissue glue at each of the incision sites. Once the bandages are removed (if present), the incisions may stay open to air.  You may start showering (but not soaking or bathing in water) the 2nd day after surgery and the incisions simply need to be patted dry after the shower.  No additional care is needed. 5. What to call us about: You should call the office [phone]) if you develop fever > 101 or develop persistent vomiting.   You may resume aspirin, advil, aleve, vitamins, and supplements 7 days after surgery.  Use Estrace cream vaginally every other day.

## 2020-08-14 NOTE — H&P (Signed)
Pt presents today for pre-operative history and physical exam in anticipation of robotic supracervical TAH, sacrocolpopexy, midurethral sling, and cystoscopy by Dr. Louis Meckel on 06/24/20. She is doing well and is without complaint. Pt denies F/C, HA, CP, SOB, N/V, diarrhea/constipation, back pain, flank pain, and dysuria. She has intermittent hematuria which has been evaled by Dr. Tresa [name]   PT IS DEAF AND ALL COMMUNICATION WAS CARRIED OUT VIA INTERPRETER    HX:     CC: Pelvic organ prolapse  HPI: Misty [name] is a 65 year-old female established patient who is here for further evaluation of her pelvic prolapse.  Patient is recently seen Dr. Tresa [name] for hematuria evaluation. The workup was largely unremarkable. She was noted to have a significant degree of prolapse.   UDS: The patient was noted to have a a normal bladder capacity with no significant evidence of overactive bladder. She did have significant intrinsic sphincter deficiency and market stress incontinence on her evaluation with her prolapse was reduced. She was able to empty her bladder for the most part, but was noted to have a weak detrusor squeeze.   Interval: Today the patient is here for further discussion of her prolapse and subsequent repair.   She first noticed her pelvic prolapse approximately 02/28/2019. She does not have pelvic pain. Her symptoms have been worse over the last year.   She does have an abnormal sensation when she needs to urinate. She is not urinating more frequently now than usual. She does have a good size and strength to her urinary stream. She is having problems with emptying her bladder well.   She is having problems with urinary control or incontinence. She does wear protective pads. She wears 1-2 pads per day. She can get to the bathroom in time when she gets the urge to urinate. She does leak urine when she coughs, laughs, sneezes or bears down. The patient does have a history of recurrent UTIs. She has been  on antibiotics 2 times for treatment of her infections over the past year.   The patient does not posture when she voids or defecates. The patient has or has had trouble with constipation.   The patient has no history of pelvic floor surgery - she maintains her uterus.     ALLERGIES: None    MEDICATIONS: Simvastatin 40 mg tablet  Acid Controller  Allerclear  Calcium  Fish Oil  Flonase Allergy Relief  Magnesium  Multiple Vitamin  Proair Hfa 90 mcg hfa aerosol with adapter  Senokot  Vita-C  Vitamin B Complex  Vitamin E     GU PSH: Complex cystometrogram, w/ void pressure and urethral pressure profile studies, any technique - 03/16/2020 Complex Uroflow - 03/16/2020 Cystoscopy - 02/04/2020 Emg surf Electrd - 03/16/2020 Inject For cystogram - 03/16/2020 Intrabd voidng Press - 03/16/2020 Locm 300-399Mg /Ml Iodine,1Ml - 12/17/2019       PSH Notes: "Basal joint surgery for arthritis" in 2013.  Hallux surgery for arthritis in 2003, 2008.  tonsillecomy   NON-GU PSH: None   GU PMH: Female genital prolapse, unspecified - 04/13/2020 Stress Incontinence - 04/13/2020 Mixed incontinence - 03/16/2020, - 03/09/2020 Urinary Urgency - 03/16/2020, - 03/09/2020, - 02/04/2020, - 12/03/2019 Cystocele, midline - 03/09/2020, - 02/04/2020, - 12/03/2019 Gross hematuria - 02/04/2020, - 12/03/2019 Renal calculus - 02/04/2020 Renal cyst - 02/04/2020      PMH Notes: pre diabetes   NON-GU PMH: Arthritis Asthma Hypercholesterolemia Sleep Apnea    FAMILY HISTORY: 2 daughters - Daughter 1 son - Son Death -  Father Prostate Cancer - Father   SOCIAL HISTORY: Marital Status: Married Preferred Language: English; Ethnicity: Not Hispanic Or Latino; Race: White Current Smoking Status: Patient has never smoked.   Tobacco Use Assessment Completed: Used Tobacco in last 30 days? Has never drank.  Does not use drugs. Drinks 2 caffeinated drinks per day. Has not had a blood transfusion. Patient's occupation  is/was Retired.     Notes: ETOH none   REVIEW OF SYSTEMS:    GU Review Female:   Patient denies frequent urination, hard to postpone urination, burning /pain with urination, get up at night to urinate, leakage of urine, stream starts and stops, trouble starting your stream, have to strain to urinate, and being pregnant.  Gastrointestinal (Upper):   Patient reports indigestion/ heartburn. Patient denies nausea and vomiting.  Gastrointestinal (Lower):   Patient denies diarrhea and constipation.  Constitutional:   Patient denies fever, night sweats, weight loss, and fatigue.  Skin:   Patient denies skin rash/ lesion and itching.  Eyes:   Patient denies blurred vision and double vision.  Ears/ Nose/ Throat:   Patient denies sore throat and sinus problems.  Hematologic/Lymphatic:   Patient denies swollen glands and easy bruising.  Cardiovascular:   Patient denies leg swelling and chest pains.  Respiratory:   Patient denies cough and shortness of breath.  Endocrine:   Patient denies excessive thirst.  Musculoskeletal:   Patient denies back pain and joint pain.  Neurological:   Patient denies headaches and dizziness.  Psychologic:   Patient denies depression and anxiety.   VITAL SIGNS:      06/09/2020 02:09 PM  Weight 172 lb / 78.02 kg  Height 64 in / 162.56 cm  BP 159/83 mmHg  Heart Rate 88 /min  Temperature 98.2 F / 36.7 C  BMI 29.5 kg/m   MULTI-SYSTEM PHYSICAL EXAMINATION:    Constitutional: Well-nourished. No physical deformities. Normally developed. Good grooming. Pt is deaf  Neck: Neck symmetrical, not swollen. Normal tracheal position.  Respiratory: Normal breath sounds. No labored breathing, no use of accessory muscles.   Cardiovascular: Regular rate and rhythm. No murmur, no gallop.   Lymphatic: No enlargement of neck, axillae, groin.  Skin: No paleness, no jaundice, no cyanosis. No lesion, no ulcer, no rash.  Neurologic / Psychiatric: Oriented to time, oriented to place,  oriented to person. No depression, no anxiety, no agitation.  Gastrointestinal: No mass, no tenderness, no rigidity, non obese abdomen.  Eyes: Normal conjunctivae. Normal eyelids.  Ears, Nose, Mouth, and Throat: Left ear no scars, no lesions, no masses. Right ear no scars, no lesions, no masses. Nose no scars, no lesions, no masses. Normal hearing. Normal lips.  Musculoskeletal: Normal gait and station of head and neck.     Complexity of Data:  Records Review:   Previous Patient Records  Urine Test Review:   Urinalysis   06/09/20  Urinalysis  Urine Appearance Cloudy   Urine Color Yellow   Urine Glucose Neg mg/dL  Urine Bilirubin Neg mg/dL  Urine Ketones Neg mg/dL  Urine Specific Gravity 1.020   Urine Blood Neg ery/uL  Urine pH 7.0   Urine Protein Neg mg/dL  Urine Urobilinogen 0.2 mg/dL  Urine Nitrites Neg   Urine Leukocyte Esterase 2+ leu/uL  Urine WBC/hpf 6 - 10/hpf   Urine RBC/hpf 0 - 2/hpf   Urine Epithelial Cells 0 - 5/hpf   Urine Bacteria Few (10-25/hpf)   Urine Mucous Not Present   Urine Yeast NS (Not Seen)   Urine Trichomonas  Not Present   Urine Cystals Amorph Phosphates   Urine Casts NS (Not Seen)   Urine Sperm Not Present    PROCEDURES:          Urinalysis w/Scope - 81001 Dipstick Dipstick Cont'd Micro  Color: Yellow Bilirubin: Neg mg/dL WBC/hpf: 6 - 10/hpf  Appearance: Cloudy Ketones: Neg mg/dL RBC/hpf: 0 - 2/hpf  Specific Gravity: 1.020 Blood: Neg ery/uL Bacteria: Few (10-25/hpf)  pH: 7.0 Protein: Neg mg/dL Cystals: Amorph Phosphates  Glucose: Neg mg/dL Urobilinogen: 0.2 mg/dL Casts: NS (Not Seen)    Nitrites: Neg Trichomonas: Not Present    Leukocyte Esterase: 2+ leu/uL Mucous: Not Present      Epithelial Cells: 0 - 5/hpf      Yeast: NS (Not Seen)      Sperm: Not Present    ASSESSMENT:      ICD-10 Details  1 GU:   Female genital prolapse, unspecified - N81.9   2   Mixed incontinence - N39.46    PLAN:            Medications Stop Meds: Myrbetriq  50 mg tablet, extended release 24 hr 1 tablet PO Daily for bladder spasms Start: 02/04/2020  Stop: 02/03/2021   Discontinue: 06/09/2020  - Reason: The medication cycle was completed.  Oxybutynin Chloride 5 mg tablet 1 tablet PO Daily take in AM for bladder spasms Start: 12/03/2019  Stop: 12/02/2020   Discontinue: 06/09/2020  - Reason: The medication cycle was completed.            Orders Labs Urine Culture          Schedule Return Visit/Planned Activity: Next Available Appointment - Schedule Surgery          Document Letter(s):  Created for Patient: Clinical Summary         Notes:   There are no changes in the patients history or physical exam since last evaluation by Dr. Louis Meckel. Pt is scheduled to undergo robotic TAH, sacrocolpopexy, midurethral sling, and cystoscopy on 06/24/20.   Advised pt to discuss at her hospital pre-op appt having an interpreter available for day of surgery.

## 2020-08-14 NOTE — Plan of Care (Signed)
  Problem: Skin Integrity: Goal: Risk for impaired skin integrity will decrease Outcome: Progressing   Problem: Safety: Goal: Ability to remain free from injury will improve Outcome: Progressing   Problem: Pain Managment: Goal: General experience of comfort will improve Outcome: Progressing   Problem: Elimination: Goal: Will not experience complications related to bowel motility Outcome: Progressing Goal: Will not experience complications related to urinary retention Outcome: Progressing   Problem: Nutrition: Goal: Adequate nutrition will be maintained Outcome: Progressing

## 2020-08-15 DIAGNOSIS — N393 Stress incontinence (female) (male): Secondary | ICD-10-CM | POA: Diagnosis not present

## 2020-08-15 DIAGNOSIS — R7303 Prediabetes: Secondary | ICD-10-CM | POA: Diagnosis not present

## 2020-08-15 DIAGNOSIS — N8110 Cystocele, unspecified: Secondary | ICD-10-CM | POA: Diagnosis not present

## 2020-08-15 DIAGNOSIS — N840 Polyp of corpus uteri: Secondary | ICD-10-CM | POA: Diagnosis not present

## 2020-08-15 DIAGNOSIS — J45909 Unspecified asthma, uncomplicated: Secondary | ICD-10-CM | POA: Diagnosis not present

## 2020-08-15 DIAGNOSIS — D259 Leiomyoma of uterus, unspecified: Secondary | ICD-10-CM | POA: Diagnosis not present

## 2020-08-15 LAB — BASIC METABOLIC PANEL
Anion gap: 10 (ref 5–15)
BUN: 13 mg/dL (ref 8–23)
CO2: 25 mmol/L (ref 22–32)
Calcium: 9 mg/dL (ref 8.9–10.3)
Chloride: 102 mmol/L (ref 98–111)
Creatinine, Ser: 0.53 mg/dL (ref 0.44–1.00)
GFR, Estimated: >60 mL/min (ref >60)
Glucose, Bld: 138 mg/dL — ABNORMAL HIGH (ref 70–99)
Potassium: 3.5 mmol/L (ref 3.5–5.1)
Sodium: 137 mmol/L (ref 135–145)

## 2020-08-15 LAB — HEMOGLOBIN AND HEMATOCRIT, BLOOD
HCT: 36.6 % (ref 36.0–46.0)
Hemoglobin: 12.2 g/dL (ref 12.0–15.0)

## 2020-08-15 NOTE — Progress Notes (Signed)
After attempting to void multiple times pt was unsuccessful. Pt reports increased pressure and pain and an intense urge to void. Bladder scanner reads 530ml. Dr. Jeffie Pollock made aware. Verbal orders to place a 26F foley and d/c patient home with foley catheter.   Once catheter was in place 1532ml of clear yellow returned. Pt stated immidiate relief.   At home catheter care education was given to patient via writing. All patients questions answered. IVs removed. Pt d/ced home in stable condition.

## 2020-08-15 NOTE — Discharge Summary (Signed)
Physician Discharge Summary  Patient ID: Misty [name] MRN: [id number] DOB/AGE: September 19, 1955 65 y.o.  Admit date: 08/14/2020 Discharge date: 08/15/2020  Admission Diagnoses:  Cystocele with prolapse  Discharge Diagnoses:  Principal Problem:   Cystocele with prolapse   Past Medical History:  Diagnosis Date  . Anxiety   . Arthritis   . Asthma    mild   . Complication of anesthesia    hx of headache one time after surgery   . Deaf    Needs sign interpreter  . Headache   . History of kidney stones   . Hyperlipidemia   . Osteopenia determined by x-ray 12/07/2016   DEXA scan July 2018  . Pre-diabetes   . Sleep apnea    cpap     Surgeries: Procedure(s): XI ROBOTIC SUPRACERVOCAL HYSTERECTOMY AND BILATERAL SALPINGO OOPHORECTOMY XI ROBOTIC  SACROCOLPOPEXY WITH CYSTOSCOPY PUBO-VAGINAL SLING on 08/14/2020   Consultants (if any):   Discharged Condition: Improved  Hospital Course: Misty [name] is an 65 y.o. female who was admitted 08/14/2020 with a diagnosis of Cystocele with prolapse and went to the operating room on 08/14/2020 and underwent the above named procedures.  Her packing and foley removed on 3/19 and she was discharged when voiding.   She was given perioperative antibiotics:  Anti-infectives (From admission, onward)   Start     Dose/Rate Route Frequency Ordered Stop   08/14/20 1800  ceFAZolin (ANCEF) IVPB 1 g/50 mL premix        1 g 100 mL/hr over 30 Minutes Intravenous Every 8 hours 08/14/20 1505 08/15/20 0239   08/14/20 0545  ceFAZolin (ANCEF) IVPB 2g/100 mL premix        2 g 200 mL/hr over 30 Minutes Intravenous 30 min pre-op 08/14/20 0533 08/14/20 0745   08/14/20 0545  ciprofloxacin (CIPRO) IVPB 400 mg        400 mg 200 mL/hr over 60 Minutes Intravenous 60 min pre-op 08/14/20 0533 08/14/20 0859   08/14/20 0000  cephALEXin (KEFLEX) 500 MG capsule        500 mg Oral 2 times daily 08/14/20 1149      .  She was given sequential compression devices for DVT  prophylaxis.  She benefited maximally from the hospital stay and there were no complications.    Recent vital signs:  Vitals:   08/15/20 0125 08/15/20 0437  BP: (!) 120/58 130/75  Pulse: 76 79  Resp:  12  Temp: 98.5 F (36.9 C) 98.5 F (36.9 C)  SpO2: 96% 95%    Recent laboratory studies:  Lab Results  Component Value Date   HGB 12.2 08/15/2020   HGB 13.0 08/14/2020   HGB 13.6 08/06/2020   Lab Results  Component Value Date   WBC 4.6 08/06/2020   PLT 187 08/06/2020   No results found for: INR Lab Results  Component Value Date   NA 137 08/15/2020   K 3.5 08/15/2020   CL 102 08/15/2020   CO2 25 08/15/2020   BUN 13 08/15/2020   CREATININE 0.53 08/15/2020   GLUCOSE 138 (H) 08/15/2020    Discharge Medications:   Allergies as of 08/15/2020   No Known Allergies     Medication List    STOP taking these medications   CALCIUM + D PO   FISH OIL PO   ibuprofen 200 MG tablet Commonly known as: ADVIL   multivitamin tablet   SUPER B COMPLEX/C PO   VITAMIN E PO     TAKE these medications   acetaminophen  500 MG tablet Commonly known as: TYLENOL Take 1,000 mg by mouth every 6 (six) hours as needed for moderate pain or headache.   albuterol 108 (90 Base) MCG/ACT inhaler Commonly known as: ProAir HFA TAKE 2 PUFFS BY MOUTH EVERY 6 HOURS AS NEEDED FOR WHEEZE OR SHORTNESS OF BREATH   AMBULATORY NON FORMULARY MEDICATION Continuous positive airway pressure (CPAP) machine auto-titrate from 4-20 cm of H2O pressure, with all supplemental supplies as needed.   calcium carbonate 500 MG chewable tablet Commonly known as: TUMS - dosed in mg elemental calcium Chew 1,000-1,500 mg by mouth daily as needed for indigestion or heartburn.   cephALEXin 500 MG capsule Commonly known as: KEFLEX Take 1 capsule (500 mg total) by mouth 2 (two) times daily.   diclofenac Sodium 1 % Gel Commonly known as: VOLTAREN Apply 1 application topically 4 (four) times daily as needed  (pain).   estradiol 0.1 MG/GM vaginal cream Commonly known as: ESTRACE Place 1 Applicatorful vaginally 3 (three) times a week. Best to not use the applicator, but instead to use 1 pea-sized dolyp of cream and gently swap the inside of the vagina every other day.   famotidine 20 MG tablet Commonly known as: PEPCID Take 40 mg by mouth daily.   fluticasone 50 MCG/ACT nasal spray Commonly known as: FLONASE Place 2 sprays into both nostrils daily as needed for allergies.   GAS-X PO Take 2 tablets by mouth daily as needed (gas).   loratadine 10 MG tablet Commonly known as: CLARITIN Take 10 mg by mouth daily.   magnesium oxide 400 MG tablet Commonly known as: MAG-OX Take 400 mg by mouth daily.   PROBIOTIC PO Take 1 capsule by mouth daily.   sennosides-docusate sodium 8.6-50 MG tablet Commonly known as: SENOKOT-S Take 2 tablets by mouth every 14 (fourteen) days.   simvastatin 40 MG tablet Commonly known as: ZOCOR Take 1 tablet (40 mg total) by mouth daily at 6 PM.   traMADol 50 MG tablet Commonly known as: Ultram Take 1-2 tablets (50-100 mg total) by mouth every 6 (six) hours as needed for moderate pain or severe pain.       Diagnostic Studies: DG Bone Density  Result Date: 08/06/2020 EXAM: DUAL X-RAY ABSORPTIOMETRY (DXA) FOR BONE MINERAL DENSITY IMPRESSION: Dear Misty [name], Your patientKathy [name] completed a BMD test on 08/05/2020 using the Lunar iDXA DXA system (analysis: version 16) manufactured by [employer]. The following summarizes the results of our evaluation. CAK PATIENT: Name: Marrie, Chandra Patient ID: [id number] Birth Date: [birth year] Height: 64.0 in. Gender: Female Measured: 08/05/2020 Weight: 171.0 lbs. Indications: Caucasian, Estrogen Deficiency, History of Osteopenia, Postmenopausal Fractures: Treatments: Calcium, Multivitamin, Vitamin D ASSESSMENT: The BMD measured at Femur Neck Left is 0.768 g/cm2 with a T-score of -1.9. This patient is considered  osteopenic according to [facility]) criteria. The scan quality is good. Site Region Measured Date Measured Age WHO YA BMD Classification T-score AP Spine L1-L4 08/05/2020 65.6 Osteopenia -1.2 1.045 g/cm2 AP Spine L1-L4 12/07/2016 62.0 Osteopenia -1.1 1.064 g/cm2 DualFemur Neck Left 08/05/2020 65.6 Osteopenia -1.9 0.768 g/cm2 DualFemur Neck Left 12/07/2016 62.0 Osteopenia -1.7 0.806 g/cm2 DualFemur Total Mean 08/05/2020 65.6 Normal -0.2 0.980 g/cm2 DualFemur Total Mean 12/07/2016 62.0 Normal -0.1 1.001 g/cm2 World Health Organization [facility]) criteria for post-menopausal, Caucasian Women: Normal       T-score at or above -1 SD Osteopenia   T-score between -1 and -2.5 SD Osteoporosis T-score at or below -2.5 SD RECOMMENDATION: 1. All patients should  optimize calcium and vitamin D intake. 2. Consider FDA-approved medical therapies in postmenopausal women and men age 1 years and older, based on the following: a. A hip or vertebral(clinical or morphometric) fracture b. T-score < -2.5 at the femoral neck or spine after appropriate evaluation to exclude secondary causes c. Low bone mass (T-score between -1.0 and -2.5 at the femoral neck or spine) and a 10-year probability of a hip fracture > 3% or a 10-year probability of a major osteoporosis-related fracture > 20% based on the [place]-adapted WHO algorithm d. Clinician judgement and/or patient preferences may indicate treatment for people with 10-year fracture probabilities above or below these levels FOLLOW-UP: Patients with diagnosis of osteoporosis or at high risk for fracture should have regular bone mineral density tests. For patients eligible for Medicare, routine testing is allowed once every 2 years. The testing frequency can be increased to one year for patients who have rapidly progressing disease, those who are receiving or discontinuing medical therapy to restore bone mass, or have additional risk factors. I have reviewed this report, and agree  with the above findings [facility] Patient: [name]   Referring Physician: Donella [name] Birth Date: [birth year] [age] Patient ID: [id number] Height: 64.0 in. Weight: 171.0 lbs. Measured: 08/05/2020 3:21:11 PM (16 SP 2) Gender: Female Ethnicity: White Analyzed: 08/05/2020 3:25:36 PM (16 SP 2) FRAX* 10-year Probability of Fracture Based on femoral neck BMD: DualFemur (Left) Major Osteoporotic Fracture: 10.4% Hip Fracture:                1.5% Population:                  [place] (Caucasian) Risk Factors:                None *FRAX is a Materials engineer of the [employer] for Metabolic Bone Disease, a World Pharmacologist (WHO) [facility]. ASSESSMENT: The probability of a major osteoporotic fracture is 10.4% within the next ten years. The probability of a hip fracture is 1.5% within the next ten years. Electronically Signed   By: Lowella Grip III M.D.   On: 08/06/2020 07:57   MM 3D SCREEN BREAST BILATERAL  Result Date: 08/08/2020 CLINICAL DATA:  Screening. EXAM: DIGITAL SCREENING BILATERAL MAMMOGRAM WITH TOMOSYNTHESIS AND CAD TECHNIQUE: Bilateral screening digital craniocaudal and mediolateral oblique mammograms were obtained. Bilateral screening digital breast tomosynthesis was performed. The images were evaluated with computer-aided detection. COMPARISON:  Previous exam(s). ACR Breast Density Category b: There are scattered areas of fibroglandular density. FINDINGS: There are no findings suspicious for malignancy. The images were evaluated with computer-aided detection. IMPRESSION: No mammographic evidence of malignancy. A result letter of this screening mammogram will be mailed directly to the patient. RECOMMENDATION: Screening mammogram in one year. (Code:SM-B-01Y) BI-RADS CATEGORY  1: Negative. Electronically Signed   By: Lovey Newcomer M.D.   On: 08/08/2020 16:19    Disposition: Discharge disposition: 01-Home or Self  Care       Discharge Instructions    Discontinue IV   Complete by: As directed        Follow-up Information    Hollace Hayward, NP On 08/28/2020.   Why: at 10:00 Contact information: [address] [place] [address] [phone]                Signed: Irine Seal 08/15/2020, 9:02 AM

## 2020-08-16 NOTE — Anesthesia Postprocedure Evaluation (Signed)
Anesthesia Post Note  Patient: Misty [name]  Procedure(s) Performed: XI ROBOTIC SUPRACERVOCAL HYSTERECTOMY AND BILATERAL SALPINGO OOPHORECTOMY (Bilateral Abdomen) XI ROBOTIC  SACROCOLPOPEXY WITH CYSTOSCOPY (N/A Abdomen) PUBO-VAGINAL SLING (N/A Abdomen)     Patient location during evaluation: PACU Anesthesia Type: General Level of consciousness: awake and alert Pain management: pain level controlled Vital Signs Assessment: post-procedure vital signs reviewed and stable Respiratory status: spontaneous breathing, nonlabored ventilation, respiratory function stable and patient connected to nasal cannula oxygen Cardiovascular status: blood pressure returned to baseline and stable Postop Assessment: no apparent nausea or vomiting Anesthetic complications: no   No complications documented.  Last Vitals:  Vitals:   08/15/20 0437 08/15/20 1225  BP: 130/75 (!) 174/88  Pulse: 79 80  Resp: 12 20  Temp: 36.9 C 37.1 C  SpO2: 95% 94%    Last Pain:  Vitals:   08/15/20 1225  TempSrc: Oral  PainSc:                  [place]

## 2020-08-17 ENCOUNTER — Encounter (HOSPITAL_COMMUNITY): Payer: Self-pay | Admitting: Urology

## 2020-08-18 LAB — SURGICAL PATHOLOGY

## 2020-08-19 ENCOUNTER — Emergency Department (HOSPITAL_COMMUNITY)
Admission: EM | Admit: 2020-08-19 | Discharge: 2020-08-20 | Disposition: A | Discharge status: Home / Self Care | Payer: Medicare Other | Attending: Emergency Medicine | Admitting: Emergency Medicine

## 2020-08-19 ENCOUNTER — Encounter (HOSPITAL_COMMUNITY): Payer: Self-pay

## 2020-08-19 ENCOUNTER — Other Ambulatory Visit: Payer: Self-pay

## 2020-08-19 DIAGNOSIS — R109 Unspecified abdominal pain: Secondary | ICD-10-CM | POA: Diagnosis not present

## 2020-08-19 DIAGNOSIS — K219 Gastro-esophageal reflux disease without esophagitis: Secondary | ICD-10-CM | POA: Diagnosis not present

## 2020-08-19 DIAGNOSIS — R339 Retention of urine, unspecified: Secondary | ICD-10-CM

## 2020-08-19 DIAGNOSIS — Z7952 Long term (current) use of systemic steroids: Secondary | ICD-10-CM | POA: Diagnosis not present

## 2020-08-19 DIAGNOSIS — I10 Essential (primary) hypertension: Secondary | ICD-10-CM | POA: Diagnosis not present

## 2020-08-19 DIAGNOSIS — J45909 Unspecified asthma, uncomplicated: Secondary | ICD-10-CM | POA: Diagnosis not present

## 2020-08-19 LAB — URINALYSIS, ROUTINE W REFLEX MICROSCOPIC
Bacteria, UA: NONE SEEN
Bilirubin Urine: NEGATIVE
Glucose, UA: NEGATIVE mg/dL
Ketones, ur: NEGATIVE mg/dL
Leukocytes,Ua: NEGATIVE
Nitrite: NEGATIVE
Protein, ur: NEGATIVE mg/dL
Specific Gravity, Urine: 1.013 (ref 1.005–1.030)
pH: 6 (ref 5.0–8.0)

## 2020-08-19 LAB — CBC
HCT: 34.7 % — ABNORMAL LOW (ref 36.0–46.0)
Hemoglobin: 11.8 g/dL — ABNORMAL LOW (ref 12.0–15.0)
MCH: 29.4 pg (ref 26.0–34.0)
MCHC: 34 g/dL (ref 30.0–36.0)
MCV: 86.3 fL (ref 80.0–100.0)
Platelets: 185 10*3/uL (ref 150–400)
RBC: 4.02 MIL/uL (ref 3.87–5.11)
RDW: 12 % (ref 11.5–15.5)
WBC: 5.4 10*3/uL (ref 4.0–10.5)
nRBC: 0 % (ref 0.0–0.2)

## 2020-08-19 LAB — BASIC METABOLIC PANEL
Anion gap: 9 (ref 5–15)
BUN: 17 mg/dL (ref 8–23)
CO2: 22 mmol/L (ref 22–32)
Calcium: 9.1 mg/dL (ref 8.9–10.3)
Chloride: 109 mmol/L (ref 98–111)
Creatinine, Ser: 0.51 mg/dL (ref 0.44–1.00)
GFR, Estimated: >60 mL/min (ref >60)
Glucose, Bld: 124 mg/dL — ABNORMAL HIGH (ref 70–99)
Potassium: 3.4 mmol/L — ABNORMAL LOW (ref 3.5–5.1)
Sodium: 140 mmol/L (ref 135–145)

## 2020-08-19 NOTE — ED Triage Notes (Addendum)
Pt presents from home, states she had catheter removed at [place] today and began having pelvic pressure and pain around 3p and has not been able to urinate normally since. Pt had hysterectomy on Friday.

## 2020-08-19 NOTE — ED Notes (Signed)
Leg bag changed prior to discharge

## 2020-08-19 NOTE — ED Provider Notes (Signed)
[place] DEPT Provider Note   CSN: [id number] Arrival date & time: 08/19/20  1906     History Chief Complaint  Patient presents with  . Urinary Retention    Misty [name] is a 66 y.o. female.  HPI   Patient presented to the ED for evaluation of difficulty urinating.  Patient had a hysterectomy on Friday.  She had an indwelling Foley catheter until this morning when she went and had it removed.  Patient states she has not been able to urinate properly since then.  She has been only going small amounts but does not feel like she is emptying her bladder.  Patient's had increasing abdominal discomfort.  She was told to go back to the office if she was having any issues but they were not open after 3 PM and the patient states her symptoms really did not start vomiting until after 3 PM.    Patient Active Problem List   Diagnosis Date Noted  . Cystocele with prolapse 08/14/2020  . Bilateral hand pain 07/21/2020  . Chronic rhinitis 07/21/2020  . Reactive airway disease 07/21/2020  . Primary localized osteoarthrosis of hands, bilateral 07/15/2020  . Female genital prolapse, unspecified 04/15/2020  . Stress incontinence 04/15/2020  . Female cystocele 02/12/2019  . Constipation 02/12/2019  . Vaginal dryness, menopausal 01/31/2018  . Dyspnea 04/13/2017  . GERD (gastroesophageal reflux disease) 04/13/2017  . OSA (obstructive sleep apnea) 01/02/2017  . Osteopenia determined by x-ray 12/07/2016  . Fatigue 12/05/2016  . Gluteal tendonitis 07/25/2016  . Screening for breast cancer 07/25/2016  . Trigger finger, right 05/17/2016  . Polyp, cervix 09/09/2015  . Midline low back pain without sciatica 09/09/2015  . Pre-diabetes 12/27/2013  . Essential hypertension, benign 12/16/2013  . Hyperlipidemia 12/16/2013  . Frequent headaches 12/16/2013  . Hx of extrinsic asthma 12/16/2013  . Osteoarthritis of finger 12/13/2013    Past Surgical History:  Procedure  Laterality Date  . bilateral foot surgery     . left hand surgery     for arthritis   . PUBOVAGINAL SLING N/A 08/14/2020   Procedure: Gaynelle Arabian;  Surgeon: Ardis Hughs, MD;  Location: [facility] ORS;  Service: Urology;  Laterality: N/A;  . ROBOTIC ASSISTED LAPAROSCOPIC HYSTERECTOMY AND SALPINGECTOMY Bilateral 08/14/2020   Procedure: XI ROBOTIC [place] AND BILATERAL SALPINGO OOPHORECTOMY;  Surgeon: Ardis Hughs, MD;  Location: [facility];  Service: Urology;  Laterality: Bilateral;  . ROBOTIC ASSISTED LAPAROSCOPIC SACROCOLPOPEXY N/A 08/14/2020   Procedure: XI ROBOTIC  SACROCOLPOPEXY WITH CYSTOSCOPY;  Surgeon: Ardis Hughs, MD;  Location: [facility];  Service: Urology;  Laterality: N/A;     OB History   No obstetric history on file.     Family History  Problem Relation Age of Onset  . Hyperlipidemia Mother   . Cancer Father        prostate  . Diabetes Father   . Cancer Maternal Grandfather        colon cancer  . Stroke Maternal Grandfather   . Breast cancer Sister     Social History   Tobacco Use  . Smoking status: Never Smoker  . Smokeless tobacco: Never Used  Vaping Use  . Vaping Use: Never used  Substance Use Topics  . Alcohol use: No  . Drug use: No    Home Medications Prior to Admission medications   Medication Sig Start Date End Date Taking? Authorizing Provider  acetaminophen (TYLENOL) 500 MG tablet Take 1,000 mg by mouth every 6 (six)  hours as needed for moderate pain or headache.    [provider]  albuterol (PROAIR HFA) 108 (90 Base) MCG/ACT inhaler TAKE 2 PUFFS BY MOUTH EVERY 6 HOURS AS NEEDED FOR WHEEZE OR SHORTNESS OF BREATH 07/13/20   Breeback, Jade L, PA-C  AMBULATORY NON FORMULARY MEDICATION Continuous positive airway pressure (CPAP) machine auto-titrate from 4-20 cm of H2O pressure, with all supplemental supplies as needed. 02/28/19   Breeback, Jade L, PA-C  calcium carbonate (TUMS - DOSED IN MG ELEMENTAL CALCIUM) 500  MG chewable tablet Chew 1,000-1,500 mg by mouth daily as needed for indigestion or heartburn.    [provider]  cephALEXin (KEFLEX) 500 MG capsule Take 1 capsule (500 mg total) by mouth 2 (two) times daily. 08/14/20   Debbrah Alar, PA-C  diclofenac Sodium (VOLTAREN) 1 % GEL Apply 1 application topically 4 (four) times daily as needed (pain). 07/13/20   Breeback, Royetta Car, PA-C  estradiol (ESTRACE) 0.1 MG/GM vaginal cream Place 1 Applicatorful vaginally 3 (three) times a week. Best to not use the applicator, but instead to use 1 pea-sized dolyp of cream and gently swap the inside of the vagina every other day. 08/14/20 08/14/21  Ardis Hughs, MD  famotidine (PEPCID) 20 MG tablet Take 40 mg by mouth daily.    [provider]  fluticasone (FLONASE) 50 MCG/ACT nasal spray Place 2 sprays into both nostrils daily as needed for allergies. 07/13/20   Breeback, Jade L, PA-C  loratadine (CLARITIN) 10 MG tablet Take 10 mg by mouth daily.    [provider]  magnesium oxide (MAG-OX) 400 MG tablet Take 400 mg by mouth daily.    [provider]  Probiotic Product (PROBIOTIC PO) Take 1 capsule by mouth daily.    [provider]  sennosides-docusate sodium (SENOKOT-S) 8.6-50 MG tablet Take 2 tablets by mouth every 14 (fourteen) days.    [provider]  Simethicone (GAS-X PO) Take 2 tablets by mouth daily as needed (gas).    [provider]  simvastatin (ZOCOR) 40 MG tablet Take 1 tablet (40 mg total) by mouth daily at 6 PM. 07/13/20   Breeback, Jade L, PA-C  traMADol (ULTRAM) 50 MG tablet Take 1-2 tablets (50-100 mg total) by mouth every 6 (six) hours as needed for moderate pain or severe pain. 08/14/20   Debbrah Alar, PA-C    Allergies    Patient has no known allergies.  Review of Systems   Review of Systems  All other systems reviewed and are negative.   Physical Exam Updated Vital Signs BP 136/65   Pulse 78   Temp 98.7 F (37.1 C)  (Oral)   Resp 18   Ht 1.626 m (5\' 4" )   Wt 76.2 kg   SpO2 98%   BMI 28.84 kg/m   Physical Exam Vitals and nursing note reviewed.  Constitutional:      General: She is not in acute distress.    Appearance: She is well-developed.  HENT:     Head: Normocephalic and atraumatic.     Right Ear: External ear normal.     Left Ear: External ear normal.  Eyes:     General: No scleral icterus.       Right eye: No discharge.        Left eye: No discharge.     Conjunctiva/sclera: Conjunctivae normal.  Neck:     Trachea: No tracheal deviation.  Cardiovascular:     Rate and Rhythm: Normal rate and regular rhythm.  Pulmonary:     Effort: Pulmonary effort is normal. No respiratory distress.     Breath sounds: Normal breath sounds. No stridor. No wheezing or rales.  Abdominal:     General: Bowel sounds are normal. There is no distension.     Palpations: Abdomen is soft.     Tenderness: There is abdominal tenderness. There is no guarding or rebound.     Comments: Appropriate tenderness around the incision site, fullness in the suprapubic region  Musculoskeletal:        General: No tenderness.     Cervical back: Neck supple.  Skin:    General: Skin is warm and dry.     Findings: No rash.  Neurological:     Mental Status: She is alert.     Cranial Nerves: No cranial nerve deficit (no facial droop, extraocular movements intact, no slurred speech).     Sensory: No sensory deficit.     Motor: No abnormal muscle tone or seizure activity.     Coordination: Coordination normal.     ED Results / Procedures / Treatments   Labs (all labs ordered are listed, but only abnormal results are displayed) Labs Reviewed  BASIC METABOLIC PANEL - Abnormal; Notable for the following components:      Result Value   Potassium 3.4 (*)    Glucose, Bld 124 (*)    All other components within normal limits  URINALYSIS, ROUTINE W REFLEX MICROSCOPIC - Abnormal; Notable for the following components:   Hgb  urine dipstick SMALL (*)    All other components within normal limits  CBC - Abnormal; Notable for the following components:   Hemoglobin 11.8 (*)    HCT 34.7 (*)    All other components within normal limits     Procedures Procedures   Medications Ordered in ED Medications - No data to display  ED Course  I have reviewed the triage vital signs and the nursing notes.  Pertinent labs & imaging results that were available during my care of the patient were reviewed by me and considered in my medical decision making (see chart for details).  Clinical Course as of 08/19/20 2236  Wed Aug 19, 2020  2110 Foley catheter placed.  Patient is draining large amount of urine [JK]  2226 Urinalysis without signs of infection.  Laboratory test unremarkable. [JK]    Clinical Course User Index [JK] Dorie Rank, MD   MDM Rules/Calculators/A&P                          Patient presented with urinary retention.  Laboratory tests are unremarkable.  No signs of urine infection.  Patient had approximately 500 cc of urine in the bladder.  Symptoms improved after placement of Foley catheter.  Will discharge home with Foley catheter in place.  Final Clinical Impression(s) / ED Diagnoses Final diagnoses:  Urinary retention    Rx / DC Orders ED Discharge Orders    None       Dorie Rank, MD 08/19/20 2237

## 2020-08-21 ENCOUNTER — Telehealth: Payer: Self-pay | Admitting: General Practice

## 2020-08-21 NOTE — Telephone Encounter (Signed)
Transition Care Management Unsuccessful Follow-up Telephone Call  Date of discharge and from where:  [facility] on 08/19/20  Attempts:  1st Attempt  Reason for unsuccessful TCM follow-up call:  Left voice message

## 2020-08-24 NOTE — Telephone Encounter (Signed)
Transition Care Management Follow-up Telephone Call  Date of discharge and from where: [name] on 08/19/20  How have you been since you were released from the hospital? Doing ok.  Any questions or concerns? Yes  Items Reviewed:  Did the pt receive and understand the discharge instructions provided? Yes   Medications obtained and verified? Yes  Her husband said all of the medications are the same as far as he knows.  Other? No   Any new allergies since your discharge? No   Dietary orders reviewed? Yes  Do you have support at home? Yes   Functional Questionnaire: (I = Independent and D = Dependent) ADLs: I  Bathing/Dressing- I  Meal Prep- I  Eating- I  Maintaining continence- I  Transferring/Ambulation- I  Managing Meds- I  Follow up appointments reviewed:   PCP Hospital f/u appt confirmed? Her husband will have Juliann Pulse schedule her follow up after her appointment with urology.  [facility] f/u appt confirmed? Yes  Scheduled to see Dr. Quentin Ore on 08/28/2020 @ 10.  Are transportation arrangements needed? No   If their condition worsens, is the pt aware to call PCP or go to the Emergency Dept.? Yes  Was the patient provided with contact information for the PCP's office or ED? Yes  Was to pt encouraged to call back with questions or concerns? Yes

## 2020-08-28 DIAGNOSIS — N3000 Acute cystitis without hematuria: Secondary | ICD-10-CM | POA: Diagnosis not present

## 2020-08-28 DIAGNOSIS — N819 Female genital prolapse, unspecified: Secondary | ICD-10-CM | POA: Diagnosis not present

## 2020-09-01 ENCOUNTER — Other Ambulatory Visit: Payer: Self-pay

## 2020-09-01 ENCOUNTER — Encounter: Payer: Self-pay | Admitting: Physician Assistant

## 2020-09-01 ENCOUNTER — Ambulatory Visit (INDEPENDENT_AMBULATORY_CARE_PROVIDER_SITE_OTHER): Payer: Medicare Other | Admitting: Physician Assistant

## 2020-09-01 VITALS — BP 182/82 | HR 96 | Ht 64.0 in | Wt 161.0 lb

## 2020-09-01 DIAGNOSIS — E876 Hypokalemia: Secondary | ICD-10-CM

## 2020-09-01 DIAGNOSIS — R339 Retention of urine, unspecified: Secondary | ICD-10-CM

## 2020-09-01 DIAGNOSIS — R030 Elevated blood-pressure reading, without diagnosis of hypertension: Secondary | ICD-10-CM | POA: Insufficient documentation

## 2020-09-01 DIAGNOSIS — Z90710 Acquired absence of both cervix and uterus: Secondary | ICD-10-CM

## 2020-09-01 MED ORDER — LISINOPRIL 5 MG PO TABS: 5.0000 mg | ORAL_TABLET | Freq: Every day | ORAL | 1 refills | Status: DC

## 2020-09-01 NOTE — Progress Notes (Signed)
Subjective:    Patient ID: Misty [name], female    DOB: 1955-09-28, 65 y.o.   MRN: [id number]  HPI  Pt is a 65 yo female with recent hx of hysterectomy on 08/14/2020. She is accompanied by interpretor.  She was not able to urinate after surgery and went to ED on 3/23. No infection but foley was placed. It was removed last Friday but she still could not urinate. She has follow up today. 3.4 potassium in ED on 3/23. Normal serum creatinine. No CP, palpitations, SOB, edema, headaches, dizziness. Not checking BP at home.   .. Active Ambulatory Problems    Diagnosis Date Noted  . 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 Essential hypertension, benign 12/16/2013  . Hyperlipidemia 12/16/2013  . Frequent headaches 12/16/2013  . Hx of extrinsic asthma 12/16/2013  . Pre-diabetes 12/27/2013  . Polyp, cervix 09/09/2015  . Midline low back pain without sciatica 09/09/2015  . Trigger finger, right 05/17/2016  . Gluteal tendonitis 07/25/2016  . Screening for breast cancer 07/25/2016  . Fatigue 12/05/2016  . Osteopenia determined by x-ray 12/07/2016  . OSA (obstructive sleep apnea) 01/02/2017  . Dyspnea 04/13/2017  . GERD (gastroesophageal reflux disease) 04/13/2017  . Vaginal dryness, menopausal 01/31/2018  . Female cystocele 02/12/2019  . Constipation 02/12/2019  . Female genital prolapse, unspecified 04/15/2020  . Stress incontinence 04/15/2020  . Primary localized osteoarthrosis of hands, bilateral 07/15/2020  . Bilateral hand pain 07/21/2020  . Chronic rhinitis 07/21/2020  . Reactive airway disease 07/21/2020  . Cystocele with prolapse 08/14/2020  . Hypokalemia 09/01/2020  . Urinary retention 09/01/2020  . Status post hysterectomy 09/01/2020  . Elevated blood pressure reading without diagnosis of hypertension 09/01/2020   Resolved Ambulatory Problems    Diagnosis Date Noted  . Finger pain, right 05/17/2016   Past Medical History:  Diagnosis Date  . Anxiety   . Arthritis   .  Asthma   . Complication of anesthesia   . Deaf   . Headache   . History of kidney stones   . Sleep apnea        Review of Systems  All other systems reviewed and are negative.      Objective:   Physical Exam Vitals reviewed.  Constitutional:      Appearance: Normal appearance.  Cardiovascular:     Rate and Rhythm: Normal rate and regular rhythm.     Pulses: Normal pulses.  Pulmonary:     Effort: Pulmonary effort is normal.     Breath sounds: Normal breath sounds.  Abdominal:     Palpations: Abdomen is soft.     Tenderness: There is abdominal tenderness. There is no right CVA tenderness, left CVA tenderness, guarding or rebound.  Neurological:     General: No focal deficit present.     Mental Status: She is alert.  Psychiatric:        Mood and Affect: Mood normal.           Assessment & Plan:  Marland KitchenMarland KitchenXitlalli was seen today for follow-up.  Diagnoses and all orders for this visit:  Urinary retention -     BASIC METABOLIC PANEL WITH GFR  Hypokalemia -     BASIC METABOLIC PANEL WITH GFR  Status post hysterectomy  Elevated blood pressure reading without diagnosis of hypertension -     lisinopril (ZESTRIL) 5 MG tablet; Take 1 tablet (5 mg total) by mouth daily. -     BASIC METABOLIC PANEL WITH GFR   Elevated BP reading. 2nd  check the same. Asymptomatic.  No signs of infection. No hx of HTN. 3/23 hypokalemic. Do not want to start HCTZ.  Will start lisinopril 5mg  if home BP readings are above 140/90 for the next 24 hours.  Recheck BMP.   Follow up BP check in 2 weeks.   Urology-recheck foley.   Spent 30 minutes with patient reviewing records, medications, labs and discussing plan.

## 2020-09-01 NOTE — Patient Instructions (Signed)
Start BP medication if BP above 140/90 in next 24 hours.

## 2020-09-04 ENCOUNTER — Other Ambulatory Visit: Payer: Self-pay | Admitting: Urology

## 2020-09-04 DIAGNOSIS — E876 Hypokalemia: Secondary | ICD-10-CM | POA: Diagnosis not present

## 2020-09-05 LAB — BASIC METABOLIC PANEL WITH GFR
BUN/Creatinine Ratio: 48 (calc) — ABNORMAL HIGH (ref 6–22)
BUN: 29 mg/dL — ABNORMAL HIGH (ref 7–25)
CO2: 30 mmol/L (ref 20–32)
Calcium: 10.5 mg/dL — ABNORMAL HIGH (ref 8.6–10.4)
Chloride: 106 mmol/L (ref 98–110)
Creat: 0.61 mg/dL (ref 0.50–0.99)
GFR, Est African American: 110 mL/min/{1.73_m2} (ref >=60)
GFR, Est Non African American: 95 mL/min/{1.73_m2} (ref >=60)
Glucose, Bld: 87 mg/dL (ref 65–139)
Potassium: 3.7 mmol/L (ref 3.5–5.3)
Sodium: 146 mmol/L (ref 135–146)

## 2020-09-07 NOTE — Progress Notes (Signed)
Potassium looks great.

## 2020-09-21 ENCOUNTER — Ambulatory Visit (INDEPENDENT_AMBULATORY_CARE_PROVIDER_SITE_OTHER): Payer: Medicare Other | Admitting: Physician Assistant

## 2020-09-21 ENCOUNTER — Other Ambulatory Visit: Payer: Self-pay

## 2020-09-21 VITALS — BP 147/68 | HR 94 | Ht 64.0 in | Wt 158.0 lb

## 2020-09-21 DIAGNOSIS — I10 Essential (primary) hypertension: Secondary | ICD-10-CM | POA: Diagnosis not present

## 2020-09-21 DIAGNOSIS — R339 Retention of urine, unspecified: Secondary | ICD-10-CM

## 2020-09-21 DIAGNOSIS — R0789 Other chest pain: Secondary | ICD-10-CM | POA: Diagnosis not present

## 2020-09-21 NOTE — Progress Notes (Signed)
Subjective:    Patient ID: Misty [name], female    DOB: [birth year], 65 y.o.   MRN: [id number]  HPI  Patient is a 65 year old deaf female who presents to the clinic for blood pressure follow-up.  We are accompanied by sign language interpreter.  On 08/14/20 patient had a hysterectomy due to a cystocele with prolapse.  Since she has had some complication with urinary retention.  She no longer has a Foley but she does have to do in and out self caths.  When she followed up in our office her blood pressure was also high.  She is checking her blood pressure at home and states that she is under 140/90 with rechecks.  She denies any chest pain, palpitation, headache, vision changes, dizziness.  She never started the lisinopril.  Patient does report occasional chest tightness.  This is not with exertion but really sporadically and while sitting.  Does not seem to last very long.  She denies any cough, shortness of breath, swelling, fever, abdominal pain.  She denies any symptoms related to eating.  She denies any constipation or diarrhea.  Patient does have a procedure scheduled for 5/5 urethral lysisurethral sling take down.  .. Active Ambulatory Problems    Diagnosis Date Noted  . 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 Essential hypertension, benign 12/16/2013  . Hyperlipidemia 12/16/2013  . Frequent headaches 12/16/2013  . Hx of extrinsic asthma 12/16/2013  . Pre-diabetes 12/27/2013  . Polyp, cervix 09/09/2015  . Midline low back pain without sciatica 09/09/2015  . Trigger finger, right 05/17/2016  . Gluteal tendonitis 07/25/2016  . Screening for breast cancer 07/25/2016  . Fatigue 12/05/2016  . Osteopenia determined by x-ray 12/07/2016  . OSA (obstructive sleep apnea) 01/02/2017  . Dyspnea 04/13/2017  . GERD (gastroesophageal reflux disease) 04/13/2017  . Vaginal dryness, menopausal 01/31/2018  . Female cystocele 02/12/2019  . Constipation 02/12/2019  . Female genital prolapse,  unspecified 04/15/2020  . Stress incontinence 04/15/2020  . Primary localized osteoarthrosis of hands, bilateral 07/15/2020  . Bilateral hand pain 07/21/2020  . Chronic rhinitis 07/21/2020  . Reactive airway disease 07/21/2020  . Cystocele with prolapse 08/14/2020  . Hypokalemia 09/01/2020  . Urinary retention 09/01/2020  . Status post hysterectomy 09/01/2020  . Elevated blood pressure reading without diagnosis of hypertension 09/01/2020   Resolved Ambulatory Problems    Diagnosis Date Noted  . Finger pain, right 05/17/2016   Past Medical History:  Diagnosis Date  . Anxiety   . Arthritis   . Asthma   . Complication of anesthesia   . Deaf   . Headache   . History of kidney stones   . Sleep apnea       Review of Systems See HPI.     Objective:   Physical Exam Vitals reviewed.  Constitutional:      Appearance: Normal appearance.  Cardiovascular:     Rate and Rhythm: Normal rate and regular rhythm.     Pulses: Normal pulses.  Pulmonary:     Effort: Pulmonary effort is normal.     Breath sounds: Normal breath sounds.  Abdominal:     General: Bowel sounds are normal. There is no distension.     Palpations: Abdomen is soft.     Tenderness: There is no abdominal tenderness. There is no right CVA tenderness, left CVA tenderness or guarding.  Musculoskeletal:     Right lower leg: No edema.     Left lower leg: No edema.  Neurological:  General: No focal deficit present.     Mental Status: She is alert.  Psychiatric:        Mood and Affect: Mood normal.           Assessment & Plan:  Marland KitchenMarland KitchenNahima was seen today for hypertension.  Diagnoses and all orders for this visit:  Essential hypertension, benign  Urinary retention   Patient's blood pressure is 147/68 today.  Discussed this still would like the systolic reading to be under 140.  Patient states she is getting these numbers at home.  She does not want to start a medication if she does not have to.  It is  also suspected that perhaps her post hysterectomy complications of urinary retention have affected blood pressures.  We will continue to monitor.  If she notices blood pressures ranging at home above 140/90 please contact office and start lisinopril that was sent to the pharmacy.  Recheck blood pressure in 3 months.  Please bring in log.  Chest tightness seems to be related to anxiety. Symptoms not like cardiac chest pain and no GERD symptoms. No other URI symptoms as well. If continuing or worsening please follow back up for work up. No swelling or leg pain low risk for DVT.   Continue follow-up with urology for urinary retention and postsurgical complications.

## 2020-09-23 ENCOUNTER — Encounter: Payer: Self-pay | Admitting: Physician Assistant

## 2020-09-23 DIAGNOSIS — R0789 Other chest pain: Secondary | ICD-10-CM | POA: Insufficient documentation

## 2020-09-29 ENCOUNTER — Other Ambulatory Visit (HOSPITAL_COMMUNITY)
Admission: RE | Admit: 2020-09-29 | Discharge: 2020-09-29 | Disposition: A | Discharge status: Home / Self Care | Payer: Medicare Other | Source: Ambulatory Visit | Attending: Urology | Admitting: Urology

## 2020-09-29 ENCOUNTER — Other Ambulatory Visit: Payer: Self-pay | Admitting: Physician Assistant

## 2020-09-29 DIAGNOSIS — R338 Other retention of urine: Secondary | ICD-10-CM | POA: Diagnosis not present

## 2020-09-29 DIAGNOSIS — Z20822 Contact with and (suspected) exposure to covid-19: Secondary | ICD-10-CM | POA: Insufficient documentation

## 2020-09-29 DIAGNOSIS — Z01812 Encounter for preprocedural laboratory examination: Secondary | ICD-10-CM | POA: Diagnosis not present

## 2020-09-30 ENCOUNTER — Encounter (HOSPITAL_BASED_OUTPATIENT_CLINIC_OR_DEPARTMENT_OTHER): Payer: Self-pay | Admitting: Urology

## 2020-09-30 ENCOUNTER — Other Ambulatory Visit: Payer: Self-pay

## 2020-09-30 LAB — SARS CORONAVIRUS 2 (TAT 6-24 HRS): SARS Coronavirus 2: NEGATIVE

## 2020-09-30 NOTE — Anesthesia Preprocedure Evaluation (Addendum)
Anesthesia Evaluation  Patient identified by MRN, date of birth, ID band Patient awake    Reviewed: Allergy & Precautions, NPO status , Patient's Chart, lab work & pertinent test results  History of Anesthesia Complications (+) PONV and history of anesthetic complications  Airway Mallampati: I  TM Distance: >3 FB Neck ROM: Full    Dental no notable dental hx.    Pulmonary asthma , sleep apnea and Continuous Positive Airway Pressure Ventilation ,    Pulmonary exam normal        Cardiovascular hypertension, Normal cardiovascular exam     Neuro/Psych  Headaches, Anxiety    GI/Hepatic Neg liver ROS, GERD  Controlled and Medicated,  Endo/Other  negative endocrine ROS  Renal/GU negative Renal ROS   Urinary retention    Musculoskeletal  (+) Arthritis ,   Abdominal   Peds  Hematology negative hematology ROS (+)   Anesthesia Other Findings Day of surgery medications reviewed with patient.  Reproductive/Obstetrics negative OB ROS                            Anesthesia Physical Anesthesia Plan  ASA: II  Anesthesia Plan: General   Post-op Pain Management:    Induction: Intravenous  PONV Risk Score and Plan: 4 or greater and Treatment may vary due to age or medical condition, Ondansetron, Dexamethasone, Midazolam and Scopolamine patch - Pre-op  Airway Management Planned: LMA  Additional Equipment: None  Intra-op Plan:   Post-operative Plan: Extubation in OR  Informed Consent: I have reviewed the patients History and Physical, chart, labs and discussed the procedure including the risks, benefits and alternatives for the proposed anesthesia with the patient or authorized representative who has indicated his/her understanding and acceptance.     Dental advisory given and Interpreter used for interveiw  Plan Discussed with: CRNA  Anesthesia Plan Comments: (ASL interpreter used)      Anesthesia Quick Evaluation

## 2020-09-30 NOTE — Progress Notes (Signed)
Spoke w/ via phone for pre-op interview--- PT Lab needs dos---- Istat and EKG              Lab results------ no COVID test ------ done 09-29-2020 negative result in epic  Arrive at ------- 0715 on 10-01-2020 NPO after MN NO Solid Food.  Clear liquids from MN until--- 0615 Med rec completed Medications to take morning of surgery ----- Pepcid, Claritin, if needed do flonase spray Diabetic medication ----- Patient instructed to bring photo id and insurance card day of surgery Patient aware to have Driver (ride ) / caregiver    for 24 hours after surgery-- son, Shataria Crist will drop-off and pick-up  Patient Special Instructions ----- asked to bring rescue inhaler  Pre-Op special Istructions ----- request american sign language interpreter via email to [employer] with specifically Britt Bolognese per pt , copy of email with chart  Patient verbalized understanding of instructions that were given at this phone interview. Patient denies shortness of breath, chest pain, fever, cough at this phone interview.

## 2020-10-01 ENCOUNTER — Encounter (HOSPITAL_BASED_OUTPATIENT_CLINIC_OR_DEPARTMENT_OTHER)
Admission: RE | Disposition: A | Discharge status: Home / Self Care | Payer: Self-pay | Source: Home / Self Care | Attending: Urology

## 2020-10-01 ENCOUNTER — Other Ambulatory Visit: Payer: Self-pay

## 2020-10-01 ENCOUNTER — Ambulatory Visit (HOSPITAL_BASED_OUTPATIENT_CLINIC_OR_DEPARTMENT_OTHER): Payer: Medicare Other | Admitting: Anesthesiology

## 2020-10-01 ENCOUNTER — Ambulatory Visit (HOSPITAL_BASED_OUTPATIENT_CLINIC_OR_DEPARTMENT_OTHER)
Admission: RE | Admit: 2020-10-01 | Discharge: 2020-10-01 | Disposition: A | Discharge status: Home / Self Care | Payer: Medicare Other | Attending: Urology | Admitting: Urology

## 2020-10-01 ENCOUNTER — Encounter (HOSPITAL_BASED_OUTPATIENT_CLINIC_OR_DEPARTMENT_OTHER): Payer: Self-pay | Admitting: Urology

## 2020-10-01 DIAGNOSIS — R338 Other retention of urine: Secondary | ICD-10-CM | POA: Diagnosis not present

## 2020-10-01 DIAGNOSIS — Z90710 Acquired absence of both cervix and uterus: Secondary | ICD-10-CM | POA: Insufficient documentation

## 2020-10-01 DIAGNOSIS — I10 Essential (primary) hypertension: Secondary | ICD-10-CM | POA: Diagnosis not present

## 2020-10-01 DIAGNOSIS — N393 Stress incontinence (female) (male): Secondary | ICD-10-CM | POA: Diagnosis not present

## 2020-10-01 DIAGNOSIS — Z9889 Other specified postprocedural states: Secondary | ICD-10-CM | POA: Diagnosis not present

## 2020-10-01 DIAGNOSIS — Z79899 Other long term (current) drug therapy: Secondary | ICD-10-CM | POA: Insufficient documentation

## 2020-10-01 DIAGNOSIS — R339 Retention of urine, unspecified: Secondary | ICD-10-CM

## 2020-10-01 DIAGNOSIS — E876 Hypokalemia: Secondary | ICD-10-CM | POA: Diagnosis not present

## 2020-10-01 DIAGNOSIS — E785 Hyperlipidemia, unspecified: Secondary | ICD-10-CM | POA: Diagnosis not present

## 2020-10-01 DIAGNOSIS — T8389XA Other specified complication of genitourinary prosthetic devices, implants and grafts, initial encounter: Secondary | ICD-10-CM | POA: Diagnosis not present

## 2020-10-01 HISTORY — DX: Other specified health status: Z78.9

## 2020-10-01 HISTORY — DX: Mild intermittent asthma, uncomplicated: J45.20

## 2020-10-01 HISTORY — DX: Unspecified hearing loss, bilateral: H91.93

## 2020-10-01 HISTORY — DX: Other specified postprocedural states: R11.2

## 2020-10-01 HISTORY — DX: Other seasonal allergic rhinitis: J30.2

## 2020-10-01 HISTORY — DX: Obstructive sleep apnea (adult) (pediatric): Z99.89

## 2020-10-01 HISTORY — DX: Presence of spectacles and contact lenses: Z97.3

## 2020-10-01 HISTORY — DX: Obstructive sleep apnea (adult) (pediatric): G47.33

## 2020-10-01 HISTORY — DX: Other specified postprocedural states: Z98.890

## 2020-10-01 HISTORY — DX: Unspecified osteoarthritis, unspecified site: M19.90

## 2020-10-01 HISTORY — DX: Nausea with vomiting, unspecified: Z98.890

## 2020-10-01 HISTORY — PX: PUBOVAGINAL SLING: SHX1035

## 2020-10-01 HISTORY — DX: Retention of urine, unspecified: R33.9

## 2020-10-01 HISTORY — DX: Other rubella complications: B06.89

## 2020-10-01 LAB — POCT I-STAT, CHEM 8
BUN: 14 mg/dL (ref 8–23)
Calcium, Ion: 1.33 mmol/L (ref 1.15–1.40)
Chloride: 108 mmol/L (ref 98–111)
Creatinine, Ser: 0.4 mg/dL — ABNORMAL LOW (ref 0.44–1.00)
Glucose, Bld: 137 mg/dL — ABNORMAL HIGH (ref 70–99)
HCT: 38 % (ref 36.0–46.0)
Hemoglobin: 12.9 g/dL (ref 12.0–15.0)
Potassium: 3.9 mmol/L (ref 3.5–5.1)
Sodium: 144 mmol/L (ref 135–145)
TCO2: 24 mmol/L (ref 22–32)

## 2020-10-01 SURGERY — CREATION, PUBOVAGINAL SLING
Anesthesia: General | Site: Urethra

## 2020-10-01 MED ORDER — FENTANYL CITRATE (PF) 100 MCG/2ML IJ SOLN: 25.0000 ug | INTRAMUSCULAR | Status: DC | PRN

## 2020-10-01 MED ORDER — PROPOFOL 10 MG/ML IV BOLUS
INTRAVENOUS | Status: AC
  Filled 2020-10-01: qty 20

## 2020-10-01 MED ORDER — ONDANSETRON HCL 4 MG/2ML IJ SOLN
INTRAMUSCULAR | Status: DC | PRN
  Administered 2020-10-01: 4 mg via INTRAVENOUS

## 2020-10-01 MED ORDER — SCOPOLAMINE 1 MG/3DAYS TD PT72
1.0000 | MEDICATED_PATCH | Freq: Once | TRANSDERMAL | Status: DC
  Administered 2020-10-01: 1.5 mg via TRANSDERMAL

## 2020-10-01 MED ORDER — KETOROLAC TROMETHAMINE 30 MG/ML IJ SOLN
INTRAMUSCULAR | Status: AC
  Filled 2020-10-01: qty 1

## 2020-10-01 MED ORDER — ONDANSETRON HCL 4 MG/2ML IJ SOLN
INTRAMUSCULAR | Status: AC
  Filled 2020-10-01: qty 2

## 2020-10-01 MED ORDER — ESTRADIOL 0.1 MG/GM VA CREA
TOPICAL_CREAM | VAGINAL | Status: DC | PRN
  Administered 2020-10-01: 1 via VAGINAL

## 2020-10-01 MED ORDER — ACETAMINOPHEN 500 MG PO TABS
ORAL_TABLET | ORAL | Status: AC
  Filled 2020-10-01: qty 2

## 2020-10-01 MED ORDER — PROPOFOL 10 MG/ML IV BOLUS
INTRAVENOUS | Status: DC | PRN
  Administered 2020-10-01: 150 mg via INTRAVENOUS

## 2020-10-01 MED ORDER — CEFAZOLIN SODIUM-DEXTROSE 2-4 GM/100ML-% IV SOLN
INTRAVENOUS | Status: AC
  Filled 2020-10-01: qty 100

## 2020-10-01 MED ORDER — FENTANYL CITRATE (PF) 100 MCG/2ML IJ SOLN
INTRAMUSCULAR | Status: AC
  Filled 2020-10-01: qty 2

## 2020-10-01 MED ORDER — CLINDAMYCIN PHOSPHATE 2 % VA CREA
1.0000 | TOPICAL_CREAM | Freq: Once | VAGINAL | Status: DC
  Filled 2020-10-01: qty 40

## 2020-10-01 MED ORDER — GENTAMICIN SULFATE 40 MG/ML IJ SOLN
5.0000 mg/kg | INTRAVENOUS | Status: AC
  Administered 2020-10-01: 310 mg via INTRAVENOUS
  Filled 2020-10-01: qty 7.75

## 2020-10-01 MED ORDER — LACTATED RINGERS IV SOLN: INTRAVENOUS | Status: DC

## 2020-10-01 MED ORDER — OXYCODONE HCL 5 MG/5ML PO SOLN: 5.0000 mg | Freq: Once | ORAL | Status: DC | PRN

## 2020-10-01 MED ORDER — MIDAZOLAM HCL 5 MG/5ML IJ SOLN
INTRAMUSCULAR | Status: DC | PRN
  Administered 2020-10-01: 2 mg via INTRAVENOUS

## 2020-10-01 MED ORDER — TRAMADOL HCL 50 MG PO TABS: 50.0000 mg | ORAL_TABLET | Freq: Four times a day (QID) | ORAL | 0 refills | Status: DC | PRN

## 2020-10-01 MED ORDER — STERILE WATER FOR IRRIGATION IR SOLN
Status: DC | PRN
  Administered 2020-10-01: 3000 mL via INTRAVESICAL

## 2020-10-01 MED ORDER — SCOPOLAMINE 1 MG/3DAYS TD PT72
MEDICATED_PATCH | TRANSDERMAL | Status: AC
  Filled 2020-10-01: qty 1

## 2020-10-01 MED ORDER — LIDOCAINE 2% (20 MG/ML) 5 ML SYRINGE
INTRAMUSCULAR | Status: DC | PRN
  Administered 2020-10-01: 100 mg via INTRAVENOUS

## 2020-10-01 MED ORDER — PROMETHAZINE HCL 25 MG/ML IJ SOLN: 6.2500 mg | INTRAMUSCULAR | Status: DC | PRN

## 2020-10-01 MED ORDER — DEXAMETHASONE SODIUM PHOSPHATE 10 MG/ML IJ SOLN
INTRAMUSCULAR | Status: AC
  Filled 2020-10-01: qty 1

## 2020-10-01 MED ORDER — DEXAMETHASONE SODIUM PHOSPHATE 10 MG/ML IJ SOLN
INTRAMUSCULAR | Status: DC | PRN
  Administered 2020-10-01: 8 mg via INTRAVENOUS

## 2020-10-01 MED ORDER — FENTANYL CITRATE (PF) 100 MCG/2ML IJ SOLN
INTRAMUSCULAR | Status: DC | PRN
  Administered 2020-10-01: 100 ug via INTRAVENOUS

## 2020-10-01 MED ORDER — OXYCODONE HCL 5 MG PO TABS
5.0000 mg | ORAL_TABLET | Freq: Once | ORAL | Status: DC | PRN
Start: 2020-10-01 — End: 2020-10-01

## 2020-10-01 MED ORDER — MIDAZOLAM HCL 2 MG/2ML IJ SOLN
INTRAMUSCULAR | Status: AC
  Filled 2020-10-01: qty 2

## 2020-10-01 MED ORDER — ACETAMINOPHEN 500 MG PO TABS
1000.0000 mg | ORAL_TABLET | Freq: Once | ORAL | Status: AC
  Administered 2020-10-01: 1000 mg via ORAL

## 2020-10-01 MED ORDER — CEFAZOLIN SODIUM-DEXTROSE 2-4 GM/100ML-% IV SOLN
2.0000 g | INTRAVENOUS | Status: AC
  Administered 2020-10-01: 2 g via INTRAVENOUS

## 2020-10-01 MED ORDER — KETOROLAC TROMETHAMINE 30 MG/ML IJ SOLN
INTRAMUSCULAR | Status: DC | PRN
  Administered 2020-10-01: 30 mg via INTRAVENOUS

## 2020-10-01 MED ORDER — LIDOCAINE-EPINEPHRINE (PF) 1 %-1:200000 IJ SOLN
INTRAMUSCULAR | Status: DC | PRN
  Administered 2020-10-01: 6 mL

## 2020-10-01 MED ORDER — LIDOCAINE 2% (20 MG/ML) 5 ML SYRINGE
INTRAMUSCULAR | Status: AC
  Filled 2020-10-01: qty 5

## 2020-10-01 SURGICAL SUPPLY — 46 items
BAG DRN RND TRDRP ANRFLXCHMBR (UROLOGICAL SUPPLIES) ×1
BAG URINE DRAIN 2000ML AR STRL (UROLOGICAL SUPPLIES) ×2 IMPLANT
BLADE SURG 15 STRL LF DISP TIS (BLADE) ×1 IMPLANT
BLADE SURG 15 STRL SS (BLADE) ×2
BNDG GAUZE ELAST 4 BULKY (GAUZE/BANDAGES/DRESSINGS) ×2 IMPLANT
CANISTER SUCT 1200ML W/VALVE (MISCELLANEOUS) ×2 IMPLANT
CATH FOLEY 2WAY SLVR  5CC 16FR (CATHETERS) ×1
CATH FOLEY 2WAY SLVR 5CC 16FR (CATHETERS) ×1 IMPLANT
COVER BACK TABLE 60X90IN (DRAPES) ×2 IMPLANT
COVER MAYO STAND STRL (DRAPES) ×2 IMPLANT
COVER WAND RF STERILE (DRAPES) ×2 IMPLANT
DRAPE HYSTEROSCOPY (MISCELLANEOUS) ×2 IMPLANT
DRAPE SHEET LG 3/4 BI-LAMINATE (DRAPES) ×2 IMPLANT
ELECT REM PT RETURN 9FT ADLT (ELECTROSURGICAL) ×2
ELECTRODE REM PT RTRN 9FT ADLT (ELECTROSURGICAL) ×1 IMPLANT
GLOVE SURG ENC MOIS LTX SZ7.5 (GLOVE) ×2 IMPLANT
GLOVE SURG ENC TEXT LTX SZ7 (GLOVE) ×2 IMPLANT
GLOVE SURG UNDER POLY LF SZ7 (GLOVE) ×6 IMPLANT
GLOVE SURG UNDER POLY LF SZ7.5 (GLOVE) ×2 IMPLANT
GOWN STRL REUS W/TWL LRG LVL3 (GOWN DISPOSABLE) ×6 IMPLANT
GOWN STRL REUS W/TWL XL LVL3 (GOWN DISPOSABLE) ×2 IMPLANT
HOLDER FOLEY CATH W/STRAP (MISCELLANEOUS) ×2 IMPLANT
KIT TURNOVER CYSTO (KITS) ×2 IMPLANT
MANIFOLD NEPTUNE II (INSTRUMENTS) ×2 IMPLANT
NEEDLE HYPO 22GX1.5 SAFETY (NEEDLE) ×2 IMPLANT
PACK BASIN DAY SURGERY FS (CUSTOM PROCEDURE TRAY) ×2 IMPLANT
PENCIL SMOKE EVACUATOR (MISCELLANEOUS) ×2 IMPLANT
PLUG CATH AND CAP STER (CATHETERS) ×2 IMPLANT
RETRACTOR LONRSTAR 16.6X16.6CM (MISCELLANEOUS) ×1 IMPLANT
RETRACTOR STAY HOOK 5MM (MISCELLANEOUS) ×2 IMPLANT
RETRACTOR STER APS 16.6X16.6CM (MISCELLANEOUS) ×2
SET IRRIG Y TYPE TUR BLADDER L (SET/KITS/TRAYS/PACK) ×2 IMPLANT
SHEET LAVH (DRAPES) ×2 IMPLANT
SUCTION FRAZIER HANDLE 10FR (MISCELLANEOUS) ×1
SUCTION TUBE FRAZIER 10FR DISP (MISCELLANEOUS) ×1 IMPLANT
SURGILUBE 2OZ TUBE FLIPTOP (MISCELLANEOUS) ×2 IMPLANT
SUT VIC AB 2-0 SH 27 (SUTURE) ×2
SUT VIC AB 2-0 SH 27XBRD (SUTURE) ×1 IMPLANT
SYR 10ML LL (SYRINGE) ×4 IMPLANT
SYR BULB IRRIG 60ML STRL (SYRINGE) ×2 IMPLANT
SYR CONTROL 10ML LL (SYRINGE) ×2 IMPLANT
TOWEL OR 17X26 10 PK STRL BLUE (TOWEL DISPOSABLE) ×2 IMPLANT
TRAY DSU PREP LF (CUSTOM PROCEDURE TRAY) ×2 IMPLANT
TUBE CONNECTING 12X1/4 (SUCTIONS) ×4 IMPLANT
WATER STERILE IRR 3000ML UROMA (IV SOLUTION) ×2 IMPLANT
WATER STERILE IRR 500ML POUR (IV SOLUTION) ×2 IMPLANT

## 2020-10-01 NOTE — Interval H&P Note (Signed)
History and Physical Interval Note:  10/01/2020 8:55 AM  Misty [name]  has presented today for surgery, with the diagnosis of URINARY RETENTION.  The various methods of treatment have been discussed with the patient and family. After consideration of risks, benefits and other options for treatment, the patient has consented to  Procedure(s) with comments: [place] (N/A) - 108 MINS as a surgical intervention.  The patient's history has been reviewed, patient examined, no change in status, stable for surgery.  I have reviewed the patient's chart and labs.  Questions were answered to the patient's satisfaction.     Ardis Hughs

## 2020-10-01 NOTE — Op Note (Signed)
Preoperative diagnosis:  1. Urinary retention status post stress urinary incontinence procedure.  Postoperative diagnosis:  1. Same  Procedure: 1. Urethral lysis and sling takedown 2. Cystoscopy  Surgeon: Ardis Hughs, MD  Anesthesia: General  Complications: None  Intraoperative findings:   #1: Cystoscopy demonstrated no disruption of the urethra or sling erosion.  There was a tightness at the bladder neck prior to the sling takedown.  EBL: Minimal  Specimens: None  Indication: Misty [name] is a 65 y.o. patient with history of bladder prolapse and stress urinary incontinence.  She underwent a robotic assisted hysterectomy and sacrocolpopexy and concomitant mid urethral sling.  Postoperatively she was unable to void.  She was taught clean intermittent catheterization and ultimately was voiding some on her own but remained obstructed.  After reviewing the management options for treatment, he elected to proceed with the above surgical procedure(s). We have discussed the potential benefits and risks of the procedure, side effects of the proposed treatment, the likelihood of the patient achieving the goals of the procedure, and any potential problems that might occur during the procedure or recuperation. Informed consent has been obtained.  Description of procedure:  The patient was taken to the operating room and general anesthesia was induced.  The patient was placed in the dorsal lithotomy position, prepped and draped in the usual sterile fashion, and preoperative antibiotics were administered. A preoperative time-out was performed.   A 30 degree 21 French cystoscope was gently passed through the patient's urethra and into the bladder under visual guidance.  This demonstrated no mucosal abnormalities.  Her UOs were orthotopic.  Following back down through the urethra there was no disruption of the urethra or abnormality.  It was noted to be tighter at the bladder neck.  We then  placed the [facility] retractor on the drapes and using the hooks retracted the labia majora.  I made a small midline incision that the urethra and dissected down to the mesh.  Once I encountered the mesh I dissected it from the urethra anteriorly and then transected it.  I removed approximately 3 mm of mesh.  The urethra was noted to be significantly looser at this time.  I then repeated cystoscopy demonstrating no disruption of the urethra which also demonstrated a much looser bladder neck.  I subsequently closed the midline incision with a 2-0 Vicryl in a running fashion.  I packed the vagina with a Estrace impregnated 4 x 4 and replaced the Foley catheter.  The patient was subsequently extubated return the PACU stable condition.  Packing will be removed in the PACU, the patient will go home with a Foley catheter and be instructed to remove it on her own tomorrow morning.  Ardis Hughs, M.D.

## 2020-10-01 NOTE — Anesthesia Procedure Notes (Signed)
Procedure Name: LMA Insertion Date/Time: 10/01/2020 9:35 AM Performed by: Bonney Aid, CRNA Pre-anesthesia Checklist: Patient identified, Emergency Drugs available, Suction available and Patient being monitored Patient Re-evaluated:Patient Re-evaluated prior to induction Oxygen Delivery Method: Circle system utilized Preoxygenation: Pre-oxygenation with 100% oxygen Induction Type: IV induction Ventilation: Mask ventilation without difficulty LMA: LMA inserted LMA Size: 4.0 Number of attempts: 1 Airway Equipment and Method: Bite block Placement Confirmation: positive ETCO2 Tube secured with: Tape Dental Injury: Teeth and Oropharynx as per pre-operative assessment

## 2020-10-01 NOTE — Anesthesia Postprocedure Evaluation (Signed)
Anesthesia Post Note  Patient: Misty [name]  Procedure(s) Performed: URETHRAL LYSIS URETHRAL SLING TAKE DOWN with CYSTOSCOPY (N/A Urethra)     Patient location during evaluation: PACU Anesthesia Type: General Level of consciousness: awake and alert and oriented Pain management: pain level controlled Vital Signs Assessment: post-procedure vital signs reviewed and stable Respiratory status: spontaneous breathing, nonlabored ventilation and respiratory function stable Cardiovascular status: blood pressure returned to baseline Postop Assessment: no apparent nausea or vomiting Anesthetic complications: no   No complications documented.  Last Vitals:  Vitals:   10/01/20 1045 10/01/20 1100  BP: 128/81 137/73  Pulse: 80 75  Resp: 20 17  Temp:    SpO2: 100% 100%    Last Pain:  Vitals:   10/01/20 0819  TempSrc: Oral                 Brennan Bailey

## 2020-10-01 NOTE — H&P (Signed)
65 year old female who is 2 and half weeks status post robotic assisted hysterectomy, sacral colpopexy, and mid urethral sling.   The patient has not been able to void since her surgery. She has failed 3 voiding trials today.   The patient is moving her bowels. Her pain is improving. She has no other significant pain.   Interval: Today the patient follows up for re-evaluation prior to her scheduled surgery on Thursday. Three weeks ago I started the patient on clean intermittent catheterization. Since that time she has had no issues with catheterizing herself. She has been voiding some on her own, but continues to strain in struggle.     ALLERGIES: None    MEDICATIONS: Simvastatin 40 mg tablet  Acid Controller  Allerclear  Calcium  Fish Oil  Flonase Allergy Relief  Magnesium  Multiple Vitamin  Proair Hfa 90 mcg hfa aerosol with adapter  Senokot  Vita-C  Vitamin B Complex  Vitamin E     GU PSH: Complex cystometrogram, w/ void pressure and urethral pressure profile studies, any technique - 03/16/2020 Complex Uroflow - 03/16/2020 Cystoscopy - 02/04/2020 Emg surf Electrd - 03/16/2020 Inject For cystogram - 03/16/2020 Intrabd voidng Press - 03/16/2020 Laparoscopy; Surg; Colpopexy - 08/14/2020 Locm 300-399Mg /Ml Iodine,1Ml - 12/17/2019 Sling - 08/14/2020       PSH Notes: "Basal joint surgery for arthritis" in 2013.  Hallux surgery for arthritis in 2003, 2008.  [email or web address]   NON-GU PSH: LSH W/T/O UT 250 G OR LESS - 08/14/2020     GU PMH: Urinary Retention - 09/03/2020, - 09/01/2020, - 08/19/2020 Cystocele, midline - 09/01/2020, - 03/09/2020, - 02/04/2020, - 12/03/2019 Stress Incontinence - 09/01/2020, - 04/13/2020 Female genital prolapse, unspecified - 08/28/2020, - 08/19/2020, - 06/09/2020, - 04/13/2020 Mixed incontinence - 06/09/2020, - 03/16/2020, - 03/09/2020 Urinary Urgency - 03/16/2020, - 03/09/2020, - 02/04/2020, - 12/03/2019 Gross hematuria - 02/04/2020, - 12/03/2019 Renal calculus -  02/04/2020 Renal cyst - 02/04/2020      PMH Notes: pre diabetes   NON-GU PMH: Arthritis Asthma Hypercholesterolemia Sleep Apnea    FAMILY HISTORY: 2 daughters - Daughter 1 son - Son Death - Father Prostate Cancer - Father   SOCIAL HISTORY: Marital Status: Married Preferred Language: English; Ethnicity: Not Hispanic Or Latino; Race: White Current Smoking Status: Patient has never smoked.   Tobacco Use Assessment Completed: Used Tobacco in last 30 days? Has never drank.  Does not use drugs. Drinks 2 caffeinated drinks per day. Has not had a blood transfusion. Patient's occupation is/was Retired.     Notes: ETOH none   REVIEW OF SYSTEMS:    GU Review Female:   Patient denies frequent urination, hard to postpone urination, burning /pain with urination, get up at night to urinate, leakage of urine, stream starts and stops, trouble starting your stream, have to strain to urinate, and being pregnant.  Gastrointestinal (Upper):   Patient denies nausea, vomiting, and indigestion/ heartburn.  Gastrointestinal (Lower):   Patient denies diarrhea and constipation.  Constitutional:   Patient denies fever, night sweats, weight loss, and fatigue.  Skin:   Patient denies skin rash/ lesion and itching.  Eyes:   Patient denies blurred vision and double vision.  Ears/ Nose/ Throat:   Patient denies sore throat and sinus problems.  Hematologic/Lymphatic:   Patient denies easy bruising and swollen glands.  Cardiovascular:   Patient denies leg swelling and chest pains.  Respiratory:   Patient denies cough and shortness of breath.  Endocrine:   Patient denies excessive thirst.  Musculoskeletal:  Patient denies back pain and joint pain.  Neurological:   Patient denies headaches and dizziness.  Psychologic:   Patient denies depression and anxiety.   VITAL SIGNS: None   MULTI-SYSTEM PHYSICAL EXAMINATION:    Constitutional: Well-nourished. No physical deformities. Normally developed. Good grooming.   Respiratory: Normal breath sounds. No labored breathing, no use of accessory muscles.   Cardiovascular: Regular rate and rhythm. No murmur, no gallop. Normal temperature, normal extremity pulses, no swelling, no varicosities.      PAST DATA REVIEW: None   PROCEDURES:          Urinalysis w/Scope Dipstick Dipstick Cont'd Micro  Color: Straw Bilirubin: Neg mg/dL WBC/hpf: 6 - 10/hpf  Appearance: Cloudy Ketones: Neg mg/dL RBC/hpf: 0 - 2/hpf  Specific Gravity: 1.020 Blood: Neg ery/uL Bacteria: Many (>50/hpf)  pH: 7.5 Protein: Neg mg/dL Cystals: NS (Not Seen)  Glucose: Neg mg/dL Urobilinogen: 0.2 mg/dL Casts: NS (Not Seen)    Nitrites: Neg Trichomonas: Not Present    Leukocyte Esterase: 2+ leu/uL Mucous: Not Present      Epithelial Cells: 6 - 10/hpf      Yeast: NS (Not Seen)      Sperm: Not Present    ASSESSMENT:      ICD-10 Details  1 GU:   Urinary Retention - R33.8    PLAN:           Orders Labs Urine Culture          Document Letter(s):  Created for Patient: Clinical Summary         Notes:   The patient is status post sacral colpopexy and mid urethral sling. The sling was too tight, and she still is struggling to void. She has been cathing now for approximately 3 weeks and has made some progress, but continues to have to push and strain. At this point, I recommended that we proceed to the OR for lysis of the sling. I went through the operation with her detail and we discussed potential complications as well as risks and the benefits. The patient is opted to proceed.

## 2020-10-01 NOTE — Transfer of Care (Signed)
Immediate Anesthesia Transfer of Care Note  Patient: Misty [name]  Procedure(s) Performed: URETHRAL LYSIS URETHRAL SLING TAKE DOWN with CYSTOSCOPY (N/A Urethra)  Patient Location: PACU  Anesthesia Type:General  Level of Consciousness: awake, alert  and oriented  Airway & Oxygen Therapy: Patient Spontanous Breathing and Patient connected to nasal cannula oxygen  Post-op Assessment: Report given to RN  Post vital signs: Reviewed and stable  Last Vitals:  Vitals Value Taken Time  BP 137/71 10/01/20 1033  Temp    Pulse 80 10/01/20 1034  Resp 15 10/01/20 1034  SpO2 98 % 10/01/20 1034  Vitals shown include unvalidated device data.  Last Pain:  Vitals:   10/01/20 0819  TempSrc: Oral         Complications: No complications documented.

## 2020-10-01 NOTE — Discharge Instructions (Signed)
Post-op Instructions following urethral sling take down   Removal of catheter You will go home with a catheter or tube in your bladder. You will remove this the day after surgery by cutting the tube that sticks off the end of the catheter (the balloon port with numbers written on it). You can do this in the shower and allow the catheter to fall out.  Ask [place] if you have any questions about the catheter management.  Remove the foley catheter after 24 hours ( day after the procedure).can be done easily by cutting the side port of the catheter, whichallow the balloon to deflate.  You will see 1-2 teaspoons of clear water as the balloon deflates and then the catheter can be slid out without difficulty.        Cut here   Diet:  You may return to your normal diet within 24 hours following your surgery. You may note some mild nausea and possibly vomiting the first 6-8 hours following surgery. This is usually due to the side effects of anesthesia, and will disappear quite soon. I would suggest clear liquids and a very light meal the first evening following your surgery.  Activity:  Your physical activity is to be restricted, especially during the first 2 weeks home. During this time use the following guidelines:   No lifting heavy objects (anything greater than 10 pounds).  No driving a car and limit long car rides.  No strenuous exercise, limits stairclimbing to a minimum.  Do not swim or soak in a bath tub for 2 weeks.  Do not place anything per vagina for 4 weeks.  Wound care: Marland Kitchen There is very little wound care.  The suprapubic incisions (above your pubic bone) are glued closed and this will peel off over the next 7-14 days.  They do not need to be covered.   . The vaginal incision may ooze/bleed a little bit the first couple days after surgery.  This is normal.  It is okay to use a sanitary pad to help keep things clean.  Otherwise this incision needs no additional care.  Bowels:  You  may need a stool softener and. A bowel movement every other day is reasonable. Use a mild laxative if needed, such as milk of magnesia 2-3 tablespoons, or 2 Dulcolax tablets. Call if you continue to have problems. If you had been taking narcotics for pain, before, during or after your surgery, you may be constipated. Take a laxative if necessary.  Medication:  You should resume your pre-surgery medications unless told not to. In addition you may be given an antibiotic to prevent or treat infection. Antibiotics are not always necessary. Pain pills (Tramadol) may also be given to help with the incision and catheter discomfort. Tylenol (acetaminophen) or Advil (ibuprofen) which have no narcotics are better if the pain is not too bad. All medication should be taken as prescribed until the bottles are finished unless you are having an unusual reaction to one of the drugs.  Problems you should report to [place]:  a. Fever greater than 101F. b. Heavy bleeding, or clots (see notes above about blood in urine). c. Inability to urinate. d. Drug reactions (hives, rash, nausea, vomiting, diarrhea). e. Severe burning or pain with urination that is not improving.    Post Anesthesia Home Care Instructions  Activity: Get plenty of rest for the remainder of the day. A responsible individual must stay with you for 24 hours following the procedure.  For the next  24 hours, DO NOT: -Drive a car -Paediatric nurse -Drink alcoholic beverages -Take any medication unless instructed by your physician -Make any legal decisions or sign important papers.  Meals: Start with liquid foods such as gelatin or soup. Progress to regular foods as tolerated. Avoid greasy, spicy, heavy foods. If nausea and/or vomiting occur, drink only clear liquids until the nausea and/or vomiting subsides. Call your physician if vomiting continues.  Special Instructions/Symptoms: Your throat may feel dry or sore from the anesthesia or the  breathing tube placed in your throat during surgery. If this causes discomfort, gargle with warm salt water. The discomfort should disappear within 24 hours.  If you had a scopolamine patch placed behind your ear for the management of post- operative nausea and/or vomiting:  1. The medication in the patch is effective for 72 hours, after which it should be removed.  Wrap patch in a tissue and discard in the trash. Wash hands thoroughly with soap and water. 2. You may remove the patch earlier than 72 hours if you experience unpleasant side effects which may include dry mouth, dizziness or visual disturbances. 3. Avoid touching the patch. Wash your hands with soap and water after contact with the patch.

## 2020-10-02 ENCOUNTER — Encounter (HOSPITAL_BASED_OUTPATIENT_CLINIC_OR_DEPARTMENT_OTHER): Payer: Self-pay | Admitting: Urology

## 2020-10-05 DIAGNOSIS — N819 Female genital prolapse, unspecified: Secondary | ICD-10-CM | POA: Diagnosis not present

## 2020-11-14 ENCOUNTER — Other Ambulatory Visit: Payer: Self-pay | Admitting: Physician Assistant

## 2020-11-23 ENCOUNTER — Other Ambulatory Visit: Payer: Self-pay

## 2020-11-23 ENCOUNTER — Ambulatory Visit (INDEPENDENT_AMBULATORY_CARE_PROVIDER_SITE_OTHER): Payer: Medicare Other | Admitting: Physician Assistant

## 2020-11-23 ENCOUNTER — Ambulatory Visit (INDEPENDENT_AMBULATORY_CARE_PROVIDER_SITE_OTHER): Payer: Medicare Other

## 2020-11-23 ENCOUNTER — Encounter: Payer: Self-pay | Admitting: Physician Assistant

## 2020-11-23 VITALS — BP 157/89 | HR 80 | Temp 98.1°F | Resp 20 | Ht 64.0 in | Wt 160.0 lb

## 2020-11-23 DIAGNOSIS — M79671 Pain in right foot: Secondary | ICD-10-CM | POA: Diagnosis not present
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
## 2020-11-24 ENCOUNTER — Ambulatory Visit (INDEPENDENT_AMBULATORY_CARE_PROVIDER_SITE_OTHER): Payer: Medicare Other | Admitting: Sports Medicine

## 2020-11-24 ENCOUNTER — Ambulatory Visit (INDEPENDENT_AMBULATORY_CARE_PROVIDER_SITE_OTHER): Payer: Medicare Other

## 2020-11-24 DIAGNOSIS — M65321 Trigger finger, right index finger: Secondary | ICD-10-CM

## 2020-11-24 DIAGNOSIS — S9031XA Contusion of right foot, initial encounter: Secondary | ICD-10-CM

## 2020-11-24 DIAGNOSIS — M19071 Primary osteoarthritis, right ankle and foot: Secondary | ICD-10-CM | POA: Insufficient documentation

## 2020-11-24 DIAGNOSIS — G8929 Other chronic pain: Secondary | ICD-10-CM | POA: Insufficient documentation

## 2020-11-24 NOTE — Assessment & Plan Note (Signed)
This is a very pleasant 65 year old female, she is noted increasing pain, fullness, and triggering in her right second and third digits. She would like aggressive treatment today so we are doing right second and third flexor tendon sheath injections, home rehab exercises given, return to see me in 1 month.
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
## 2020-11-24 NOTE — Progress Notes (Signed)
No evidence of fractures. There is a peroneal tendon that attaches in the area of pain. Please address with Dr. Darene Lamer when you see him today.

## 2020-12-03 ENCOUNTER — Other Ambulatory Visit: Payer: Self-pay | Admitting: Physician Assistant

## 2020-12-18 ENCOUNTER — Other Ambulatory Visit: Payer: Self-pay | Admitting: Physician Assistant

## 2020-12-21 ENCOUNTER — Ambulatory Visit (INDEPENDENT_AMBULATORY_CARE_PROVIDER_SITE_OTHER): Payer: Medicare Other | Admitting: Physician Assistant

## 2020-12-21 ENCOUNTER — Other Ambulatory Visit: Payer: Self-pay

## 2020-12-21 VITALS — BP 151/74 | HR 88 | Ht 64.0 in | Wt 160.0 lb

## 2020-12-21 DIAGNOSIS — M19042 Primary osteoarthritis, left hand: Secondary | ICD-10-CM | POA: Diagnosis not present

## 2020-12-21 DIAGNOSIS — M79642 Pain in left hand: Secondary | ICD-10-CM | POA: Diagnosis not present

## 2020-12-21 DIAGNOSIS — I10 Essential (primary) hypertension: Secondary | ICD-10-CM

## 2020-12-21 DIAGNOSIS — M19041 Primary osteoarthritis, right hand: Secondary | ICD-10-CM | POA: Diagnosis not present

## 2020-12-21 DIAGNOSIS — M79641 Pain in right hand: Secondary | ICD-10-CM | POA: Diagnosis not present

## 2020-12-21 DIAGNOSIS — E538 Deficiency of other specified B group vitamins: Secondary | ICD-10-CM

## 2020-12-21 DIAGNOSIS — E559 Vitamin D deficiency, unspecified: Secondary | ICD-10-CM | POA: Diagnosis not present

## 2020-12-21 DIAGNOSIS — D649 Anemia, unspecified: Secondary | ICD-10-CM | POA: Diagnosis not present

## 2020-12-21 DIAGNOSIS — L603 Nail dystrophy: Secondary | ICD-10-CM

## 2020-12-21 DIAGNOSIS — R5383 Other fatigue: Secondary | ICD-10-CM | POA: Diagnosis not present

## 2020-12-21 MED ORDER — DICLOFENAC SODIUM 1 % EX GEL: 1.0000 "application " | Freq: Four times a day (QID) | CUTANEOUS | 2 refills | Status: AC | PRN

## 2020-12-21 NOTE — Patient Instructions (Addendum)
Increase iron rich foods in diet.  Get labs.  Start biotin for nail growth '5000mg'$  a day.

## 2020-12-21 NOTE — Progress Notes (Signed)
Subjective:    Patient ID: Misty [name], female    DOB: Apr 19, 1956, 65 y.o.   MRN: [id number]  HPI Pt is a 65 yo deaf female with hx of elevated blood pressure, OSA, pre-diabetes who presents to the clinic for 66 month follow up on BP.  She is deaf and sign language interpretor present. She is NOT taking any BP medication. She denies any palpitations, headaches or vision changes. She did have one episode of Chest tightness when she was really stressed about something but not with exertion. She is checking BP at home and ranging 120s to 130s most of the time.   She has noticed her nails being brittle since hysterectomy and urinary complications in spring. She wonders if something needs to be checked. Her hemoglobin was 11.3 at last check.    Pt needs diclofenac refilled for hand OA.   .. 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   Essential hypertension, benign 12/16/2013   Hyperlipidemia 12/16/2013   Frequent headaches 12/16/2013   Hx of extrinsic asthma 12/16/2013   Pre-diabetes 12/27/2013   Polyp, cervix 09/09/2015   Midline low back pain without sciatica 09/09/2015   Trigger finger, right 05/17/2016   Gluteal tendonitis 07/25/2016   Screening for breast cancer 07/25/2016   Fatigue 12/05/2016   Osteopenia determined by x-ray 12/07/2016   OSA (obstructive sleep apnea) 01/02/2017   Dyspnea 04/13/2017   GERD (gastroesophageal reflux disease) 04/13/2017   Vaginal dryness, menopausal 01/31/2018   Female cystocele 02/12/2019   Constipation 02/12/2019   Female genital prolapse, unspecified 04/15/2020   Stress incontinence 04/15/2020   Primary localized osteoarthrosis of hands, bilateral 07/15/2020   Bilateral hand pain 07/21/2020   Chronic rhinitis 07/21/2020   Reactive airway disease 07/21/2020   Cystocele with prolapse 08/14/2020   Hypokalemia 09/01/2020   Urinary retention 09/01/2020   Status post hysterectomy 09/01/2020    Elevated blood pressure reading without diagnosis of hypertension 09/01/2020   Chest tightness 09/23/2020   Contusion of foot, right 11/24/2020   B12 deficiency 12/22/2020   Vitamin D deficiency 12/22/2020   Brittle nails 12/22/2020   Resolved Ambulatory Problems    Diagnosis Date Noted   Finger pain, right 05/17/2016   Past Medical History:  Diagnosis Date   Allergic rhinitis, seasonal    Anxiety    Complication of anesthesia    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     Objective:   Physical Exam Vitals reviewed.  Constitutional:      Appearance: Normal appearance.  HENT:     Head: Normocephalic.  Cardiovascular:     Rate and Rhythm: Normal rate and regular rhythm.     Pulses: Normal pulses.     Heart sounds: Normal heart sounds.  Pulmonary:     Effort: Pulmonary effort is normal.     Breath sounds: Normal breath sounds.  Abdominal:     Tenderness: There is no right CVA tenderness or left CVA tenderness.  Musculoskeletal:     Right lower leg: No edema.     Left lower leg: No edema.  Skin:    Comments: No pitting or thickening. Nail tips are weak and brittle.   Neurological:     General: No focal deficit present.     Mental Status: She is alert and oriented to person, place, and time.  Psychiatric:        Mood and Affect: Mood normal.       Assessment & Plan:  Marland KitchenMarland KitchenPaytn was seen today for follow-up.  Diagnoses and all orders for this visit:  Essential hypertension, benign  Bilateral hand pain -     diclofenac Sodium (VOLTAREN) 1 % GEL; Apply 1 application topically 4 (four) times daily as needed (pain).  Brittle nails -     CBC w/Diff/Platelet -     VITAMIN D 25 Hydroxy  (Vit-D Deficiency, Fractures) -     Fe+TIBC+Fer -     B12 and Folate Panel -     COMPLETE METABOLIC PANEL WITH GFR  123456 deficiency -     B12 and Folate Panel  Vitamin D deficiency -     VITAMIN D 25 Hydroxy (Vit-D Deficiency, Fractures)  BP reading in office not to goal but home readings are good. Ok to keep monitoring at home and stay on BP medications. Keep low salt diet and be active.   Will check labs for brittle nails. Likely more due to stress of surgery and complications. Increase iron rich foods. Hemoglobin was down in the spring.  Start biotin and increase nutrition.  Follow up as needed.   Diclofenac gel refilled for hand osteoarthritis.   Follow up in 6 months.      Spent 30 minutes with patient and interpreter discussing causes, labs, plans and blood pressure control.

## 2020-12-22 ENCOUNTER — Ambulatory Visit (INDEPENDENT_AMBULATORY_CARE_PROVIDER_SITE_OTHER): Payer: Medicare Other

## 2020-12-22 ENCOUNTER — Encounter: Payer: Self-pay | Admitting: Physician Assistant

## 2020-12-22 ENCOUNTER — Ambulatory Visit (INDEPENDENT_AMBULATORY_CARE_PROVIDER_SITE_OTHER): Payer: Medicare Other | Admitting: Sports Medicine

## 2020-12-22 DIAGNOSIS — M19041 Primary osteoarthritis, right hand: Secondary | ICD-10-CM

## 2020-12-22 DIAGNOSIS — E538 Deficiency of other specified B group vitamins: Secondary | ICD-10-CM | POA: Insufficient documentation

## 2020-12-22 DIAGNOSIS — M19042 Primary osteoarthritis, left hand: Secondary | ICD-10-CM | POA: Diagnosis not present

## 2020-12-22 DIAGNOSIS — E559 Vitamin D deficiency, unspecified: Secondary | ICD-10-CM | POA: Insufficient documentation

## 2020-12-22 DIAGNOSIS — M65321 Trigger finger, right index finger: Secondary | ICD-10-CM

## 2020-12-22 DIAGNOSIS — L603 Nail dystrophy: Secondary | ICD-10-CM | POA: Insufficient documentation

## 2020-12-22 LAB — COMPLETE METABOLIC PANEL WITH GFR
AG Ratio: 2.1 (calc) (ref 1.0–2.5)
ALT: 31 U/L — ABNORMAL HIGH (ref 6–29)
AST: 21 U/L (ref 10–35)
Albumin: 4.8 g/dL (ref 3.6–5.1)
Alkaline phosphatase (APISO): 82 U/L (ref 37–153)
BUN: 15 mg/dL (ref 7–25)
CO2: 29 mmol/L (ref 20–32)
Calcium: 9.8 mg/dL (ref 8.6–10.4)
Chloride: 107 mmol/L (ref 98–110)
Creat: 0.65 mg/dL (ref 0.50–1.05)
Globulin: 2.3 g/dL (calc) (ref 1.9–3.7)
Glucose, Bld: 97 mg/dL (ref 65–99)
Potassium: 4 mmol/L (ref 3.5–5.3)
Sodium: 143 mmol/L (ref 135–146)
Total Bilirubin: 0.5 mg/dL (ref 0.2–1.2)
Total Protein: 7.1 g/dL (ref 6.1–8.1)
eGFR: 97 mL/min/{1.73_m2} (ref >=60)

## 2020-12-22 LAB — CBC WITH DIFFERENTIAL/PLATELET
Absolute Monocytes: 382 cells/uL (ref 200–950)
Basophils Absolute: 29 cells/uL (ref 0–200)
Basophils Relative: 0.6 %
Eosinophils Absolute: 103 cells/uL (ref 15–500)
Eosinophils Relative: 2.1 %
HCT: 42.6 % (ref 35.0–45.0)
Hemoglobin: 13.9 g/dL (ref 11.7–15.5)
Lymphs Abs: 1450 cells/uL (ref 850–3900)
MCH: 28.4 pg (ref 27.0–33.0)
MCHC: 32.6 g/dL (ref 32.0–36.0)
MCV: 87.1 fL (ref 80.0–100.0)
MPV: 11.1 fL (ref 7.5–12.5)
Monocytes Relative: 7.8 %
Neutro Abs: 2935 cells/uL (ref 1500–7800)
Neutrophils Relative %: 59.9 %
Platelets: 194 10*3/uL (ref 140–400)
RBC: 4.89 10*6/uL (ref 3.80–5.10)
RDW: 13.1 % (ref 11.0–15.0)
Total Lymphocyte: 29.6 %
WBC: 4.9 10*3/uL (ref 3.8–10.8)

## 2020-12-22 LAB — IRON,TIBC AND FERRITIN PANEL
%SAT: 24 % (calc) (ref 16–45)
Ferritin: 195 ng/mL (ref 16–288)
Iron: 81 ug/dL (ref 45–160)
TIBC: 338 mcg/dL (calc) (ref 250–450)

## 2020-12-22 LAB — B12 AND FOLATE PANEL
Folate: >24 ng/mL
Vitamin B-12: 590 pg/mL (ref 200–1100)

## 2020-12-22 LAB — VITAMIN D 25 HYDROXY (VIT D DEFICIENCY, FRACTURES): Vit D, 25-Hydroxy: 27 ng/mL — ABNORMAL LOW (ref 30–100)

## 2020-12-22 NOTE — Assessment & Plan Note (Signed)
Misty [name] has done very well with trigger finger injections, she is still having some pain, swelling in her right second proximal interphalangeal joint. Per her request we injected the right second PIP today, she may need another injection in October. Return in a month to make sure things are going well.

## 2020-12-22 NOTE — Assessment & Plan Note (Signed)
At the last visit I performed flexor tendon sheath injections in the right second and third digits, she is doing a lot better from this perspective.

## 2020-12-22 NOTE — Progress Notes (Signed)
Mae,   Calcium looks good.  Kidney function looks much better.  B12 is perfect stay on same supplementation.  Iron looks pretty good. Better than spring. Ok to increase iron rich foods but you do not need supplement.  One liver enzyme just a little elevated. Will monitor, recheck in 3 months.

## 2020-12-22 NOTE — Progress Notes (Signed)
    Procedures performed today:    Procedure: Real-time Ultrasound Guided injection of the right second proximal interphalangeal joint Device: Samsung HS60  Verbal informed consent obtained.  Time-out conducted.  Noted no overlying erythema, induration, or other signs of local infection.  Skin prepped in a sterile fashion.  Local anesthesia: Topical Ethyl chloride.  With sterile technique and under real time ultrasound guidance: Noted arthritic joint, 1/2 cc kenalog 40, 1/2 cc lidocaine injected easily.   Completed without difficulty  Advised to call if fevers/chills, erythema, induration, drainage, or persistent bleeding.  Images permanently stored and available for review in PACS.  Impression: Technically successful ultrasound guided injection.  Independent interpretation of notes and tests performed by another provider:   None.  Brief History, Exam, Impression, and Recommendations:    Primary localized osteoarthrosis of hands, bilateral Misty [name] has done very well with trigger finger injections, she is still having some pain, swelling in her right second proximal interphalangeal joint. Per her request we injected the right second PIP today, she may need another injection in October. Return in a month to make sure things are going well.  Trigger finger, right At the last visit I performed flexor tendon sheath injections in the right second and third digits, she is doing a lot better from this perspective.    ___________________________________________ Gwen Her. Dianah Field, M.D., ABFM., CAQSM. Primary Care and [place] Instructor of [place] of [facility] of Medicine

## 2021-01-05 DIAGNOSIS — N393 Stress incontinence (female) (male): Secondary | ICD-10-CM | POA: Diagnosis not present

## 2021-01-05 DIAGNOSIS — N8111 Cystocele, midline: Secondary | ICD-10-CM | POA: Diagnosis not present

## 2021-01-19 ENCOUNTER — Ambulatory Visit (INDEPENDENT_AMBULATORY_CARE_PROVIDER_SITE_OTHER): Payer: Medicare Other | Admitting: Sports Medicine

## 2021-01-19 ENCOUNTER — Other Ambulatory Visit: Payer: Self-pay

## 2021-01-19 DIAGNOSIS — M79671 Pain in right foot: Secondary | ICD-10-CM

## 2021-01-19 DIAGNOSIS — G8929 Other chronic pain: Secondary | ICD-10-CM | POA: Diagnosis not present
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
## 2021-01-24 ENCOUNTER — Other Ambulatory Visit: Payer: Self-pay

## 2021-01-24 ENCOUNTER — Ambulatory Visit (INDEPENDENT_AMBULATORY_CARE_PROVIDER_SITE_OTHER): Payer: Medicare Other

## 2021-01-24 DIAGNOSIS — M19071 Primary osteoarthritis, right ankle and foot: Secondary | ICD-10-CM | POA: Diagnosis not present

## 2021-01-24 DIAGNOSIS — G8929 Other chronic pain: Secondary | ICD-10-CM

## 2021-01-24 DIAGNOSIS — M79671 Pain in right foot: Secondary | ICD-10-CM

## 2021-02-08 ENCOUNTER — Telehealth: Payer: Self-pay

## 2021-02-08 NOTE — Telephone Encounter (Signed)
Medication: diclofenac Sodium (VOLTAREN) 1 % GEL Prior authorization submitted via CoverMyMeds on 02/08/2021 [place] submission pending

## 2021-02-10 ENCOUNTER — Telehealth: Payer: Self-pay | Admitting: Physician Assistant

## 2021-02-10 NOTE — Telephone Encounter (Signed)
Left message for patient to call back and schedule Medicare Annual Wellness Visit (AWV) either virtually or in office. I gave my number [phone]   Last AWV ;01/31/18  please schedule at anytime with health coach

## 2021-02-16 ENCOUNTER — Other Ambulatory Visit: Payer: Self-pay

## 2021-02-16 ENCOUNTER — Ambulatory Visit (INDEPENDENT_AMBULATORY_CARE_PROVIDER_SITE_OTHER): Payer: Medicare Other | Admitting: Sports Medicine

## 2021-02-16 ENCOUNTER — Ambulatory Visit (INDEPENDENT_AMBULATORY_CARE_PROVIDER_SITE_OTHER): Payer: Medicare Other

## 2021-02-16 DIAGNOSIS — G8929 Other chronic pain: Secondary | ICD-10-CM

## 2021-02-16 DIAGNOSIS — Z23 Encounter for immunization: Secondary | ICD-10-CM

## 2021-02-16 DIAGNOSIS — M79671 Pain in right foot: Secondary | ICD-10-CM

## 2021-02-16 NOTE — Progress Notes (Signed)
    Procedures performed today:    Procedure: Real-time Ultrasound Guided injection of the right fourth metatarsocuboid joint. Device: Samsung HS60  Verbal informed consent obtained.  Time-out conducted.  Noted no overlying erythema, induration, or other signs of local infection.  Skin prepped in a sterile fashion.  Local anesthesia: Topical Ethyl chloride.  With sterile technique and under real time ultrasound guidance: Noted arthritic joint, 1 cc Kenalog 40, 1 cc lidocaine, 1 cc bupivacaine injected easily Completed without difficulty  Advised to call if fevers/chills, erythema, induration, drainage, or persistent bleeding.  Images permanently stored and available for review in PACS.  Impression: Technically successful ultrasound guided injection.  Independent interpretation of notes and tests performed by another provider:   I personally reviewed the MRI, there are some arthritic changes including subchondral cysts at the metatarsal/cuboid joint.  Brief History, Exam, Impression, and Recommendations:    Chronic foot pain, right Misty [name] returns, she is a pleasant 65 year old female with chronic right foot pain, we initially suspected stress injury, after failure of mobilization, partial weightbearing, we proceeded with an MRI that ultimately showed intertarsal osteoarthritis, today we injected her fourth metatarsal/cuboid joint, return to see me in a month.    ___________________________________________ Gwen Her. Dianah Field, M.D., ABFM., CAQSM. Primary Care and [place] Instructor of [place] of [facility] of Medicine

## 2021-02-16 NOTE — Assessment & Plan Note (Signed)
Misty [name] returns, she is a pleasant 65 year old female with chronic right foot pain, we initially suspected stress injury, after failure of mobilization, partial weightbearing, we proceeded with an MRI that ultimately showed intertarsal osteoarthritis, today we injected her fourth metatarsal/cuboid joint, return to see me in a month.

## 2021-03-05 NOTE — Telephone Encounter (Signed)
Medication:  diclofenac Sodium (VOLTAREN) 1 % GEL Prior authorization determination received Medication has been approved Approval dates: 05/30/2020-05/29/2021  Patient aware via: [place] aware: Yes Provider aware via this encounter

## 2021-03-11 DIAGNOSIS — Z23 Encounter for immunization: Secondary | ICD-10-CM | POA: Diagnosis not present

## 2021-03-16 ENCOUNTER — Other Ambulatory Visit: Payer: Self-pay

## 2021-03-16 ENCOUNTER — Ambulatory Visit (INDEPENDENT_AMBULATORY_CARE_PROVIDER_SITE_OTHER): Payer: Medicare Other | Admitting: Sports Medicine

## 2021-03-16 DIAGNOSIS — M19071 Primary osteoarthritis, right ankle and foot: Secondary | ICD-10-CM

## 2021-03-16 NOTE — Assessment & Plan Note (Signed)
This is a pleasant 65 year old female, we have been treating her for chronic right foot pain, initially suspected to be a stress injury, after failure of immobilization and partial weightbearing we proceeded with an MRI that ultimately showed intertarsal osteoarthritis, the most painful joint was her fourth metatarsal/cuboid joint, this was injected at the last visit. She returns today doing significantly better, she does have a bit of discomfort when ambulating, I have advised her to get some custom molded orthotics, referral placed, she will also ice her feet for 20 minutes after long walks, return to see me as needed.

## 2021-03-16 NOTE — Progress Notes (Signed)
    Procedures performed today:    None.  Independent interpretation of notes and tests performed by another provider:   None.  Brief History, Exam, Impression, and Recommendations:    Primary osteoarthritis of right foot This is a pleasant 65 year old female, we have been treating her for chronic right foot pain, initially suspected to be a stress injury, after failure of immobilization and partial weightbearing we proceeded with an MRI that ultimately showed intertarsal osteoarthritis, the most painful joint was her fourth metatarsal/cuboid joint, this was injected at the last visit. She returns today doing significantly better, she does have a bit of discomfort when ambulating, I have advised her to get some custom molded orthotics, referral placed, she will also ice her feet for 20 minutes after long walks, return to see me as needed.    ___________________________________________ Gwen Her. Dianah Field, M.D., ABFM., CAQSM. Primary Care and [place] Instructor of [place] of [facility] of Medicine

## 2021-04-07 DIAGNOSIS — U071 COVID-19: Secondary | ICD-10-CM | POA: Diagnosis not present

## 2021-04-14 ENCOUNTER — Encounter: Payer: Self-pay | Admitting: Physician Assistant

## 2021-05-17 ENCOUNTER — Other Ambulatory Visit: Payer: Self-pay

## 2021-05-17 ENCOUNTER — Ambulatory Visit (INDEPENDENT_AMBULATORY_CARE_PROVIDER_SITE_OTHER): Payer: Medicare Other | Admitting: Physician Assistant

## 2021-05-17 VITALS — BP 139/65 | HR 82 | Ht 64.0 in | Wt 159.0 lb

## 2021-05-17 DIAGNOSIS — Z1211 Encounter for screening for malignant neoplasm of colon: Secondary | ICD-10-CM

## 2021-05-17 DIAGNOSIS — Z0000 Encounter for general adult medical examination without abnormal findings: Secondary | ICD-10-CM

## 2021-05-17 NOTE — Progress Notes (Signed)
MEDICARE ANNUAL WELLNESS VISIT  05/17/2021  Subjective:  Misty [name] is a 65 y.o. female patient of Alden Hipp, Royetta Car, PA-C who had a [employer] Visit today. Misty [name] is Retired and lives with their family. she has 3 children. she reports that she is socially active and does interact with friends/family regularly. she is minimally physically active and enjoys sewing, jigsaw puzzles and is the primary caretaker of her husband..  Patient Care Team: Lavada Mesi as PCP - General (Family Medicine)  Advanced Directives 05/17/2021 10/01/2020 08/19/2020 08/15/2020 08/14/2020 08/06/2020 06/16/2020  Does Patient Have a Medical Advance Directive? Yes Yes Yes Yes Yes Yes Yes  Type of Advance Directive Living will;Healthcare Power of [place];Living will - Living will;Healthcare Power of Attorney Living will;Healthcare Power of Attorney Living will;Healthcare Power of Attorney Living will;Healthcare Power of Attorney  Does patient want to make changes to medical advance directive? No - Patient declined No - Patient declined - No - Patient declined - - -  Copy of [place] in Chart? No - copy requested No - copy requested - No - copy requested No - copy requested - [facility] Utilization Over the Past 12 Months: # of hospitalizations or ER visits: 1 # of surgeries: 2  Review of Systems    Patient reports that her overall health is better when compared to last year.  Review of Systems: History obtained from chart review and the patient  All other systems negative.  Pain Assessment Pain : 0-10 Pain Score: 4  Pain Type: Chronic pain Pain Location: Foot Pain Orientation: Right Pain Onset: More than a month ago Pain Frequency: Intermittent Pain Relieving Factors: massage and voltaren gel  Pain Relieving Factors: massage and voltaren gel  Current Medications & Allergies (verified) Allergies as of 05/17/2021       Reactions    Monosodium Glutamate    headache        Medication List        Accurate as of May 17, 2021  9:43 AM. If you have any questions, ask your nurse or doctor.          acetaminophen 500 MG tablet Commonly known as: TYLENOL Take 1,000 mg by mouth every 6 (six) hours as needed for moderate pain or headache.   albuterol 108 (90 Base) MCG/ACT inhaler Commonly known as: ProAir HFA TAKE 2 PUFFS BY MOUTH EVERY 6 HOURS AS NEEDED FOR WHEEZE OR SHORTNESS OF BREATH What changed:  how much to take how to take this when to take this reasons to take this   AMBULATORY NON FORMULARY MEDICATION Continuous positive airway pressure (CPAP) machine auto-titrate from 4-20 cm of H2O pressure, with all supplemental supplies as needed.   Biotin 10 MG Caps Take by mouth.   calcium carbonate 500 MG chewable tablet Commonly known as: TUMS - dosed in mg elemental calcium Chew 1,000-1,500 mg by mouth daily as needed for indigestion or heartburn.   diclofenac Sodium 1 % Gel Commonly known as: VOLTAREN Apply 1 application topically 4 (four) times daily as needed (pain).   estradiol 0.1 MG/GM vaginal cream Commonly known as: ESTRACE Place 1 Applicatorful vaginally 3 (three) times a week. Best to not use the applicator, but instead to use 1 pea-sized dolyp of cream and gently swap the inside of the vagina every other day.   famotidine 40 MG tablet Commonly known as: PEPCID Take 40 mg by mouth daily.   fluticasone 50  MCG/ACT nasal spray Commonly known as: FLONASE Place 2 sprays into both nostrils daily as needed for allergies.   GAS-X PO Take 2 tablets by mouth daily as needed (gas).   loratadine 10 MG tablet Commonly known as: CLARITIN Take 10 mg by mouth daily.   magnesium oxide 400 MG tablet Commonly known as: MAG-OX Take 400 mg by mouth at bedtime.   MIRALAX PO Take by mouth as needed.   PROBIOTIC PO Take 1 capsule by mouth daily.   sennosides-docusate sodium 8.6-50 MG  tablet Commonly known as: SENOKOT-S Take 2 tablets by mouth every 14 (fourteen) days.   simvastatin 40 MG tablet Commonly known as: ZOCOR Take 1 tablet (40 mg total) by mouth daily at 6 PM.   traMADol 50 MG tablet Commonly known as: Ultram Take 1-2 tablets (50-100 mg total) by mouth every 6 (six) hours as needed for moderate pain.   TURMERIC PO Take 1 capsule by mouth 2 (two) times daily.        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    per pt at age 55 months red measles with high fever;   needs american sign language interperter   Headache    History of kidney stones    Hyperlipidemia    Mild intermittent asthma    followed by pcp  (previously seen by dr [place] Bells, pulmonology)   OA (osteoarthritis)    OSA on CPAP    per pt uses nightly,  moderate osa per study in epic 12-27-2016   Osteopenia determined by x-ray 12/07/2016   DEXA scan July 2018   PONV (postoperative nausea and vomiting)    Pre-diabetes    Self-catheterizes urinary bladder    psot op sling procedure on 08-14-2020   Sleep apnea    Urinary retention    post op sling procedure on 08-14-2020, self cathing   Wears glasses    Past Surgical History:  Procedure Laterality Date   ABDOMINAL HYSTERECTOMY  08/14/2020   bilateral foot surgery      left hand surgery     for arthritis    PUBOVAGINAL SLING N/A 08/14/2020   Procedure: Gaynelle Arabian;  Surgeon: Ardis Hughs, MD;  Location: [facility];  Service: Urology;  Laterality: N/A;   PUBOVAGINAL SLING N/A 10/01/2020   Procedure: URETHRAL LYSIS URETHRAL SLING TAKE DOWN with CYSTOSCOPY;  Surgeon: Ardis Hughs, MD;  Location: [facility];  Service: Urology;  Laterality: N/A;   ROBOTIC ASSISTED LAPAROSCOPIC HYSTERECTOMY AND SALPINGECTOMY Bilateral 08/14/2020   Procedure: XI ROBOTIC  SUPRACERVOCAL HYSTERECTOMY AND BILATERAL SALPINGO OOPHORECTOMY;  Surgeon: Ardis Hughs, MD;  Location: [facility];  Service: Urology;  Laterality: Bilateral;   ROBOTIC ASSISTED LAPAROSCOPIC SACROCOLPOPEXY N/A 08/14/2020   Procedure: XI ROBOTIC  SACROCOLPOPEXY WITH CYSTOSCOPY;  Surgeon: Ardis Hughs, MD;  Location: [facility];  Service: Urology;  Laterality: N/A;   Family History  Problem Relation Age of Onset   Hyperlipidemia Mother    Cancer Father        prostate   Diabetes Father    Arthritis Father    Cancer Maternal Grandfather        colon cancer   Stroke Maternal Grandfather    Breast cancer Sister    Cancer Maternal Grandmother    Cancer Paternal Grandfather  Social History   Socioeconomic History   Marital status: Married    Spouse name: Euretha Najarro   Number of children: 3   Years of education: 14   Highest education level: Some college, no degree  Occupational History   Occupation: Retired.  Tobacco Use   Smoking status: Never   Smokeless tobacco: Never  Vaping Use   Vaping Use: Never used  Substance and Sexual Activity   Alcohol use: Never   Drug use: Never   Sexual activity: Not Currently    Birth control/protection: Post-menopausal  Other Topics Concern   Not on file  Social History Narrative   Lives with her husband, son, daughter-in-law and grandson. She has three children. She enjoys sewing and digital puzzles.   Social Determinants of Health   Financial Resource Strain: Low Risk    Difficulty of Paying Living Expenses: Not hard at all  Food Insecurity: No Food Insecurity   Worried About Charity fundraiser in the Last Year: Never true   [place] in the Last Year: Never true  Transportation Needs: No Transportation Needs   Lack of Transportation (Medical): No   Lack of Transportation (Non-Medical): No  Physical Activity: Inactive   Days of Exercise per Week: 0 days   Minutes of Exercise per Session: 0 min  Stress: Stress  Concern Present   Feeling of Stress : To some extent  Social Connections: Moderately Integrated   Frequency of Communication with Friends and Family: Twice a week   Frequency of Social Gatherings with Friends and Family: More than three times a week   Attends Religious Services: Never   Marine scientist or Organizations: Yes   Attends Music therapist: More than 4 times per year   Marital Status: Married    Activities of Daily Living In your present state of health, do you have any difficulty performing the following activities: 05/17/2021 05/14/2021  Hearing? Tempie Donning  Vision? N N  Difficulty concentrating or making decisions? N N  Walking or climbing stairs? N N  Dressing or bathing? N N  Doing errands, shopping? N N  Preparing Food and eating ? N N  Using the Toilet? N N  In the past six months, have you accidently leaked urine? N N  Do you have problems with loss of bowel control? N N  Managing your Medications? N N  Managing your Finances? N N  Housekeeping or managing your Housekeeping? N N  Some recent data might be hidden    Patient Education/Literacy How often do you need to have someone help you when you read instructions, pamphlets, or other written materials from your doctor or pharmacy?: 3 - Sometimes What is the last grade level you completed in school?: 12th grade and some college  Exercise Current Exercise Habits: The patient does not participate in regular exercise at present, Exercise limited by: orthopedic condition(s)  Diet Patient reports consuming 3 meals a day and 1 snack(s) a day Patient reports that her primary diet is: Regular Patient reports that she does have regular access to food.   Depression Screen PHQ 2/9 Scores 05/17/2021 12/21/2020 07/13/2020 02/11/2019 01/31/2018 01/10/2018 12/05/2016  PHQ - 2 Score 0 0 2 4 2  0 0  PHQ- 9 Score - - 6 6 3  - -     Fall Risk Fall Risk  05/17/2021 05/14/2021 12/21/2020 07/13/2020 01/10/2018  Falls in  the past year? 0 0 0 0 No  Number falls in past  yr: 0 - 0 0 -  Injury with Fall? 0 - 0 0 -  Risk for fall due to : No Fall Risks - No Fall Risks - -  Follow up Falls evaluation completed - Falls evaluation completed Falls evaluation completed -     Objective:   BP 139/65 (BP Location: Left Arm, Patient Position: Sitting, Cuff Size: Normal)    Pulse 82    Ht 5\' 4"  (1.626 m)    Wt 159 lb 0.6 oz (72.1 kg)    SpO2 97%    BMI 27.30 kg/m   Last Weight  Most recent update: 05/17/2021  9:03 AM    Weight  72.1 kg (159 lb 0.6 oz)             Body mass index is 27.3 kg/m.  Hearing/Vision  Vicki did  have difficulty with hearing/understanding during the face-to-face interview. She is deaf.  Leani did not have difficulty with her vision during the face-to-face interview Reports that she has not had a formal eye exam by an eye care professional within the past year Reports that she has not had a formal hearing evaluation within the past year  Cognitive Function: 6CIT Screen 05/17/2021  What Year? 0 points  What month? 0 points  What time? 0 points  Count back from 20 0 points  Months in reverse 0 points  Repeat phrase 0 points  Total Score 0    Normal Cognitive Function Screening: Yes (Normal:0-7, Significant for Dysfunction: >8)  Immunization & Health Maintenance Record Immunization History  Administered Date(s) Administered   Fluad Quad(high Dose 65+) 03/23/2020, 02/16/2021   Influenza,inj,Quad PF,6+ Mos 03/06/2014, 03/02/2017, 01/31/2018, 01/24/2019   Influenza-Unspecified 04/28/2016   PFIZER(Purple Top)SARS-COV-2 Vaccination 08/09/2019, 08/30/2019, 04/09/2020   Pfizer Covid-19 Vaccine Bivalent Booster 90yrs & up 03/11/2021   Pneumococcal Conjugate-13 07/13/2020   Tdap 07/08/2010   Zoster, Live 04/18/2011    Health Maintenance  Topic Date Due   Zoster Vaccines- Shingrix (1 of 2) 08/15/2021 (Originally 11/25/2004)   TETANUS/TDAP  09/21/2021 (Originally 07/08/2020)    Pneumonia Vaccine 37+ Years old (2 - PPSV23 if available, else PCV20) 05/17/2022 (Originally 07/13/2021)   COLONOSCOPY (Pts 45-31yrs Insurance coverage will need to be confirmed)  05/17/2022 (Originally 04/29/2021)   MAMMOGRAM  08/06/2022   INFLUENZA VACCINE  Completed   DEXA SCAN  Completed   COVID-19 Vaccine  Completed   Hepatitis C Screening  Completed   HPV VACCINES  Aged Out       Assessment  This is a routine wellness examination for [employer].  Health Maintenance: Due or Overdue There are no preventive care reminders to display for this patient.   [name] does not need a referral for Community Assistance: Care Management:   no Social Work:    no Prescription Assistance:  no Nutrition/Diabetes Education:  no   Plan:  Personalized Goals  Goals Addressed               This Visit's Progress     Patient Stated (pt-stated)        Would like to loose some weight.        Personalized Health Maintenance & Screening Recommendations  Td vaccine Colorectal cancer screening Shingrix vaccine  Lung Cancer Screening Recommended: no (Low Dose CT Chest recommended if Age 58-80 years, 30 pack-year currently smoking OR have quit w/in past 15 years) Hepatitis C Screening recommended: no HIV Screening recommended: no  Advanced Directives: Written information was not given per the patient's  request.  Referrals & Orders Orders Placed This Encounter  Procedures   Ambulatory referral to Gastroenterology (for Colonoscopy)    Follow-up Plan Follow-up with Donella Stade, PA-C as planned Schedule your appointment at the pharmacy for your tetanus and shingrix vaccine.  Medicare wellness visit in one year.  AVS printed and given to the patient.   I have personally reviewed and noted the following in the patients chart:   Medical and social history Use of alcohol, tobacco or illicit drugs  Current medications and supplements Functional ability and  status Nutritional status Physical activity Advanced directives List of other physicians Hospitalizations, surgeries, and ER visits in previous 12 months Vitals Screenings to include cognitive, depression, and falls Referrals and appointments  In addition, I have reviewed and discussed with patient certain preventive protocols, quality metrics, and best practice recommendations. A written personalized care plan for preventive services as well as general preventive health recommendations were provided to patient.     Tinnie Gens, RN  05/17/2021

## 2021-05-17 NOTE — Patient Instructions (Addendum)
[place] Maintenance Summary and Written Plan of Care  Ms. Goertzen ,  Thank you for allowing me to perform your Medicare Annual Wellness Visit and for your ongoing commitment to your health.   Health Maintenance & Immunization History Health Maintenance  Topic Date Due   Zoster Vaccines- Shingrix (1 of 2) 08/15/2021 (Originally 11/25/2004)   TETANUS/TDAP  09/21/2021 (Originally 07/08/2020)   Pneumonia Vaccine 14+ Years old (2 - PPSV23 if available, else PCV20) 05/17/2022 (Originally 07/13/2021)   COLONOSCOPY (Pts 45-51yrs Insurance coverage will need to be confirmed)  05/17/2022 (Originally 04/29/2021)   MAMMOGRAM  08/06/2022   INFLUENZA VACCINE  Completed   DEXA SCAN  Completed   COVID-19 Vaccine  Completed   Hepatitis C Screening  Completed   HPV VACCINES  Aged Out   Immunization History  Administered Date(s) Administered   Fluad Quad(high Dose 65+) 03/23/2020, 02/16/2021   Influenza,inj,Quad PF,6+ Mos 03/06/2014, 03/02/2017, 01/31/2018, 01/24/2019   Influenza-Unspecified 04/28/2016   PFIZER(Purple Top)SARS-COV-2 Vaccination 08/09/2019, 08/30/2019, 04/09/2020   Pfizer Covid-19 Vaccine Bivalent Booster 29yrs & up 03/11/2021   Pneumococcal Conjugate-13 07/13/2020   Tdap 07/08/2010   Zoster, Live 04/18/2011    These are the patient goals that we discussed:  Goals Addressed               This Visit's Progress     Patient Stated (pt-stated)        Would like to loose some weight.          This is a list of Health Maintenance Items that are overdue or due now: Td vaccine Colorectal cancer screening Shingrix vaccine    Orders/Referrals Placed Today: Orders Placed This Encounter  Procedures   Ambulatory referral to Gastroenterology (for Colonoscopy)    Referral Priority:   Routine    Referral Type:   Consultation    Referral Reason:   Specialty Services Required    Number of Visits Requested:   1   (Contact our referral department at  [phone] if you have not spoken with someone about your referral appointment within the next 5 days)    Follow-up Plan Follow-up with Donella Stade, PA-C as planned Schedule your appointment at the pharmacy for your tetanus and shingrix vaccine.  Medicare wellness visit in one year.  AVS printed and given to the patient.      Health Maintenance, Female Adopting a healthy lifestyle and getting preventive care are important in promoting health and wellness. Ask your health care provider about: The right schedule for you to have regular tests and exams. Things you can do on your own to prevent diseases and keep yourself healthy. What should I know about diet, weight, and exercise? Eat a healthy diet  Eat a diet that includes plenty of vegetables, fruits, low-fat dairy products, and lean protein. Do not eat a lot of foods that are high in solid fats, added sugars, or sodium. Maintain a healthy weight Body mass index (BMI) is used to identify weight problems. It estimates body fat based on height and weight. Your health care provider can help determine your BMI and help you achieve or maintain a healthy weight. Get regular exercise Get regular exercise. This is one of the most important things you can do for your health. Most adults should: Exercise for at least 150 minutes each week. The exercise should increase your heart rate and make you sweat (moderate-intensity exercise). Do strengthening exercises at least twice a week. This is in addition to  the moderate-intensity exercise. Spend less time sitting. Even light physical activity can be beneficial. Watch cholesterol and blood lipids Have your blood tested for lipids and cholesterol at 66 years of age, then have this test every 5 years. Have your cholesterol levels checked more often if: Your lipid or cholesterol levels are high. You are older than 66 years of age. You are at high risk for heart disease. What should I know  about cancer screening? Depending on your health history and family history, you may need to have cancer screening at various ages. This may include screening for: Breast cancer. Cervical cancer. Colorectal cancer. Skin cancer. Lung cancer. What should I know about heart disease, diabetes, and high blood pressure? Blood pressure and heart disease High blood pressure causes heart disease and increases the risk of stroke. This is more likely to develop in people who have high blood pressure readings or are overweight. Have your blood pressure checked: Every 3-5 years if you are 29-71 years of age. Every year if you are 39 years old or older. Diabetes Have regular diabetes screenings. This checks your fasting blood sugar level. Have the screening done: Once every three years after age 45 if you are at a normal weight and have a low risk for diabetes. More often and at a younger age if you are overweight or have a high risk for diabetes. What should I know about preventing infection? Hepatitis B If you have a higher risk for hepatitis B, you should be screened for this virus. Talk with your health care provider to find out if you are at risk for hepatitis B infection. Hepatitis C Testing is recommended for: Everyone born from [birth year] through 1966. Anyone with known risk factors for hepatitis C. Sexually transmitted infections (STIs) Get screened for STIs, including gonorrhea and chlamydia, if: You are sexually active and are younger than 66 years of age. You are older than 66 years of age and your health care provider tells you that you are at risk for this type of infection. Your sexual activity has changed since you were last screened, and you are at increased risk for chlamydia or gonorrhea. Ask your health care provider if you are at risk. Ask your health care provider about whether you are at high risk for HIV. Your health care provider may recommend a prescription medicine to help prevent  HIV infection. If you choose to take medicine to prevent HIV, you should first get tested for HIV. You should then be tested every 3 months for as long as you are taking the medicine. Pregnancy If you are about to stop having your period (premenopausal) and you may become pregnant, seek counseling before you get pregnant. Take 400 to 800 micrograms (mcg) of folic acid every day if you become pregnant. Ask for birth control (contraception) if you want to prevent pregnancy. Osteoporosis and menopause Osteoporosis is a disease in which the bones lose minerals and strength with aging. This can result in bone fractures. If you are 60 years old or older, or if you are at risk for osteoporosis and fractures, ask your health care provider if you should: Be screened for bone loss. Take a calcium or vitamin D supplement to lower your risk of fractures. Be given hormone replacement therapy (HRT) to treat symptoms of menopause. Follow these instructions at home: Alcohol use Do not drink alcohol if: Your health care provider tells you not to drink. You are pregnant, may be pregnant, or are planning to become  pregnant. If you drink alcohol: Limit how much you have to: 0-1 drink a day. Know how much alcohol is in your drink. In the [place] one drink equals one 12 oz bottle of beer (355 mL), one 5 oz glass of wine (148 mL), or one 1 oz glass of hard liquor (44 mL). Lifestyle Do not use any products that contain nicotine or tobacco. These products include cigarettes, chewing tobacco, and vaping devices, such as e-cigarettes. If you need help quitting, ask your health care provider. Do not use street drugs. Do not share needles. Ask your health care provider for help if you need support or information about quitting drugs. General instructions Schedule regular health, dental, and eye exams. Stay current with your vaccines. Tell your health care provider if: You often feel depressed. You have ever been  abused or do not feel safe at home. Summary Adopting a healthy lifestyle and getting preventive care are important in promoting health and wellness. Follow your health care provider's instructions about healthy diet, exercising, and getting tested or screened for diseases. Follow your health care provider's instructions on monitoring your cholesterol and blood pressure. This information is not intended to replace advice given to you by your health care provider. Make sure you discuss any questions you have with your health care provider. Document Revised: 10/05/2020 Document Reviewed: 10/05/2020 Elsevier Patient Education  [place].

## 2021-06-23 ENCOUNTER — Ambulatory Visit (INDEPENDENT_AMBULATORY_CARE_PROVIDER_SITE_OTHER): Payer: Medicare Other | Admitting: Physician Assistant

## 2021-06-23 ENCOUNTER — Other Ambulatory Visit: Payer: Self-pay

## 2021-06-23 ENCOUNTER — Encounter: Payer: Self-pay | Admitting: Physician Assistant

## 2021-06-23 VITALS — BP 148/80 | HR 75 | Ht 64.0 in | Wt 161.1 lb

## 2021-06-23 DIAGNOSIS — G8929 Other chronic pain: Secondary | ICD-10-CM

## 2021-06-23 DIAGNOSIS — M19071 Primary osteoarthritis, right ankle and foot: Secondary | ICD-10-CM

## 2021-06-23 DIAGNOSIS — I10 Essential (primary) hypertension: Secondary | ICD-10-CM

## 2021-06-23 DIAGNOSIS — Z1211 Encounter for screening for malignant neoplasm of colon: Secondary | ICD-10-CM | POA: Diagnosis not present

## 2021-06-23 DIAGNOSIS — M79671 Pain in right foot: Secondary | ICD-10-CM

## 2021-06-23 DIAGNOSIS — Z23 Encounter for immunization: Secondary | ICD-10-CM

## 2021-06-23 LAB — COMPLETE METABOLIC PANEL WITH GFR
AG Ratio: 2.6 (calc) — ABNORMAL HIGH (ref 1.0–2.5)
ALT: 30 U/L — ABNORMAL HIGH (ref 6–29)
AST: 20 U/L (ref 10–35)
Albumin: 4.9 g/dL (ref 3.6–5.1)
Alkaline phosphatase (APISO): 82 U/L (ref 37–153)
BUN/Creatinine Ratio: 33 (calc) — ABNORMAL HIGH (ref 6–22)
BUN: 16 mg/dL (ref 7–25)
CO2: 31 mmol/L (ref 20–32)
Calcium: 9.5 mg/dL (ref 8.6–10.4)
Chloride: 107 mmol/L (ref 98–110)
Creat: 0.48 mg/dL — ABNORMAL LOW (ref 0.50–1.05)
Globulin: 1.9 g/dL (calc) (ref 1.9–3.7)
Glucose, Bld: 121 mg/dL — ABNORMAL HIGH (ref 65–99)
Potassium: 4.5 mmol/L (ref 3.5–5.3)
Sodium: 143 mmol/L (ref 135–146)
Total Bilirubin: 0.6 mg/dL (ref 0.2–1.2)
Total Protein: 6.8 g/dL (ref 6.1–8.1)
eGFR: 104 mL/min/{1.73_m2} (ref >=60)

## 2021-06-23 MED ORDER — TRAMADOL HCL 50 MG PO TABS: 50.0000 mg | ORAL_TABLET | Freq: Three times a day (TID) | ORAL | 0 refills | Status: DC | PRN

## 2021-06-23 NOTE — Progress Notes (Signed)
Subjective:    Patient ID: Misty [name], female    DOB: 1955-06-05, 66 y.o.   MRN: [id number]  HPI Pt is a 66 yo female who is hearing impaired and has a sign language interpretor.   She is checking BP at home and running 120s/70s. No CP, palpitations, headaches or vision changes. She is using her CPAP machine nightly. She is taking care of her husband and has a lot of stress with this situation. She continues to have right dorsal foot pain. She is seeing Dr. Darene Lamer for this. MRI showed arthritis. She is scared to take tylenol or ibuprofen that it might effect her liver. No SI/HC.   Marland Kitchen. Active Ambulatory Problems    Diagnosis Date Noted   Essential hypertension, benign 12/16/2013   Hyperlipidemia 12/16/2013   Frequent headaches 12/16/2013   Hx of extrinsic asthma 12/16/2013   Pre-diabetes 12/27/2013   Polyp, cervix 09/09/2015   Midline low back pain without sciatica 09/09/2015   Trigger finger, right 05/17/2016   Gluteal tendonitis 07/25/2016   Screening for breast cancer 07/25/2016   Fatigue 12/05/2016   Osteopenia determined by x-ray 12/07/2016   OSA (obstructive sleep apnea) 01/02/2017   Dyspnea 04/13/2017   GERD (gastroesophageal reflux disease) 04/13/2017   Vaginal dryness, menopausal 01/31/2018   Female cystocele 02/12/2019   Constipation 02/12/2019   Female genital prolapse, unspecified 04/15/2020   Stress incontinence 04/15/2020   Primary localized osteoarthrosis of hands, bilateral 07/15/2020   Bilateral hand pain 07/21/2020   Chronic rhinitis 07/21/2020   Reactive airway disease 07/21/2020   Cystocele with prolapse 08/14/2020   Hypokalemia 09/01/2020   Urinary retention 09/01/2020   Status post hysterectomy 09/01/2020   Elevated blood pressure reading without diagnosis of hypertension 09/01/2020   Chest tightness 09/23/2020   Primary osteoarthritis of right foot 11/24/2020   B12 deficiency 12/22/2020   Vitamin D deficiency 12/22/2020   Brittle nails 12/22/2020    Chronic foot pain, right 06/25/2021   Resolved Ambulatory Problems    Diagnosis Date Noted   Osteoarthritis of finger 12/13/2013   Finger pain, right 05/17/2016   Past Medical History:  Diagnosis Date   Allergic rhinitis, seasonal    Anxiety    Complication of anesthesia    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     Objective:   Physical Exam Vitals reviewed.  Constitutional:      Appearance: Normal appearance.  HENT:     Head: Normocephalic.  Neck:     Vascular: No carotid bruit.  Cardiovascular:     Rate and Rhythm: Normal rate and regular rhythm.     Pulses: Normal pulses.     Heart sounds: Normal heart sounds.  Pulmonary:     Effort: Pulmonary effort is normal.     Breath sounds: Normal breath sounds.  Musculoskeletal:     Right lower leg: No edema.     Left lower leg: No edema.     Comments: Right dorsal foot area of edema and tenderness to palpation. No erythema or redness.   Lymphadenopathy:     Cervical: No cervical adenopathy.  Skin:  General: Skin is warm.  Neurological:     General: No focal deficit present.     Mental Status: She is alert and oriented to person, place, and time.  Psychiatric:        Mood and Affect: Mood normal.          Assessment & Plan:  Marland KitchenMarland KitchenDecie was seen today for follow-up.  Diagnoses and all orders for this visit:  Essential hypertension, benign -     COMPLETE METABOLIC PANEL WITH GFR  Primary osteoarthritis of right foot -     traMADol (ULTRAM) 50 MG tablet; Take 1 tablet (50 mg total) by mouth every 8 (eight) hours as needed for moderate pain.  Colon cancer screening -     Ambulatory referral to Gastroenterology  Chronic  foot pain, right -     traMADol (ULTRAM) 50 MG tablet; Take 1 tablet (50 mg total) by mouth every 8 (eight) hours as needed for moderate pain.  Need for prophylactic vaccination with Streptococcus pneumoniae (Pneumococcus) and Influenza vaccines -     Pneumococcal polysaccharide vaccine 23-valent greater than or equal to 2yo subcutaneous/IM   BP not to goal today but brings in great BPs to office of 120s over 70s.  Continue to monitor at home and keep low sodium diet with daily walking.  Follow up in 6 months.   Discussed need for colonoscopy. Ordered today.  Pneumonia shot given today.  Ok to get shingles vaccine at pharmacy.   Recheck liver due to elevated liver enzymes at last lab check.  Continue to follow up with Dr. Darene Lamer for OA in right foot. ..PDMP reviewed during this encounter. Ok for tramadol as needed.   Follow up in 6 months.

## 2021-06-23 NOTE — Patient Instructions (Signed)

## 2021-06-23 NOTE — Progress Notes (Deleted)
°  Subjective:     Patient ID: Madesyn Ast, female   DOB: Jan 29, 1956, 66 y.o.   MRN: [id number]  HPI -foot pain for awhile, lateral foot -injection int he foot from dr t  -bursitis in the foot? -see orthopedic about insert for shoe, see monday -mri from t showed intertarsal arthritis -worse in winter months -tylenol helps a little  -on tumeric  -stopped taking calcium worried about osteoporosis  Review of Systems     Objective:   Physical Exam     Assessment:     ***    Plan:     ***

## 2021-06-25 DIAGNOSIS — G8929 Other chronic pain: Secondary | ICD-10-CM | POA: Insufficient documentation

## 2021-06-25 NOTE — Progress Notes (Signed)
Juliann Pulse,   Kidney function is great. Ok to use NSAID when needed for arthritis pain. ALT down a bit and almost in normal range. As needed tylenol is ok.

## 2021-06-28 ENCOUNTER — Ambulatory Visit (INDEPENDENT_AMBULATORY_CARE_PROVIDER_SITE_OTHER): Payer: Medicare Other | Admitting: Family Medicine

## 2021-06-28 DIAGNOSIS — M19071 Primary osteoarthritis, right ankle and foot: Secondary | ICD-10-CM | POA: Diagnosis not present

## 2021-06-28 NOTE — Progress Notes (Signed)
Misty [name] - 66 y.o. female MRN [id number]  Date of birth: Oct 08, 1955  SUBJECTIVE:  Including CC & ROS.  No chief complaint on file.   Misty [name] is a 66 y.o. female that is here for right foot pain. Pain is worse over the lateral aspect of the forefoot. Worse with walking.  This visit was completed with an person interpreter.  Review of Systems See HPI   HISTORY: Past Medical, Surgical, Social, and Family History Reviewed & Updated per EMR.   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    per pt at age 52 months red measles with high fever;   needs american sign language interperter   Headache    History of kidney stones    Hyperlipidemia    Mild intermittent asthma    followed by pcp  (previously seen by dr Lake Bells, pulmonology)   OA (osteoarthritis)    OSA on CPAP    per pt uses nightly,  moderate osa per study in epic 12-27-2016   Osteopenia determined by x-ray 12/07/2016   DEXA scan July 2018   PONV (postoperative nausea and vomiting)    Pre-diabetes    Self-catheterizes urinary bladder    psot op sling procedure on 08-14-2020   Sleep apnea    Urinary retention    post op sling procedure on 08-14-2020, self cathing   Wears glasses     Past Surgical History:  Procedure Laterality Date   ABDOMINAL HYSTERECTOMY  08/14/2020   bilateral foot surgery      left hand surgery     for arthritis    PUBOVAGINAL SLING N/A 08/14/2020   Procedure: Gaynelle Arabian;  Surgeon: Ardis Hughs, MD;  Location: [facility];  Service: Urology;  Laterality: N/A;   PUBOVAGINAL SLING N/A 10/01/2020   Procedure: URETHRAL LYSIS URETHRAL SLING TAKE DOWN with CYSTOSCOPY;  Surgeon: Ardis Hughs, MD;  Location: [facility];  Service: Urology;  Laterality: N/A;   ROBOTIC ASSISTED LAPAROSCOPIC  HYSTERECTOMY AND SALPINGECTOMY Bilateral 08/14/2020   Procedure: XI ROBOTIC SUPRACERVOCAL HYSTERECTOMY AND BILATERAL SALPINGO OOPHORECTOMY;  Surgeon: Ardis Hughs, MD;  Location: [facility];  Service: Urology;  Laterality: Bilateral;   ROBOTIC ASSISTED LAPAROSCOPIC SACROCOLPOPEXY N/A 08/14/2020   Procedure: XI ROBOTIC  SACROCOLPOPEXY WITH CYSTOSCOPY;  Surgeon: Ardis Hughs, MD;  Location: [facility];  Service: Urology;  Laterality: N/A;     PHYSICAL EXAM:  VS: BP (!) 142/78    Ht 5\' 4"  (1.626 m)    Wt 160 lb (72.6 kg)    BMI 27.46 kg/m  Physical Exam Gen: NAD, alert, cooperative with exam, well-appearing MSK:  Neurovascularly intact    Patient was fitted for a standard, cushioned, semi-rigid orthotic. The orthotic was heated and afterward the patient stood on the orthotic blank positioned on the orthotic stand. The patient was positioned in subtalar neutral position and 10 degrees of ankle dorsiflexion in a weight bearing stance. After completion of molding, a stable base was applied to the orthotic blank. The blank was ground to a stable position for weight bearing. Size: 7 Pairs: 2 Base: Blue EVA Additional Posting and Padding: None The patient ambulated these, and they were very comfortable.    ASSESSMENT & PLAN:   Primary osteoarthritis of right foot Acute on chronic in nature.  Having more pain on the lateral aspect.   -Counseled on home exercise therapy and supportive care. -Orthotics. -Could consider additional posting over the lateral midfoot.

## 2021-06-28 NOTE — Assessment & Plan Note (Signed)
Acute on chronic in nature.  Having more pain on the lateral aspect.   -Counseled on home exercise therapy and supportive care. -Orthotics. -Could consider additional posting over the lateral midfoot.

## 2021-07-02 ENCOUNTER — Other Ambulatory Visit: Payer: Self-pay | Admitting: Physician Assistant

## 2021-07-02 DIAGNOSIS — E782 Mixed hyperlipidemia: Secondary | ICD-10-CM

## 2021-07-21 DIAGNOSIS — Z20828 Contact with and (suspected) exposure to other viral communicable diseases: Secondary | ICD-10-CM | POA: Diagnosis not present

## 2021-09-17 ENCOUNTER — Ambulatory Visit (INDEPENDENT_AMBULATORY_CARE_PROVIDER_SITE_OTHER): Payer: Medicare Other

## 2021-09-17 ENCOUNTER — Ambulatory Visit: Payer: Self-pay

## 2021-09-17 ENCOUNTER — Ambulatory Visit (INDEPENDENT_AMBULATORY_CARE_PROVIDER_SITE_OTHER): Payer: Medicare Other | Admitting: Sports Medicine

## 2021-09-17 DIAGNOSIS — M65321 Trigger finger, right index finger: Secondary | ICD-10-CM

## 2021-09-17 DIAGNOSIS — M19071 Primary osteoarthritis, right ankle and foot: Secondary | ICD-10-CM | POA: Diagnosis not present

## 2021-09-17 NOTE — Progress Notes (Signed)
? ? ?  Procedures performed today:   ? ?Procedure: Real-time Ultrasound Guided injection of the right fourth metatarsocuboid joint. ?Device: Samsung HS60  ?Verbal informed consent obtained.  ?Time-out conducted.  ?Noted no overlying erythema, induration, or other signs of local infection.  ?Skin prepped in a sterile fashion.  ?Local anesthesia: Topical Ethyl chloride.  ?With sterile technique and under real time ultrasound guidance: Noted arthritic joint, 1 cc Kenalog 40, 1 cc lidocaine injected easily ?Completed without difficulty  ?Advised to call if fevers/chills, erythema, induration, drainage, or persistent bleeding.  ?Images permanently stored and available for review in PACS.  ?Impression: Technically successful ultrasound guided injection. ? ?Procedure: Real-time Ultrasound Guided injection of the right second PIP ?Device: Samsung HS60  ?Verbal informed consent obtained.  ?Time-out conducted.  ?Noted no overlying erythema, induration, or other signs of local infection.  ?Skin prepped in a sterile fashion.  ?Local anesthesia: Topical Ethyl chloride.  ?With sterile technique and under real time ultrasound guidance: Noted arthritic joint with surrounding synovitis, 1/2 cc lidocaine, 1/2 cc kenalog 40 injected easily. ?Completed without difficulty  ?Advised to call if fevers/chills, erythema, induration, drainage, or persistent bleeding.  ?Images permanently stored and available for review in PACS.  ?Impression: Technically successful ultrasound guided injection. ? ?Procedure: Real-time Ultrasound Guided injection of the right hand third flexor tendon sheath ?Device: Samsung HS60  ?Verbal informed consent obtained.  ?Time-out conducted.  ?Noted no overlying erythema, induration, or other signs of local infection.  ?Skin prepped in a sterile fashion.  ?Local anesthesia: Topical Ethyl chloride.  ?With sterile technique and under real time ultrasound guidance: Noted effusion, 1/2 cc lidocaine, 1/2 cc kenalog 40  injected easily. ?Completed without difficulty  ?Advised to call if fevers/chills, erythema, induration, drainage, or persistent bleeding.  ?Images permanently stored and available for review in PACS.  ?Impression: Technically successful ultrasound guided injection. ? ?Independent interpretation of notes and tests performed by another provider:  ? ?None. ? ?Brief History, Exam, Impression, and Recommendations:   ? ?Primary osteoarthritis of right foot ?Repeat right fourth metatarsal/cuboid articulation injection. ?Return to see me as needed for this. ? ?Trigger finger, right ?Having lots of pain right hand, pain is referable to the right third flexor tendon sheath as well as the right second PIP, both of these structures were injected today. ? ?Unfortunately the injections were very painful today so in the future I would certainly recommend that we do triazolam preprocedural anxiolysis. ? ?Chronic process with exacerbation and pharmacologic intervention. ? ?___________________________________________ ?Gwen Her. Dianah Field, M.D., ABFM., CAQSM. ?Primary Care and Sports Medicine ?[place] ? ?Adjunct Instructor of Family Medicine  ?[employer] ?

## 2021-09-17 NOTE — Assessment & Plan Note (Addendum)
Having lots of pain right hand, pain is referable to the right third flexor tendon sheath as well as the right second PIP, both of these structures were injected today. ? ?Unfortunately the injections were very painful today so in the future I would certainly recommend that we do triazolam preprocedural anxiolysis. ?

## 2021-09-17 NOTE — Assessment & Plan Note (Signed)
Repeat right fourth metatarsal/cuboid articulation injection. ?Return to see me as needed for this. ?

## 2021-09-24 ENCOUNTER — Other Ambulatory Visit: Payer: Self-pay | Admitting: Physician Assistant

## 2021-09-24 DIAGNOSIS — E782 Mixed hyperlipidemia: Secondary | ICD-10-CM

## 2021-09-24 DIAGNOSIS — J310 Chronic rhinitis: Secondary | ICD-10-CM

## 2021-10-28 ENCOUNTER — Ambulatory Visit: Payer: Medicare Other | Admitting: Sports Medicine

## 2021-12-08 ENCOUNTER — Other Ambulatory Visit: Payer: Self-pay | Admitting: Physician Assistant

## 2021-12-08 DIAGNOSIS — E782 Mixed hyperlipidemia: Secondary | ICD-10-CM

## 2021-12-17 ENCOUNTER — Encounter: Payer: Self-pay | Admitting: Physician Assistant

## 2021-12-17 ENCOUNTER — Ambulatory Visit (INDEPENDENT_AMBULATORY_CARE_PROVIDER_SITE_OTHER): Payer: Medicare Other | Admitting: Physician Assistant

## 2021-12-17 VITALS — BP 126/70 | HR 76 | Ht 64.0 in | Wt 158.0 lb

## 2021-12-17 DIAGNOSIS — Z1329 Encounter for screening for other suspected endocrine disorder: Secondary | ICD-10-CM

## 2021-12-17 DIAGNOSIS — I10 Essential (primary) hypertension: Secondary | ICD-10-CM

## 2021-12-17 DIAGNOSIS — E782 Mixed hyperlipidemia: Secondary | ICD-10-CM

## 2021-12-17 DIAGNOSIS — R079 Chest pain, unspecified: Secondary | ICD-10-CM

## 2021-12-17 DIAGNOSIS — R7303 Prediabetes: Secondary | ICD-10-CM | POA: Diagnosis not present

## 2021-12-17 DIAGNOSIS — R1013 Epigastric pain: Secondary | ICD-10-CM | POA: Diagnosis not present

## 2021-12-17 DIAGNOSIS — Z0000 Encounter for general adult medical examination without abnormal findings: Secondary | ICD-10-CM

## 2021-12-17 DIAGNOSIS — E538 Deficiency of other specified B group vitamins: Secondary | ICD-10-CM

## 2021-12-17 DIAGNOSIS — E559 Vitamin D deficiency, unspecified: Secondary | ICD-10-CM

## 2021-12-17 MED ORDER — OMEPRAZOLE 40 MG PO CPDR: 40.0000 mg | DELAYED_RELEASE_CAPSULE | Freq: Every day | ORAL | 2 refills | Status: DC

## 2021-12-17 NOTE — Patient Instructions (Addendum)
Omeprazole daily in the morning Follow up after stress test   Food Choices for Gastroesophageal Reflux Disease, Adult When you have gastroesophageal reflux disease (GERD), the foods you eat and your eating habits are very important. Choosing the right foods can help ease your discomfort. Think about working with a food expert (dietitian) to help you make good choices. What are tips for following this plan? Reading food labels Look for foods that are low in saturated fat. Foods that may help with your symptoms include: Foods that have less than 5% of daily value (DV) of fat. Foods that have 0 grams of trans fat. Cooking Do not fry your food. Cook your food by baking, steaming, grilling, or broiling. These are all methods that do not need a lot of fat for cooking. To add flavor, try to use herbs that are low in spice and acidity. Meal planning  Choose healthy foods that are low in fat, such as: Fruits and vegetables. Whole grains. Low-fat dairy products. Lean meats, fish, and poultry. Eat small meals often instead of eating 3 large meals each day. Eat your meals slowly in a place where you are relaxed. Avoid bending over or lying down until 2-3 hours after eating. Limit high-fat foods such as fatty meats or fried foods. Limit your intake of fatty foods, such as oils, butter, and shortening. Avoid the following as told by your doctor: Foods that cause symptoms. These may be different for different people. Keep a food diary to keep track of foods that cause symptoms. Alcohol. Drinking a lot of liquid with meals. Eating meals during the 2-3 hours before bed. Lifestyle Stay at a healthy weight. Ask your doctor what weight is healthy for you. If you need to lose weight, work with your doctor to do so safely. Exercise for at least 30 minutes on 5 or more days each week, or as told by your doctor. Wear loose-fitting clothes. Do not smoke or use any products that contain nicotine or  tobacco. If you need help quitting, ask your doctor. Sleep with the head of your bed higher than your feet. Use a wedge under the mattress or blocks under the bed frame to raise the head of the bed. Chew sugar-free gum after meals. What foods should eat?  Eat a healthy, well-balanced diet of fruits, vegetables, whole grains, low-fat dairy products, lean meats, fish, and poultry. Each person is different. Foods that may cause symptoms in one person may not cause any symptoms in another person. Work with your doctor to find foods that are safe for you. The items listed above may not be a complete list of what you can eat and drink. Contact a food expert for more options. What foods should I avoid? Limiting some of these foods may help in managing the symptoms of GERD. Everyone is different. Talk with a food expert or your doctor to help you find the exact foods to avoid, if any. Fruits Any fruits prepared with added fat. Any fruits that cause symptoms. For some people, this may include citrus fruits, such as oranges, grapefruit, pineapple, and lemons. Vegetables Deep-fried vegetables. [place] fries. Any vegetables prepared with added fat. Any vegetables that cause symptoms. For some people, this may include tomatoes and tomato products, chili peppers, onions and garlic, and horseradish. Grains Pastries or quick breads with added fat. Meats and other proteins High-fat meats, such as fatty beef or pork, hot dogs, ribs, ham, sausage, salami, and bacon. Fried meat or protein, including fried fish  and fried chicken. Nuts and nut butters, in large amounts. Dairy Whole milk and chocolate milk. Sour cream. Cream. Ice cream. Cream cheese. Milkshakes. Fats and oils Butter. Margarine. Shortening. Ghee. Beverages Coffee and tea, with or without caffeine. Carbonated beverages. Sodas. Energy drinks. Fruit juice made with acidic fruits, such as orange or grapefruit. Tomato juice. Alcoholic drinks. Sweets and  desserts Chocolate and cocoa. Donuts. Seasonings and condiments Pepper. Peppermint and spearmint. Added salt. Any condiments, herbs, or seasonings that cause symptoms. For some people, this may include curry, hot sauce, or vinegar-based salad dressings. The items listed above may not be a complete list of what you should not eat and drink. Contact a food expert for more options. Questions to ask your doctor Diet and lifestyle changes are often the first steps that are taken to manage symptoms of GERD. If diet and lifestyle changes do not help, talk with your doctor about taking medicines. Where to find more information [employer] for G[employer]intestinal [employer]: aboutgerd.org Summary When you have GERD, food and lifestyle choices are very important in easing your symptoms. Eat small meals often instead of 3 large meals a day. Eat your meals slowly and in a place where you are relaxed. Avoid bending over or lying down until 2-3 hours after eating. Limit high-fat foods such as fatty meats or fried foods. This information is not intended to replace advice given to you by your health care provider. Make sure you discuss any questions you have with your health care provider. Document Revised: 11/25/2019 Document Reviewed: 11/25/2019 Elsevier Patient Education  [place].

## 2021-12-17 NOTE — Progress Notes (Unsigned)
Established Patient Office Visit  Subjective   Patient ID: Misty [name], female    DOB: [birth year]  Age: 66 y.o. MRN: [id number]  Chief Complaint  Patient presents with   Follow-up    HPI Pt is a 66 yo female with HTN, OSA, GERD, pre-diabetes who presents to the clinic for medication refills. She is hearing impaired and accompanied by a Optometrist.  She is doing fairly well. She has had a few episodes of sternal pain and epigastric discomfort. She is also a little short of breath when this occurs. Seems to be a little more frequent in the last week or so. She is not taking anything for reflux. She did admit once it worsened with walking around and helping her husband to chores.  .. Active Ambulatory Problems    Diagnosis Date Noted   Essential hypertension, benign 12/16/2013   Hyperlipidemia 12/16/2013   Frequent headaches 12/16/2013   Hx of extrinsic asthma 12/16/2013   Pre-diabetes 12/27/2013   Polyp, cervix 09/09/2015   Midline low back pain without sciatica 09/09/2015   Trigger finger, right 05/17/2016   Gluteal tendonitis 07/25/2016   Screening for breast cancer 07/25/2016   Fatigue 12/05/2016   Osteopenia determined by x-ray 12/07/2016   OSA (obstructive sleep apnea) 01/02/2017   Dyspnea 04/13/2017   GERD (gastroesophageal reflux disease) 04/13/2017   Vaginal dryness, menopausal 01/31/2018   Female cystocele 02/12/2019   Constipation 02/12/2019   Female genital prolapse, unspecified 04/15/2020   Stress incontinence 04/15/2020   Primary localized osteoarthrosis of hands, bilateral 07/15/2020   Bilateral hand pain 07/21/2020   Chronic rhinitis 07/21/2020   Reactive airway disease 07/21/2020   Cystocele with prolapse 08/14/2020   Hypokalemia 09/01/2020   Urinary retention 09/01/2020   Status post hysterectomy 09/01/2020   Elevated blood pressure reading without diagnosis of hypertension 09/01/2020   Chest tightness 09/23/2020   Primary osteoarthritis of right foot  11/24/2020   B12 deficiency 12/22/2020   Vitamin D deficiency 12/22/2020   Brittle nails 12/22/2020   Chronic foot pain, right 06/25/2021   Resolved Ambulatory Problems    Diagnosis Date Noted   Osteoarthritis of finger 12/13/2013   Finger pain, right 05/17/2016   Past Medical History:  Diagnosis Date   Allergic rhinitis, seasonal    Anxiety    Complication of anesthesia    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    Objective:     BP 126/70   Pulse 76   Ht '5\' 4"'$  (1.626 m)   Wt 158 lb (71.7 kg)   SpO2 96%   BMI 27.12 kg/m  BP Readings from Last 3 Encounters:  12/17/21 126/70  06/28/21 (!) 142/78  06/23/21 (!) 148/80   Wt Readings from Last 3 Encounters:  12/17/21 158 lb (71.7 kg)  06/28/21 160 lb (72.6 kg)  06/23/21 161 lb 1.3 oz (73.1 kg)      Physical Exam Constitutional:      Appearance: Normal appearance. She is obese.  HENT:     Head: Normocephalic.  Cardiovascular:     Rate and Rhythm: Normal rate and regular rhythm.     Pulses: Normal pulses.  Heart sounds: Normal heart sounds.  Pulmonary:     Effort: Pulmonary effort is normal.     Breath sounds: Normal breath sounds.  Abdominal:     General: Bowel sounds are normal. There is no distension.     Palpations: Abdomen is soft. There is no mass.     Tenderness: There is no abdominal tenderness. There is no right CVA tenderness, left CVA tenderness, guarding or rebound.  Musculoskeletal:     Right lower leg: No edema.     Left lower leg: No edema.  Neurological:     General: No focal deficit present.     Mental Status: She is alert and oriented to person, place, and time.  Psychiatric:        Mood and Affect: Mood normal.    ..    05/17/2021    9:12  AM 12/21/2020    1:40 PM 07/13/2020    2:54 PM 02/11/2019    2:27 PM 01/31/2018    8:39 AM  Depression screen PHQ 2/9  Decreased Interest 0 0 '1 2 1  '$ Down, Depressed, Hopeless 0 0 '1 2 1  '$ PHQ - 2 Score 0 0 '2 4 2  '$ Altered sleeping   1 0 0  Tired, decreased energy   '1 2 1  '$ Change in appetite   1 0 0  Feeling bad or failure about yourself    0 0 0  Trouble concentrating   1 0 0  Moving slowly or fidgety/restless   0 0 0  Suicidal thoughts   0 0 0  PHQ-9 Score   '6 6 3  '$ Difficult doing work/chores   Not difficult at all  Somewhat difficult   .Marland Kitchen    07/13/2020    2:55 PM 02/11/2019    2:27 PM 01/31/2018    8:39 AM  GAD 7 : Generalized Anxiety Score  Nervous, Anxious, on Edge 0 0 0  Control/stop worrying 0 0 0  Worry too much - different things 0 2 0  Trouble relaxing 0 0 0  Restless 0 0 0  Easily annoyed or irritable 0 0 0  Afraid - awful might happen 0 0 0  Total GAD 7 Score 0 2 0  Anxiety Difficulty Not difficult at all  Not difficult at all           Assessment & Plan:  Marland KitchenMarland KitchenKhalise was seen today for follow-up.  Diagnoses and all orders for this visit:  Essential hypertension, benign -     COMPLETE METABOLIC PANEL WITH GFR  Vitamin D deficiency -     Vitamin D (25 hydroxy)  B12 deficiency -     Vitamin B12  Mixed hyperlipidemia -     Lipid Panel w/reflex Direct LDL  Pre-diabetes -     COMPLETE METABOLIC PANEL WITH GFR -     Hemoglobin A1c  Thyroid disorder screening -     TSH  Preventative health care -     TSH -     Lipid Panel w/reflex Direct LDL -     COMPLETE METABOLIC PANEL WITH GFR -     Vitamin B12 -     Vitamin D (25 hydroxy) -     Hemoglobin A1c  Chest pain, unspecified type -     omeprazole (PRILOSEC) 40 MG capsule; Take 1 capsule (40 mg total) by mouth daily. -     EXERCISE TOLERANCE TEST (ETT); Future -     Cardiac Stress Test: Informed Consent Details: Physician/Practitioner  Attestation; Transcribe to consent form and obtain patient  signature  Epigastric discomfort -     omeprazole (PRILOSEC) 40 MG capsule; Take 1 capsule (40 mg total) by mouth daily. -     EXERCISE TOLERANCE TEST (ETT); Future -     Cardiac Stress Test: Informed Consent Details: Physician/Practitioner Attestation; Transcribe to consent form and obtain patient signature   Needs labs.  BP better on 2nd recheck Symptoms sound like gastritis but they did worsen some with exertion. Will start omeprazole daily and get stress test.  Discussed GERD diet.  Follow up in 1 month.    Iran Planas, PA-C

## 2021-12-21 ENCOUNTER — Ambulatory Visit: Payer: Medicare Other | Admitting: Physician Assistant

## 2021-12-21 DIAGNOSIS — Z0000 Encounter for general adult medical examination without abnormal findings: Secondary | ICD-10-CM | POA: Diagnosis not present

## 2021-12-21 DIAGNOSIS — Z1329 Encounter for screening for other suspected endocrine disorder: Secondary | ICD-10-CM | POA: Diagnosis not present

## 2021-12-21 DIAGNOSIS — I10 Essential (primary) hypertension: Secondary | ICD-10-CM | POA: Diagnosis not present

## 2021-12-21 DIAGNOSIS — E538 Deficiency of other specified B group vitamins: Secondary | ICD-10-CM | POA: Diagnosis not present

## 2021-12-21 DIAGNOSIS — E559 Vitamin D deficiency, unspecified: Secondary | ICD-10-CM | POA: Diagnosis not present

## 2021-12-21 DIAGNOSIS — E782 Mixed hyperlipidemia: Secondary | ICD-10-CM | POA: Diagnosis not present

## 2021-12-21 DIAGNOSIS — R7303 Prediabetes: Secondary | ICD-10-CM | POA: Diagnosis not present

## 2021-12-22 LAB — HEMOGLOBIN A1C
Hgb A1c MFr Bld: 6.1 % of total Hgb — ABNORMAL HIGH (ref <5.7)
Mean Plasma Glucose: 128 mg/dL
eAG (mmol/L): 7.1 mmol/L

## 2021-12-22 LAB — TSH: TSH: 1.63 mIU/L (ref 0.40–4.50)

## 2021-12-22 LAB — COMPLETE METABOLIC PANEL WITH GFR
AG Ratio: 2.2 (calc) (ref 1.0–2.5)
ALT: 29 U/L (ref 6–29)
AST: 19 U/L (ref 10–35)
Albumin: 4.6 g/dL (ref 3.6–5.1)
Alkaline phosphatase (APISO): 77 U/L (ref 37–153)
BUN: 15 mg/dL (ref 7–25)
CO2: 26 mmol/L (ref 20–32)
Calcium: 9.6 mg/dL (ref 8.6–10.4)
Chloride: 107 mmol/L (ref 98–110)
Creat: 0.52 mg/dL (ref 0.50–1.05)
Globulin: 2.1 g/dL (calc) (ref 1.9–3.7)
Glucose, Bld: 114 mg/dL — ABNORMAL HIGH (ref 65–99)
Potassium: 4.2 mmol/L (ref 3.5–5.3)
Sodium: 142 mmol/L (ref 135–146)
Total Bilirubin: 0.7 mg/dL (ref 0.2–1.2)
Total Protein: 6.7 g/dL (ref 6.1–8.1)
eGFR: 102 mL/min/{1.73_m2} (ref >=60)

## 2021-12-22 LAB — VITAMIN D 25 HYDROXY (VIT D DEFICIENCY, FRACTURES): Vit D, 25-Hydroxy: 49 ng/mL (ref 30–100)

## 2021-12-22 LAB — LIPID PANEL W/REFLEX DIRECT LDL
Cholesterol: 204 mg/dL — ABNORMAL HIGH (ref <200)
HDL: 58 mg/dL (ref >=50)
LDL Cholesterol (Calc): 121 mg/dL (calc) — ABNORMAL HIGH
Non-HDL Cholesterol (Calc): 146 mg/dL (calc) — ABNORMAL HIGH (ref <130)
Total CHOL/HDL Ratio: 3.5 (calc) (ref <5.0)
Triglycerides: 133 mg/dL (ref <150)

## 2021-12-22 LAB — VITAMIN B12: Vitamin B-12: 568 pg/mL (ref 200–1100)

## 2021-12-26 ENCOUNTER — Other Ambulatory Visit: Payer: Self-pay | Admitting: Physician Assistant

## 2021-12-26 DIAGNOSIS — E782 Mixed hyperlipidemia: Secondary | ICD-10-CM

## 2021-12-26 MED ORDER — SIMVASTATIN 40 MG PO TABS: 40.0000 mg | ORAL_TABLET | Freq: Every day | ORAL | 1 refills | Status: DC

## 2021-12-26 NOTE — Progress Notes (Signed)
Misty [name],   Vitamin D much better. Continue same dose.  B12 looks great.  Thyroid looks wonderful.  A1C is better than one year ago. Continues to be in pre-diabetic range but improving.  Kidney and liver look good.  Stay on zocor. LDL a little more elevated but HDL looks good.

## 2022-01-14 ENCOUNTER — Ambulatory Visit (INDEPENDENT_AMBULATORY_CARE_PROVIDER_SITE_OTHER): Payer: Medicare Other | Admitting: Physician Assistant

## 2022-01-14 ENCOUNTER — Encounter: Payer: Self-pay | Admitting: Physician Assistant

## 2022-01-14 VITALS — BP 142/84 | HR 93 | Ht 64.0 in | Wt 159.0 lb

## 2022-01-14 DIAGNOSIS — R079 Chest pain, unspecified: Secondary | ICD-10-CM

## 2022-01-14 DIAGNOSIS — I10 Essential (primary) hypertension: Secondary | ICD-10-CM | POA: Diagnosis not present

## 2022-01-14 DIAGNOSIS — R1013 Epigastric pain: Secondary | ICD-10-CM

## 2022-01-14 NOTE — Progress Notes (Signed)
Established Patient Office Visit  Subjective   Patient ID: Misty [name], female    DOB: February 25, 1956  Age: 66 y.o. MRN: [id number]  No chief complaint on file.   HPI Pt is a 66 yo female with HTN, OSA, GERD, pre-diabetes who presents to the clinic for follow up on epigastric and chest pain. She is hearing impaired and accompanied by translator.   She is doing much better on omeprazole. Symptoms have resolved. She notices some foods do cause some epigastric/chest pain but resolved with tums. No palpitations, headaches or vision changes. Pt does not exercise. She does take care of her disabled husband and does not have a lot of time for herself. She is not checking her BP. She is not wearing her CPAP.   Stress test? Omeprazole gasritis  Patient Active Problem List   Diagnosis Date Noted   Epigastric discomfort 01/14/2022   Chest pain 01/14/2022   Chronic foot pain, right 06/25/2021   B12 deficiency 12/22/2020   Vitamin D deficiency 12/22/2020   Brittle nails 12/22/2020   Primary osteoarthritis of right foot 11/24/2020   Chest tightness 09/23/2020   Hypokalemia 09/01/2020   Urinary retention 09/01/2020   Status post hysterectomy 09/01/2020   Elevated blood pressure reading without diagnosis of hypertension 09/01/2020   Cystocele with prolapse 08/14/2020   Bilateral hand pain 07/21/2020   Chronic rhinitis 07/21/2020   Reactive airway disease 07/21/2020   Primary localized osteoarthrosis of hands, bilateral 07/15/2020   Female genital prolapse, unspecified 04/15/2020   Stress incontinence 04/15/2020   Female cystocele 02/12/2019   Constipation 02/12/2019   Vaginal dryness, menopausal 01/31/2018   Dyspnea 04/13/2017   GERD (gastroesophageal reflux disease) 04/13/2017   OSA (obstructive sleep apnea) 01/02/2017   Osteopenia determined by x-ray 12/07/2016   Fatigue 12/05/2016   Gluteal tendonitis 07/25/2016   Screening for breast cancer 07/25/2016   Trigger finger, right  05/17/2016   Polyp, cervix 09/09/2015   Midline low back pain without sciatica 09/09/2015   Pre-diabetes 12/27/2013   Essential hypertension, benign 12/16/2013   Hyperlipidemia 12/16/2013   Frequent headaches 12/16/2013   Hx of extrinsic asthma 12/16/2013   Past Medical History:  Diagnosis Date   Allergic rhinitis, seasonal    Anxiety    Complication of anesthesia    post op migraines   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    per pt at age 8 months red measles with high fever;   needs american sign language interperter   Headache    History of kidney stones    Hyperlipidemia    Mild intermittent asthma    followed by pcp  (previously seen by dr Lake Bells, pulmonology)   OA (osteoarthritis)    OSA on CPAP    per pt uses nightly,  moderate osa per study in epic 12-27-2016   Osteopenia determined by x-ray 12/07/2016   DEXA scan July 2018   PONV (postoperative nausea and vomiting)    Pre-diabetes    Self-catheterizes urinary bladder    psot op sling procedure on 08-14-2020   Sleep apnea    Urinary retention    post op sling procedure on 08-14-2020, self cathing   Wears glasses    Family History  Problem Relation Age of Onset   Hyperlipidemia Mother    Cancer Father        prostate   Diabetes Father    Arthritis Father    Cancer Maternal Grandfather        colon cancer  Stroke Maternal Grandfather    Breast cancer Sister    Cancer Maternal Grandmother    Cancer Paternal Grandfather    Allergies  Allergen Reactions   Monosodium Glutamate     headache      ROS See HPI.    Objective:     There were no vitals taken for this visit. BP Readings from Last 3 Encounters:  01/14/22 (!) 142/84  12/17/21 126/70  06/28/21 (!) 142/78   Wt Readings from Last 3 Encounters:  01/14/22 159 lb (72.1 kg)  12/17/21 158 lb (71.7 kg)  06/28/21 160 lb (72.6 kg)      Physical Exam Constitutional:      Appearance: Normal appearance.  HENT:     Head: Normocephalic.   Cardiovascular:     Rate and Rhythm: Normal rate and regular rhythm.     Pulses: Normal pulses.     Heart sounds: Normal heart sounds.  Pulmonary:     Effort: Pulmonary effort is normal.     Breath sounds: Normal breath sounds.  Abdominal:     General: There is no distension.     Palpations: Abdomen is soft.     Tenderness: There is no abdominal tenderness.  Musculoskeletal:     Right lower leg: No edema.     Left lower leg: No edema.  Neurological:     General: No focal deficit present.     Mental Status: She is alert and oriented to person, place, and time.  Psychiatric:        Mood and Affect: Mood normal.        Assessment & Plan:  Misty [name] KitchenMarland KitchenTanekia was seen today for follow-up.  Diagnoses and all orders for this visit:  Essential hypertension, benign  Epigastric discomfort  Chest pain, unspecified type   Continue omeprazole Continue to watch diet and consider food triggers Tums or pepcid as needed with triggering foods No need to schedule stress test since symptoms resolved with GERD treatment  BP not to goal 2nd check just a little better Pt does not want to start medication Discussed low salt diet and 150 minutes of exercise a week Avoid any fried or processed food Start wearing CPAP again call company if feels like pressure setting is too high for them to set it Keep BP log and return in 2 months if still borderline we need to consider medication   Iran Planas, PA-C

## 2022-01-14 NOTE — Patient Instructions (Signed)
Managing Your Hypertension Hypertension, also called high blood pressure, is when the force of the blood pressing against the walls of the arteries is too strong. Arteries are blood vessels that carry blood from your heart throughout your body. Hypertension forces the heart to work harder to pump blood and may cause the arteries to become narrow or stiff. Understanding blood pressure readings A blood pressure reading includes a higher number over a lower number: The first, or top, number is called the systolic pressure. It is a measure of the pressure in your arteries as your heart beats. The second, or bottom number, is called the diastolic pressure. It is a measure of the pressure in your arteries as the heart relaxes. For most people, a normal blood pressure is below 120/80. Your personal target blood pressure may vary depending on your medical conditions, your age, and other factors. Blood pressure is classified into four stages. Based on your blood pressure reading, your health care provider may use the following stages to determine what type of treatment you need, if any. Systolic pressure and diastolic pressure are measured in a unit called millimeters of mercury (mmHg). Normal Systolic pressure: below 120. Diastolic pressure: below 80. Elevated Systolic pressure: 120-129. Diastolic pressure: below 80. Hypertension stage 1 Systolic pressure: 130-139. Diastolic pressure: 80-89. Hypertension stage 2 Systolic pressure: 140 or above. Diastolic pressure: 90 or above. How can this condition affect me? Managing your hypertension is very important. Over time, hypertension can damage the arteries and decrease blood flow to parts of the body, including the brain, heart, and kidneys. Having untreated or uncontrolled hypertension can lead to: A heart attack. A stroke. A weakened blood vessel (aneurysm). Heart failure. Kidney damage. Eye damage. Memory and concentration problems. Vascular  dementia. What actions can I take to manage this condition? Hypertension can be managed by making lifestyle changes and possibly by taking medicines. Your health care provider will help you make a plan to bring your blood pressure within a normal range. You may be referred for counseling on a healthy diet and physical activity. Nutrition  Eat a diet that is high in fiber and potassium, and low in salt (sodium), added sugar, and fat. An example eating plan is called the DASH diet. DASH stands for Dietary Approaches to Stop Hypertension. To eat this way: Eat plenty of fresh fruits and vegetables. Try to fill one-half of your plate at each meal with fruits and vegetables. Eat whole grains, such as whole-wheat pasta, brown rice, or whole-grain bread. Fill about one-fourth of your plate with whole grains. Eat low-fat dairy products. Avoid fatty cuts of meat, processed or cured meats, and poultry with skin. Fill about one-fourth of your plate with lean proteins such as fish, chicken without skin, beans, eggs, and tofu. Avoid pre-made and processed foods. These tend to be higher in sodium, added sugar, and fat. Reduce your daily sodium intake. Many people with hypertension should eat less than 1,500 mg of sodium a day. Lifestyle  Work with your health care provider to maintain a healthy body weight or to lose weight. Ask what an ideal weight is for you. Get at least 30 minutes of exercise that causes your heart to beat faster (aerobic exercise) most days of the week. Activities may include walking, swimming, or biking. Include exercise to strengthen your muscles (resistance exercise), such as weight lifting, as part of your weekly exercise routine. Try to do these types of exercises for 30 minutes at least 3 days a week. Do   not use any products that contain nicotine or tobacco. These products include cigarettes, chewing tobacco, and vaping devices, such as e-cigarettes. If you need help quitting, ask your  health care provider. Control any long-term (chronic) conditions you have, such as high cholesterol or diabetes. Identify your sources of stress and find ways to manage stress. This may include meditation, deep breathing, or making time for fun activities. Alcohol use Do not drink alcohol if: Your health care provider tells you not to drink. You are pregnant, may be pregnant, or are planning to become pregnant. If you drink alcohol: Limit how much you have to: 0-1 drink a day for women. 0-2 drinks a day for men. Know how much alcohol is in your drink. In the [place] one drink equals one 12 oz bottle of beer (355 mL), one 5 oz glass of wine (148 mL), or one 1 oz glass of hard liquor (44 mL). Medicines Your health care provider may prescribe medicine if lifestyle changes are not enough to get your blood pressure under control and if: Your systolic blood pressure is 130 or higher. Your diastolic blood pressure is 80 or higher. Take medicines only as told by your health care provider. Follow the directions carefully. Blood pressure medicines must be taken as told by your health care provider. The medicine does not work as well when you skip doses. Skipping doses also puts you at risk for problems. Monitoring Before you monitor your blood pressure: Do not smoke, drink caffeinated beverages, or exercise within 30 minutes before taking a measurement. Use the bathroom and empty your bladder (urinate). Sit quietly for at least 5 minutes before taking measurements. Monitor your blood pressure at home as told by your health care provider. To do this: Sit with your back straight and supported. Place your feet flat on the floor. Do not cross your legs. Support your arm on a flat surface, such as a table. Make sure your upper arm is at heart level. Each time you measure, take two or three readings one minute apart and record the results. You may also need to have your blood pressure checked regularly by  your health care provider. General information Talk with your health care provider about your diet, exercise habits, and other lifestyle factors that may be contributing to hypertension. Review all the medicines you take with your health care provider because there may be side effects or interactions. Keep all follow-up visits. Your health care provider can help you create and adjust your plan for managing your high blood pressure. Where to find more information National Heart, Lung, and Blood Institute: www.nhlbi.nih.gov American Heart Association: www.heart.org Contact a health care provider if: You think you are having a reaction to medicines you have taken. You have repeated (recurrent) headaches. You feel dizzy. You have swelling in your ankles. You have trouble with your vision. Get help right away if: You develop a severe headache or confusion. You have unusual weakness or numbness, or you feel faint. You have severe pain in your chest or abdomen. You vomit repeatedly. You have trouble breathing. These symptoms may be an emergency. Get help right away. Call 911. Do not wait to see if the symptoms will go away. Do not drive yourself to the hospital. Summary Hypertension is when the force of blood pumping through your arteries is too strong. If this condition is not controlled, it may put you at risk for serious complications. Your personal target blood pressure may vary depending on your medical conditions,   your age, and other factors. For most people, a normal blood pressure is less than 120/80. Hypertension is managed by lifestyle changes, medicines, or both. Lifestyle changes to help manage hypertension include losing weight, eating a healthy, low-sodium diet, exercising more, stopping smoking, and limiting alcohol. This information is not intended to replace advice given to you by your health care provider. Make sure you discuss any questions you have with your health care  provider. Document Revised: 01/28/2021 Document Reviewed: 01/28/2021 Elsevier Patient Education  2023 Elsevier Inc.  

## 2022-02-06 ENCOUNTER — Encounter: Payer: Self-pay | Admitting: Sports Medicine

## 2022-02-06 DIAGNOSIS — M19041 Primary osteoarthritis, right hand: Secondary | ICD-10-CM

## 2022-02-07 MED ORDER — TRIAZOLAM 0.25 MG PO TABS: ORAL_TABLET | ORAL | 0 refills | Status: DC

## 2022-02-10 ENCOUNTER — Ambulatory Visit: Payer: Medicare Other

## 2022-02-10 ENCOUNTER — Ambulatory Visit (INDEPENDENT_AMBULATORY_CARE_PROVIDER_SITE_OTHER): Payer: Medicare Other | Admitting: Sports Medicine

## 2022-02-10 ENCOUNTER — Encounter: Payer: Self-pay | Admitting: Sports Medicine

## 2022-02-10 ENCOUNTER — Ambulatory Visit (INDEPENDENT_AMBULATORY_CARE_PROVIDER_SITE_OTHER): Payer: Medicare Other

## 2022-02-10 DIAGNOSIS — M19071 Primary osteoarthritis, right ankle and foot: Secondary | ICD-10-CM

## 2022-02-10 DIAGNOSIS — M19041 Primary osteoarthritis, right hand: Secondary | ICD-10-CM

## 2022-02-10 DIAGNOSIS — M19042 Primary osteoarthritis, left hand: Secondary | ICD-10-CM

## 2022-02-10 MED ORDER — CELECOXIB 200 MG PO CAPS: ORAL_CAPSULE | ORAL | 2 refills | Status: DC

## 2022-02-10 NOTE — Assessment & Plan Note (Signed)
Recurrence of pain, repeat right fourth metatarsal/cuboid articulation today, last in April, return to see me as needed.

## 2022-02-10 NOTE — Assessment & Plan Note (Addendum)
This pleasant 66 year old female returns, she has bilateral hand osteoarthritis, right worse than left, she does have some finger triggering as well, today her pain is predominantly second PIP as well as first [place] on the right, these 2 structures were injected today. She is going to try celecoxib.

## 2022-02-10 NOTE — Progress Notes (Signed)
    Procedures performed today:    Procedure: Real-time Ultrasound Guided injection of the right fourth metatarsocuboid joint. Device: Samsung HS60  Verbal informed consent obtained.  Time-out conducted.  Noted no overlying erythema, induration, or other signs of local infection.  Skin prepped in a sterile fashion.  Local anesthesia: Topical Ethyl chloride.  With sterile technique and under real time ultrasound guidance: Noted arthritic joint, 1 cc Kenalog 40, 1 cc lidocaine injected easily Completed without difficulty  Advised to call if fevers/chills, erythema, induration, drainage, or persistent bleeding.  Images permanently stored and available for review in PACS.  Impression: Technically successful ultrasound guided injection.   Procedure: Real-time Ultrasound Guided injection of the right second PIP Device: Samsung HS60  Verbal informed consent obtained.  Time-out conducted.  Noted no overlying erythema, induration, or other signs of local infection.  Skin prepped in a sterile fashion.  Local anesthesia: Topical Ethyl chloride.  With sterile technique and under real time ultrasound guidance: Noted arthritic joint with surrounding synovitis, 1/2 cc lidocaine, 1/2 cc kenalog 40 injected easily. Completed without difficulty  Advised to call if fevers/chills, erythema, induration, drainage, or persistent bleeding.  Images permanently stored and available for review in PACS.  Impression: Technically successful ultrasound guided injection.   Procedure: Real-time Ultrasound Guided injection of the right first [facility] Device: Samsung HS60  Verbal informed consent obtained.  Time-out conducted.  Noted no overlying erythema, induration, or other signs of local infection.  Skin prepped in a sterile fashion.  Local anesthesia: Topical Ethyl chloride.  With sterile technique and under real time ultrasound guidance: Noted arthritic joint, 1/2 cc lidocaine, 1/2 cc kenalog 40 injected  easily. Completed without difficulty  Advised to call if fevers/chills, erythema, induration, drainage, or persistent bleeding.  Images permanently stored and available for review in PACS.  Impression: Technically successful ultrasound guided injection.  Independent interpretation of notes and tests performed by another provider:   None.  Brief History, Exam, Impression, and Recommendations:    Primary localized osteoarthrosis of hands, bilateral This pleasant 66 year old female returns, she has bilateral hand osteoarthritis, right worse than left, she does have some finger triggering as well, today her pain is predominantly second PIP as well as first [place] on the right, these 2 structures were injected today. She is going to try celecoxib.  Primary osteoarthritis of right foot Recurrence of pain, repeat right fourth metatarsal/cuboid articulation today, last in April, return to see me as needed.    ____________________________________________ Gwen Her. Dianah Field, M.D., ABFM., CAQSM., AME. Primary Care and Sports Medicine [facility]  Adjunct Professor of [employer] [facility] [employer] of Medicine  Risk manager

## 2022-03-02 ENCOUNTER — Other Ambulatory Visit: Payer: Self-pay | Admitting: Physician Assistant

## 2022-03-02 DIAGNOSIS — R1013 Epigastric pain: Secondary | ICD-10-CM

## 2022-03-02 DIAGNOSIS — R079 Chest pain, unspecified: Secondary | ICD-10-CM

## 2022-03-05 DIAGNOSIS — Z23 Encounter for immunization: Secondary | ICD-10-CM | POA: Diagnosis not present

## 2022-03-13 ENCOUNTER — Other Ambulatory Visit: Payer: Self-pay | Admitting: Physician Assistant

## 2022-03-13 DIAGNOSIS — J310 Chronic rhinitis: Secondary | ICD-10-CM

## 2022-03-16 ENCOUNTER — Ambulatory Visit: Payer: Medicare Other | Admitting: Physician Assistant

## 2022-05-09 DIAGNOSIS — H2513 Age-related nuclear cataract, bilateral: Secondary | ICD-10-CM | POA: Diagnosis not present

## 2022-05-18 ENCOUNTER — Ambulatory Visit (INDEPENDENT_AMBULATORY_CARE_PROVIDER_SITE_OTHER): Payer: Medicare Other | Admitting: Physician Assistant

## 2022-05-18 DIAGNOSIS — Z1212 Encounter for screening for malignant neoplasm of rectum: Secondary | ICD-10-CM | POA: Diagnosis not present

## 2022-05-18 DIAGNOSIS — Z0000 Encounter for general adult medical examination without abnormal findings: Secondary | ICD-10-CM | POA: Diagnosis not present

## 2022-05-18 DIAGNOSIS — Z1231 Encounter for screening mammogram for malignant neoplasm of breast: Secondary | ICD-10-CM | POA: Diagnosis not present

## 2022-05-18 DIAGNOSIS — Z1211 Encounter for screening for malignant neoplasm of colon: Secondary | ICD-10-CM | POA: Diagnosis not present

## 2022-05-18 NOTE — Patient Instructions (Addendum)
[facility] Maintenance Summary and Written Plan of Care  Ms. Misty [name] ,  Thank you for allowing me to perform your Medicare Annual Wellness Visit and for your ongoing commitment to your health.   Health Maintenance & Immunization History Health Maintenance  Topic Date Due   COVID-19 Vaccine (5 - 2023-24 season) 06/03/2022 (Originally 01/28/2022)   COLONOSCOPY (Pts 45-94yr Insurance coverage will need to be confirmed)  05/19/2023 (Originally 04/29/2021)   MAMMOGRAM  08/06/2022   Medicare Annual Wellness (AWV)  05/19/2023   DTaP/Tdap/Td (3 - Td or Tdap) 11/02/2031   Pneumonia Vaccine 67 Years old  Completed   INFLUENZA VACCINE  Completed   DEXA SCAN  Completed   Hepatitis C Screening  Completed   Zoster Vaccines- Shingrix  Completed   HPV VACCINES  Aged Out   Immunization History  Administered Date(s) Administered   Fluad Quad(high Dose 65+) 03/23/2020, 02/16/2021, 03/05/2022   Influenza,inj,Quad PF,6+ Mos 03/06/2014, 03/02/2017, 01/31/2018, 01/24/2019   Influenza-Unspecified 04/28/2016   PFIZER(Purple Top)SARS-COV-2 Vaccination 08/09/2019, 08/30/2019, 04/09/2020   Pfizer Covid-19 Vaccine Bivalent Booster 164yr& up 03/11/2021   Pneumococcal Conjugate-13 07/13/2020   Pneumococcal Polysaccharide-23 06/23/2021   Tdap 07/08/2010, 11/01/2021   Zoster Recombinat (Shingrix) 01/26/2022, 04/14/2022   Zoster, Live 04/18/2011    These are the patient goals that we discussed:  Goals Addressed               This Visit's Progress     Patient Stated (pt-stated)        Patient stated that she would like to continue to stay healthy and keep walking.         This is a list of Health Maintenance Items that are overdue or due now: Screening mammography Colorectal cancer screeningThere are no preventive care reminders to display for this patient.    Orders/Referrals Placed Today: Orders Placed This Encounter  Procedures   Mammogram 3D SCREEN BREAST  BILATERAL    Standing Status:   Future    Standing Expiration Date:   05/19/2023    Scheduling Instructions:     Please call patient to schedule an appointment for sometime in march. She would like to wait until then.    Order Specific Question:   Reason for Exam (SYMPTOM  OR DIAGNOSIS REQUIRED)    Answer:   Breast cancer screening    Order Specific Question:   Preferred imaging location?    Answer:   [facility]   Cologuard   (Contact our referral department at [phone] you have not spoken with someone about your referral appointment within the next 5 days)    Follow-up Plan Follow-up with BrDonella StadePA-C as planned Medicare wellness visit in one year.  Patient will acces AVS on my chart.      Health Maintenance, Female Adopting a healthy lifestyle and getting preventive care are important in promoting health and wellness. Ask your health care provider about: The right schedule for you to have regular tests and exams. Things you can do on your own to prevent diseases and keep yourself healthy. What should I know about diet, weight, and exercise? Eat a healthy diet  Eat a diet that includes plenty of vegetables, fruits, low-fat dairy products, and lean protein. Do not eat a lot of foods that are high in solid fats, added sugars, or sodium. Maintain a healthy weight Body mass index (BMI) is used to identify weight problems. It estimates body fat based on height and weight. Your health  care provider can help determine your BMI and help you achieve or maintain a healthy weight. Get regular exercise Get regular exercise. This is one of the most important things you can do for your health. Most adults should: Exercise for at least 150 minutes each week. The exercise should increase your heart rate and make you sweat (moderate-intensity exercise). Do strengthening exercises at least twice a week. This is in addition to the moderate-intensity exercise. Spend  less time sitting. Even light physical activity can be beneficial. Watch cholesterol and blood lipids Have your blood tested for lipids and cholesterol at 67 years of age, then have this test every 5 years. Have your cholesterol levels checked more often if: Your lipid or cholesterol levels are high. You are older than 67 years of age. You are at high risk for heart disease. What should I know about cancer screening? Depending on your health history and family history, you may need to have cancer screening at various ages. This may include screening for: Breast cancer. Cervical cancer. Colorectal cancer. Skin cancer. Lung cancer. What should I know about heart disease, diabetes, and high blood pressure? Blood pressure and heart disease High blood pressure causes heart disease and increases the risk of stroke. This is more likely to develop in people who have high blood pressure readings or are overweight. Have your blood pressure checked: Every 3-5 years if you are 91-37 years of age. Every year if you are 48 years old or older. Diabetes Have regular diabetes screenings. This checks your fasting blood sugar level. Have the screening done: Once every three years after age 26 if you are at a normal weight and have a low risk for diabetes. More often and at a younger age if you are overweight or have a high risk for diabetes. What should I know about preventing infection? Hepatitis B If you have a higher risk for hepatitis B, you should be screened for this virus. Talk with your health care provider to find out if you are at risk for hepatitis B infection. Hepatitis C Testing is recommended for: Everyone born from [birth year] through 1966. Anyone with known risk factors for hepatitis C. Sexually transmitted infections (STIs) Get screened for STIs, including gonorrhea and chlamydia, if: You are sexually active and are younger than 67 years of age. You are older than 67 years of age and your  health care provider tells you that you are at risk for this type of infection. Your sexual activity has changed since you were last screened, and you are at increased risk for chlamydia or gonorrhea. Ask your health care provider if you are at risk. Ask your health care provider about whether you are at high risk for HIV. Your health care provider may recommend a prescription medicine to help prevent HIV infection. If you choose to take medicine to prevent HIV, you should first get tested for HIV. You should then be tested every 3 months for as long as you are taking the medicine. Pregnancy If you are about to stop having your period (premenopausal) and you may become pregnant, seek counseling before you get pregnant. Take 400 to 800 micrograms (mcg) of folic acid every day if you become pregnant. Ask for birth control (contraception) if you want to prevent pregnancy. Osteoporosis and menopause Osteoporosis is a disease in which the bones lose minerals and strength with aging. This can result in bone fractures. If you are 34 years old or older, or if you are at risk  for osteoporosis and fractures, ask your health care provider if you should: Be screened for bone loss. Take a calcium or vitamin D supplement to lower your risk of fractures. Be given hormone replacement therapy (HRT) to treat symptoms of menopause. Follow these instructions at home: Alcohol use Do not drink alcohol if: Your health care provider tells you not to drink. You are pregnant, may be pregnant, or are planning to become pregnant. If you drink alcohol: Limit how much you have to: 0-1 drink a day. Know how much alcohol is in your drink. In the [place] one drink equals one 12 oz bottle of beer (355 mL), one 5 oz glass of wine (148 mL), or one 1 oz glass of hard liquor (44 mL). Lifestyle Do not use any products that contain nicotine or tobacco. These products include cigarettes, chewing tobacco, and vaping devices, such as  e-cigarettes. If you need help quitting, ask your health care provider. Do not use street drugs. Do not share needles. Ask your health care provider for help if you need support or information about quitting drugs. General instructions Schedule regular health, dental, and eye exams. Stay current with your vaccines. Tell your health care provider if: You often feel depressed. You have ever been abused or do not feel safe at home. Summary Adopting a healthy lifestyle and getting preventive care are important in promoting health and wellness. Follow your health care provider's instructions about healthy diet, exercising, and getting tested or screened for diseases. Follow your health care provider's instructions on monitoring your cholesterol and blood pressure. This information is not intended to replace advice given to you by your health care provider. Make sure you discuss any questions you have with your health care provider. Document Revised: 10/05/2020 Document Reviewed: 10/05/2020 Elsevier Patient Education  [employer].

## 2022-05-18 NOTE — Progress Notes (Signed)
MEDICARE ANNUAL WELLNESS VISIT  05/18/2022  Telephone Visit Disclaimer This Medicare AWV was conducted by telephone due to national recommendations for restrictions regarding the COVID-19 Pandemic (e.g. social distancing).  I verified, using two identifiers, that I am speaking with Misty [name] or their authorized healthcare agent. I discussed the limitations, risks, security, and privacy concerns of performing an evaluation and management service by telephone and the potential availability of an in-person appointment in the future. The patient expressed understanding and agreed to proceed.  Location of Patient: Home with ASL Interpreter ID # [id number]  Location of Provider (nurse):  Provider Home  Subjective:    Misty [name] is a 66 y.o. female patient of Breeback, Royetta Car, PA-C who had a [employer] Visit today via telephone. Misty [name] is Retired and lives with their family. she has 3 children. she reports that she is socially active and does interact with friends/family regularly. she is moderately physically active and enjoys sewing and digital puzzles.  Patient Care Team: Lavada Mesi as PCP - General (Family Medicine)     05/18/2022   11:05 AM 05/17/2021    9:11 AM 10/01/2020    8:11 AM 08/19/2020    7:32 PM 08/15/2020   12:54 AM 08/14/2020    5:51 AM 08/06/2020   10:27 AM  Advanced Directives  Does Patient Have a Medical Advance Directive? Yes Yes Yes Yes Yes Yes Yes  Type of Advance Directive Living will Living will;Healthcare Power of [place];Living will  Living will;Healthcare Power of Attorney Living will;Healthcare Power of Attorney Living will;Healthcare Power of Attorney  Does patient want to make changes to medical advance directive? No - Patient declined No - Patient declined No - Patient declined  No - Patient declined    Copy of [place] in Chart?  No - copy requested No - copy requested  No - copy  requested No - copy requested     Hospital Utilization Over the Past 12 Months: # of hospitalizations or ER visits: 0 # of surgeries: 0  Review of Systems    Patient reports that her overall health is unchanged compared to last year.  History obtained from chart review and the patient and ASL Interpreter ID # [id number]   Patient Reported Readings (BP, Pulse, CBG, Weight, etc) none  Pain Assessment Pain : 0-10 Pain Score: 5  Pain Type: Acute pain Pain Location: Hand Pain Orientation: Right Pain Descriptors / Indicators: Constant Pain Onset: In the past 7 days Pain Frequency: Intermittent Pain Relieving Factors: Injection  Pain Relieving Factors: Injection  Current Medications & Allergies (verified) Allergies as of 05/18/2022       Reactions   Monosodium Glutamate    headache        Medication List        Accurate as of May 18, 2022 11:20 AM. If you have any questions, ask your nurse or doctor.          acetaminophen 500 MG tablet Commonly known as: TYLENOL Take 1,000 mg by mouth every 6 (six) hours as needed for moderate pain or headache.   albuterol 108 (90 Base) MCG/ACT inhaler Commonly known as: ProAir HFA TAKE 2 PUFFS BY MOUTH EVERY 6 HOURS AS NEEDED FOR WHEEZE OR SHORTNESS OF BREATH What changed:  how much to take how to take this when to take this reasons to take this   AMBULATORY NON FORMULARY MEDICATION Continuous positive airway pressure (CPAP) machine auto-titrate  from 4-20 cm of H2O pressure, with all supplemental supplies as needed.   Biotin 10 MG Caps Take by mouth.   calcium carbonate 500 MG chewable tablet Commonly known as: TUMS - dosed in mg elemental calcium Chew 1,000-1,500 mg by mouth daily as needed for indigestion or heartburn.   celecoxib 200 MG capsule Commonly known as: CeleBREX One to 2 tablets by mouth daily as needed for pain.   diclofenac Sodium 1 % Gel Commonly known as: VOLTAREN Apply 1 application topically 4  (four) times daily as needed (pain).   fluticasone 50 MCG/ACT nasal spray Commonly known as: FLONASE PLACE 2 SPRAYS INTO BOTH NOSTRILS DAILY AS NEEDED FOR ALLERGIES.   GAS-X PO Take 2 tablets by mouth daily as needed (gas).   loratadine 10 MG tablet Commonly known as: CLARITIN Take 10 mg by mouth daily.   magnesium oxide 400 MG tablet Commonly known as: MAG-OX Take 400 mg by mouth at bedtime.   MIRALAX PO Take by mouth as needed.   omeprazole 40 MG capsule Commonly known as: PRILOSEC TAKE 1 CAPSULE (40 MG TOTAL) BY MOUTH DAILY.   PROBIOTIC PO Take 1 capsule by mouth daily.   sennosides-docusate sodium 8.6-50 MG tablet Commonly known as: SENOKOT-S Take 2 tablets by mouth every 14 (fourteen) days.   simvastatin 40 MG tablet Commonly known as: ZOCOR Take 1 tablet (40 mg total) by mouth daily at 6 PM.   traMADol 50 MG tablet Commonly known as: ULTRAM Take 1 tablet (50 mg total) by mouth every 8 (eight) hours as needed for moderate pain.   triazolam 0.25 MG tablet Commonly known as: HALCION 1-2 tabs PO 2 hours before procedure or imaging.  Do not drive with this medication.   TURMERIC PO Take 1 capsule by mouth 2 (two) times daily.        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    per pt at age 29 months red measles with high fever;   needs american sign language interperter   Essential hypertension, benign 12/16/2013   Essential hypertension, benign 12/16/2013   Headache    History of kidney stones    Hyperlipidemia    Mild intermittent asthma    followed by pcp  (previously seen by dr Lake Bells, pulmonology)   OA (osteoarthritis)    OSA on CPAP    per pt uses nightly,  moderate osa per study in epic 12-27-2016   Osteopenia determined by x-ray 12/07/2016   DEXA scan July 2018   PONV (postoperative nausea  and vomiting)    Pre-diabetes    Screening for breast cancer 07/25/2016   Screening for breast cancer 07/25/2016   Self-catheterizes urinary bladder    psot op sling procedure on 08-14-2020   Sleep apnea    Urinary retention    post op sling procedure on 08-14-2020, self cathing   Wears glasses    Past Surgical History:  Procedure Laterality Date   ABDOMINAL HYSTERECTOMY  08/14/2020   bilateral foot surgery      left hand surgery     for arthritis    PUBOVAGINAL SLING N/A 08/14/2020   Procedure: Gaynelle Arabian;  Surgeon: Ardis Hughs, MD;  Location: [facility];  Service: Urology;  Laterality: N/A;   PUBOVAGINAL SLING N/A 10/01/2020   Procedure: URETHRAL LYSIS URETHRAL SLING  TAKE DOWN with CYSTOSCOPY;  Surgeon: Ardis Hughs, MD;  Location[facility];  Service: Urology;  Laterality: N/A;   ROBOTIC ASSISTED LAPAROSCOPIC HYSTERECTOMY AND SALPINGECTOMY Bilateral 08/14/2020   Procedure: XI ROBOTIC SUPRACERVOCAL HYSTERECTOMY AND BILATERAL SALPINGO OOPHORECTOMY;  Surgeon: Ardis Hughs, MD;  Location: [facility];  Service: Urology;  Laterality: Bilateral;   ROBOTIC ASSISTED LAPAROSCOPIC SACROCOLPOPEXY N/A 08/14/2020   Procedure: XI ROBOTIC  SACROCOLPOPEXY WITH CYSTOSCOPY;  Surgeon: Ardis Hughs, MD;  Location: [facility];  Service: Urology;  Laterality: N/A;   Family History  Problem Relation Age of Onset   Hyperlipidemia Mother    Cancer Father        prostate   Diabetes Father    Arthritis Father    Cancer Maternal Grandfather        colon cancer   Stroke Maternal Grandfather    Breast cancer Sister    Cancer Maternal Grandmother    Cancer Paternal Grandfather    Social History   Socioeconomic History   Marital status: Married    Spouse name: Haruka Kowaleski   Number of children: 3   Years of education: 14   Highest education level: Some college, no degree  Occupational History   Occupation: Retired.  Tobacco Use   Smoking status: Never    Smokeless tobacco: Never  Vaping Use   Vaping Use: Never used  Substance and Sexual Activity   Alcohol use: Never   Drug use: Never   Sexual activity: Not Currently    Birth control/protection: Post-menopausal  Other Topics Concern   Not on file  Social History Narrative   Lives with her husband, son and daughter-in-law. . She has three children. She enjoys sewing and digital puzzles.   Social Determinants of Health   Financial Resource Strain: Low Risk  (05/17/2022)   Overall Financial Resource Strain (CARDIA)    Difficulty of Paying Living Expenses: Not hard at all  Food Insecurity: No Food Insecurity (05/17/2022)   Hunger Vital Sign    Worried About Running Out of Food in the Last Year: Never true    Ran Out of Food in the Last Year: Never true  Transportation Needs: No Transportation Needs (05/17/2022)   PRAPARE - Hydrologist (Medical): No    Lack of Transportation (Non-Medical): No  Physical Activity: Sufficiently Active (05/17/2022)   Exercise Vital Sign    Days of Exercise per Week: 6 days    Minutes of Exercise per Session: 40 min  Stress: No Stress Concern Present (05/17/2022)   [place]    Feeling of Stress : Only a little  Social Connections: Moderately Integrated (05/18/2022)   Social Connection and Isolation Panel [NHANES]    Frequency of Communication with Friends and Family: Three times a week    Frequency of Social Gatherings with Friends and Family: Once a week    Attends Religious Services: More than 4 times per year    Active Member of [employer] or Organizations: No    Attends Archivist Meetings: Never    Marital Status: Married    Activities of Daily Living    05/17/2022   11:20 AM  In your present state of health, do you have any difficulty performing the following activities:  Hearing? 1  Vision? 0  Difficulty concentrating or making  decisions? 0  Walking or climbing stairs? 0  Dressing or bathing? 0  Doing errands, shopping? 0  Preparing Food and eating ? N  Using the Toilet? N  In the past six months, have you accidently leaked urine? N  Do you have problems with loss of bowel control? N  Managing your Medications? N  Managing your Finances? N  Housekeeping or managing your Housekeeping? N    Patient Education/ Literacy How often do you need to have someone help you when you read instructions, pamphlets, or other written materials from your doctor or pharmacy?: 1 - Never What is the last grade level you completed in school?: 2 years of college  Exercise Current Exercise Habits: Home exercise routine, Type of exercise: walking, Time (Minutes): 40, Frequency (Times/Week): 6, Weekly Exercise (Minutes/Week): 240, Intensity: Moderate, Exercise limited by: None identified  Diet Patient reports consuming 3 meals a day and 0-1 snack(s) a day Patient reports that her primary diet is: Regular Patient reports that she does have regular access to food.   Depression Screen    05/18/2022   11:05 AM 01/14/2022    2:39 PM 05/17/2021    9:12 AM 12/21/2020    1:40 PM 07/13/2020    2:54 PM 02/11/2019    2:27 PM 01/31/2018    8:39 AM  PHQ 2/9 Scores  PHQ - 2 Score 0 2 0 0 '2 4 2  '$ PHQ- 9 Score  '5   6 6 3     '$ Fall Risk    05/18/2022   11:05 AM 05/17/2022   11:20 AM 01/14/2022    2:39 PM 05/17/2021    9:11 AM 05/14/2021    8:57 AM  Fall Risk   Falls in the past year? 0 0 0 0 0  Number falls in past yr: 0  0 0   Injury with Fall? 0  0 0   Risk for fall due to : No Fall Risks  No Fall Risks No Fall Risks   Follow up Falls evaluation completed  Falls evaluation completed Falls evaluation completed      Objective:  Ismelda Weatherman seemed alert and oriented and she participated appropriately during our telephone visit.  Blood Pressure Weight BMI  BP Readings from Last 3 Encounters:  01/14/22 (!) 142/84  12/17/21 126/70   06/28/21 (!) 142/78   Wt Readings from Last 3 Encounters:  01/14/22 159 lb (72.1 kg)  12/17/21 158 lb (71.7 kg)  06/28/21 160 lb (72.6 kg)   BMI Readings from Last 1 Encounters:  01/14/22 27.29 kg/m    *Unable to obtain current vital signs, weight, and BMI due to telephone visit type  Hearing/Vision  Annalynne did not seem to have difficulty with hearing/understanding during the telephone conversation Reports that she has not had a formal eye exam by an eye care professional within the past year Reports that she has not had a formal hearing evaluation within the past year *Unable to fully assess hearing and vision during telephone visit type  Cognitive Function:    05/18/2022   11:09 AM 05/17/2021    9:24 AM  6CIT Screen  What Year? 0 points 0 points  What month? 0 points 0 points  What time? 0 points 0 points  Count back from 20 0 points 0 points  Months in reverse 0 points 0 points  Repeat phrase 0 points 0 points  Total Score 0 points 0 points   (Normal:0-7, Significant for Dysfunction: >8)  Normal Cognitive Function Screening: Yes   Immunization & Health Maintenance Record Immunization History  Administered Date(s) Administered   Fluad Quad(high Dose  65+) 03/23/2020, 02/16/2021, 03/05/2022   Influenza,inj,Quad PF,6+ Mos 03/06/2014, 03/02/2017, 01/31/2018, 01/24/2019   Influenza-Unspecified 04/28/2016   PFIZER(Purple Top)SARS-COV-2 Vaccination 08/09/2019, 08/30/2019, 04/09/2020   Pfizer Covid-19 Vaccine Bivalent Booster 60yr & up 03/11/2021   Pneumococcal Conjugate-13 07/13/2020   Pneumococcal Polysaccharide-23 06/23/2021   Tdap 07/08/2010, 11/01/2021   Zoster Recombinat (Shingrix) 01/26/2022, 04/14/2022   Zoster, Live 04/18/2011    Health Maintenance  Topic Date Due   COVID-19 Vaccine (5 - 2023-24 season) 06/03/2022 (Originally 01/28/2022)   COLONOSCOPY (Pts 45-46yrInsurance coverage will need to be confirmed)  05/19/2023 (Originally 04/29/2021)   MAMMOGRAM   08/06/2022   Medicare Annual Wellness (AWV)  05/19/2023   DTaP/Tdap/Td (3 - Td or Tdap) 11/02/2031   Pneumonia Vaccine 6525Years old  Completed   INFLUENZA VACCINE  Completed   DEXA SCAN  Completed   Hepatitis C Screening  Completed   Zoster Vaccines- Shingrix  Completed   HPV VACCINES  Aged Out       Assessment  This is a routine wellness examination for [employer] Health Maintenance: Due or Overdue There are no preventive care reminders to display for this patient.   KaFeliz Beamoes not need a referral for Community Assistance: Care Management:   no Social Work:    no Prescription Assistance:  no Nutrition/Diabetes Education:  no   Plan:  Personalized Goals  Goals Addressed               This Visit's Progress     Patient Stated (pt-stated)        Patient stated that she would like to continue to stay healthy and keep walking.       Personalized Health Maintenance & Screening Recommendations  Screening mammography Colorectal cancer screening  Lung Cancer Screening Recommended: no (Low Dose CT Chest recommended if Age 67-80ears, 30 pack-year currently smoking OR have quit w/in past 15 years) Hepatitis C Screening recommended: no HIV Screening recommended: no  Advanced Directives: Written information was not prepared per patient's request.  Referrals & Orders Orders Placed This Encounter  Procedures   Mammogram 3D SCREEN BREAST BILATERAL   Cologuard    Follow-up Plan Follow-up with BrDonella StadePA-C as planned Medicare wellness visit in one year.  Patient will acces AVS on my chart.   I have personally reviewed and noted the following in the patient's chart:   Medical and social history Use of alcohol, tobacco or illicit drugs  Current medications and supplements Functional ability and status Nutritional status Physical activity Advanced directives List of other physicians Hospitalizations, surgeries, and ER visits in previous 12  months Vitals Screenings to include cognitive, depression, and falls Referrals and appointments  In addition, I have reviewed and discussed with KaFeliz Beamertain preventive protocols, quality metrics, and best practice recommendations. A written personalized care plan for preventive services as well as general preventive health recommendations is available and can be mailed to the patient at her request.      BaTinnie GensRN BSN  05/18/2022

## 2022-06-13 DIAGNOSIS — Z1211 Encounter for screening for malignant neoplasm of colon: Secondary | ICD-10-CM | POA: Diagnosis not present

## 2022-06-13 DIAGNOSIS — Z1212 Encounter for screening for malignant neoplasm of rectum: Secondary | ICD-10-CM | POA: Diagnosis not present

## 2022-06-22 ENCOUNTER — Other Ambulatory Visit: Payer: Self-pay | Admitting: Sports Medicine

## 2022-06-22 DIAGNOSIS — M19041 Primary osteoarthritis, right hand: Secondary | ICD-10-CM

## 2022-06-22 LAB — COLOGUARD: COLOGUARD: NEGATIVE

## 2022-06-24 NOTE — Progress Notes (Signed)
Negative cologuard. Follow up in 3 years.

## 2022-07-05 ENCOUNTER — Other Ambulatory Visit: Payer: Self-pay | Admitting: Physician Assistant

## 2022-07-05 DIAGNOSIS — E782 Mixed hyperlipidemia: Secondary | ICD-10-CM

## 2022-07-08 ENCOUNTER — Encounter: Payer: Self-pay | Admitting: Physician Assistant

## 2022-08-24 ENCOUNTER — Other Ambulatory Visit: Payer: Self-pay | Admitting: Physician Assistant

## 2022-08-24 DIAGNOSIS — R1013 Epigastric pain: Secondary | ICD-10-CM

## 2022-08-24 DIAGNOSIS — R079 Chest pain, unspecified: Secondary | ICD-10-CM

## 2022-09-07 ENCOUNTER — Encounter: Payer: Self-pay | Admitting: Sports Medicine

## 2022-09-07 DIAGNOSIS — M19041 Primary osteoarthritis, right hand: Secondary | ICD-10-CM

## 2022-09-08 MED ORDER — TRIAZOLAM 0.25 MG PO TABS: ORAL_TABLET | ORAL | 0 refills | Status: DC

## 2022-09-12 ENCOUNTER — Encounter: Payer: Self-pay | Admitting: *Deleted

## 2022-09-12 ENCOUNTER — Other Ambulatory Visit (INDEPENDENT_AMBULATORY_CARE_PROVIDER_SITE_OTHER): Payer: Medicare Other

## 2022-09-12 ENCOUNTER — Ambulatory Visit (INDEPENDENT_AMBULATORY_CARE_PROVIDER_SITE_OTHER): Payer: Medicare Other | Admitting: Sports Medicine

## 2022-09-12 DIAGNOSIS — M19042 Primary osteoarthritis, left hand: Secondary | ICD-10-CM

## 2022-09-12 DIAGNOSIS — M65321 Trigger finger, right index finger: Secondary | ICD-10-CM

## 2022-09-12 DIAGNOSIS — M19041 Primary osteoarthritis, right hand: Secondary | ICD-10-CM

## 2022-09-12 MED ORDER — TRIAMCINOLONE ACETONIDE 40 MG/ML IJ SUSP
40.0000 mg | Freq: Once | INTRAMUSCULAR | Status: AC
Start: 2022-09-12 — End: 2022-09-12
  Administered 2022-09-12: 40 mg via INTRAMUSCULAR

## 2022-09-12 NOTE — Progress Notes (Signed)
    Procedures performed today:    Procedure: Real-time Ultrasound Guided injection of the right third flexor tendon sheath Device: Samsung HS60  Verbal informed consent obtained.  Time-out conducted.  Noted no overlying erythema, induration, or other signs of local infection.  Skin prepped in a sterile fashion.  Local anesthesia: Topical Ethyl chloride.  With sterile technique and under real time ultrasound guidance: Noted flexor tendon sheath effusion, 1/2 cc lidocaine, 1/2 cc kenalog 40 injected easily. Completed without difficulty  Advised to call if fevers/chills, erythema, induration, drainage, or persistent bleeding.  Images permanently stored and available for review in PACS.  Impression: Technically successful ultrasound guided injection.  Procedure: Real-time Ultrasound Guided injection of the right second PIP Device: Samsung HS60  Verbal informed consent obtained.  Time-out conducted.  Noted no overlying erythema, induration, or other signs of local infection.  Skin prepped in a sterile fashion.  Local anesthesia: Topical Ethyl chloride.  With sterile technique and under real time ultrasound guidance: Arthritic joint noted, 1/2 cc lidocaine, 1/2 cc kenalog 40 injected easily.  Completed without difficulty  Advised to call if fevers/chills, erythema, induration, drainage, or persistent bleeding.  Images permanently stored and available for review in PACS.  Impression: Technically successful ultrasound guided injection.  Independent interpretation of notes and tests performed by another provider:   None.  Brief History, Exam, Impression, and Recommendations:    Trigger finger, right Increasing pain right third flexor tendon sheath, today we injected her right third flexor tendon sheath, this was last injected April 2023  Primary localized osteoarthrosis of hands, bilateral Known hand osteoarthritis, worst right second proximal to phalangeal joint, last injected  September 2023, repeat right second PIP injection today.    ____________________________________________ Ihor Austin. Benjamin Stain, M.D., ABFM., CAQSM., AME. Primary Care and Sports Medicine [facility]  Adjunct Professor of Family Medicine  [place] [employer] [facility] [employer]  Restaurant manager, fast food

## 2022-09-12 NOTE — Assessment & Plan Note (Signed)
Increasing pain right third flexor tendon sheath, today we injected her right third flexor tendon sheath, this was last injected April 2023

## 2022-09-12 NOTE — Assessment & Plan Note (Signed)
Known hand osteoarthritis, worst right second proximal to phalangeal joint, last injected September 2023, repeat right second PIP injection today.

## 2022-09-21 ENCOUNTER — Encounter: Payer: Self-pay | Admitting: Family Medicine

## 2022-09-21 ENCOUNTER — Ambulatory Visit (INDEPENDENT_AMBULATORY_CARE_PROVIDER_SITE_OTHER): Payer: Medicare Other | Admitting: Family Medicine

## 2022-09-21 VITALS — Ht 64.0 in | Wt 154.0 lb

## 2022-09-21 DIAGNOSIS — M19071 Primary osteoarthritis, right ankle and foot: Secondary | ICD-10-CM

## 2022-09-21 NOTE — Progress Notes (Signed)
Misty [name] - 67 y.o. female MRN [id number]  Date of birth: March 26, 1956  SUBJECTIVE:  Including CC & ROS.  No chief complaint on file.   Misty [name] is a 67 y.o. female that is presenting with acute on chronic right foot pain.  Pain occurs over the lateral forefoot.  It is worse with standing and walking.  She has done well with previous orthotics.    Review of Systems See HPI   HISTORY: Past Medical, Surgical, Social, and Family History Reviewed & Updated per EMR.   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    per pt at age 54 months red measles with high fever;   needs american sign language interperter   Essential hypertension, benign 12/16/2013   Essential hypertension, benign 12/16/2013   Headache    History of kidney stones    Hyperlipidemia    Mild intermittent asthma    followed by pcp  (previously seen by dr Kendrick Fries, pulmonology)   OA (osteoarthritis)    OSA on CPAP    per pt uses nightly,  moderate osa per study in epic 12-27-2016   Osteopenia determined by x-ray 12/07/2016   DEXA scan July 2018   PONV (postoperative nausea and vomiting)    Pre-diabetes    Screening for breast cancer 07/25/2016   Screening for breast cancer 07/25/2016   Self-catheterizes urinary bladder    psot op sling procedure on 08-14-2020   Sleep apnea    Urinary retention    post op sling procedure on 08-14-2020, self cathing   Wears glasses     Past Surgical History:  Procedure Laterality Date   ABDOMINAL HYSTERECTOMY  08/14/2020   bilateral foot surgery      left hand surgery     for arthritis    PUBOVAGINAL SLING N/A 08/14/2020   Procedure: Leonides Grills;  Surgeon: Crist Fat, MD;  Location: [facility];  Service: Urology;  Laterality: N/A;   PUBOVAGINAL SLING N/A 10/01/2020   Procedure: URETHRAL LYSIS  URETHRAL SLING TAKE DOWN with CYSTOSCOPY;  Surgeon: Crist Fat, MD;  Location: [facility];  Service: Urology;  Laterality: N/A;   ROBOTIC ASSISTED LAPAROSCOPIC HYSTERECTOMY AND SALPINGECTOMY Bilateral 08/14/2020   Procedure: XI ROBOTIC SUPRACERVOCAL HYSTERECTOMY AND BILATERAL SALPINGO OOPHORECTOMY;  Surgeon: Crist Fat, MD;  Location: [facility];  Service: Urology;  Laterality: Bilateral;   ROBOTIC ASSISTED LAPAROSCOPIC SACROCOLPOPEXY N/A 08/14/2020   Procedure: XI ROBOTIC  SACROCOLPOPEXY WITH CYSTOSCOPY;  Surgeon: Crist Fat, MD;  Location: [facility];  Service: Urology;  Laterality: N/A;     PHYSICAL EXAM:  VS: Ht  (1.626 m)   Wt 154 lb (69.9 kg)   BMI 26.43 kg/m  Physical Exam Gen: NAD, alert, cooperative with exam, well-appearing MSK:  Neurovascularly intact    Patient was fitted for a standard, cushioned, semi-rigid orthotic. The orthotic was heated and afterward the patient stood on the orthotic blank positioned on the orthotic stand. The patient was positioned in subtalar neutral position and 10 degrees of ankle dorsiflexion in a weight bearing stance. After completion of molding, a stable base was applied to the orthotic blank. The blank was ground to a stable position for weight bearing. Size: 7 Pairs: 2 Base: Blue EVA Additional Posting and Padding: None The patient ambulated these,  and they were very comfortable.    ASSESSMENT & PLAN:   Primary osteoarthritis of right foot Completed orthotics today

## 2022-09-21 NOTE — Assessment & Plan Note (Signed)
Completed orthotics today.  

## 2022-10-03 ENCOUNTER — Encounter: Payer: Self-pay | Admitting: Physician Assistant

## 2022-10-03 ENCOUNTER — Ambulatory Visit (INDEPENDENT_AMBULATORY_CARE_PROVIDER_SITE_OTHER): Payer: Medicare Other | Admitting: Physician Assistant

## 2022-10-03 VITALS — BP 138/86 | HR 75 | Ht 64.0 in | Wt 155.0 lb

## 2022-10-03 DIAGNOSIS — R7303 Prediabetes: Secondary | ICD-10-CM | POA: Diagnosis not present

## 2022-10-03 DIAGNOSIS — Z636 Dependent relative needing care at home: Secondary | ICD-10-CM | POA: Diagnosis not present

## 2022-10-03 DIAGNOSIS — R030 Elevated blood-pressure reading, without diagnosis of hypertension: Secondary | ICD-10-CM

## 2022-10-03 DIAGNOSIS — R5383 Other fatigue: Secondary | ICD-10-CM

## 2022-10-03 DIAGNOSIS — R300 Dysuria: Secondary | ICD-10-CM | POA: Diagnosis not present

## 2022-10-03 LAB — POCT GLYCOSYLATED HEMOGLOBIN (HGB A1C): Hemoglobin A1C: 6.2 % — AB (ref 4.0–5.6)

## 2022-10-03 LAB — POCT URINALYSIS DIP (CLINITEK)
Bilirubin, UA: NEGATIVE
Blood, UA: NEGATIVE
Glucose, UA: NEGATIVE mg/dL
Ketones, POC UA: NEGATIVE mg/dL
Leukocytes, UA: NEGATIVE
Nitrite, UA: NEGATIVE
POC PROTEIN,UA: NEGATIVE
Spec Grav, UA: 1.015 (ref 1.010–1.025)
Urobilinogen, UA: 0.2 E.U./dL
pH, UA: 7 (ref 5.0–8.0)

## 2022-10-03 LAB — POCT UA - MICROALBUMIN
Albumin/Creatinine Ratio, Urine, POC: <30
Creatinine, POC: 10 mg/dL
Microalbumin Ur, POC: 10 mg/L

## 2022-10-03 MED ORDER — BUPROPION HCL ER (XL) 150 MG PO TB24
150.0000 mg | ORAL_TABLET | Freq: Every morning | ORAL | 1 refills | Status: DC
Start: 2022-10-03 — End: 2022-10-25

## 2022-10-03 NOTE — Progress Notes (Signed)
Established Patient Office Visit  Subjective   Patient ID: Misty [name], female    DOB: 1956/04/30  Age: 67 y.o. MRN: [id number]  Chief Complaint  Patient presents with   Follow-up    HPI Pt is a 67 yo female who is hearing impaired. Interpreter used today.   Pt is having more fatigue and stress. She is taking care of her dying husband. She does not eat great. She does walk daily. She has pre-diabetes. She checks her sugars and most of the time it is good but she does have spikes.   Using CPAP nightly for at least 7 hours.    Pt had more frequent urination with some discomfort. Hx of UTI's. Would like checked today. No fever, chills, body aches, flank pain. Not tried anything to make better.    .. Active Ambulatory Problems    Diagnosis Date Noted   Essential hypertension, benign 12/16/2013   Hyperlipidemia 12/16/2013   Frequent headaches 12/16/2013   Hx of extrinsic asthma 12/16/2013   Pre-diabetes 12/27/2013   Polyp, cervix 09/09/2015   Midline low back pain without sciatica 09/09/2015   Trigger finger, right 05/17/2016   Gluteal tendonitis 07/25/2016   Screening for breast cancer 07/25/2016   Fatigue 12/05/2016   Osteopenia determined by x-ray 12/07/2016   OSA (obstructive sleep apnea) 01/02/2017   Dyspnea 04/13/2017   GERD (gastroesophageal reflux disease) 04/13/2017   Vaginal dryness, menopausal 01/31/2018   Female cystocele 02/12/2019   Constipation 02/12/2019   Female genital prolapse, unspecified 04/15/2020   Stress incontinence 04/15/2020   Primary localized osteoarthrosis of hands, bilateral 07/15/2020   Bilateral hand pain 07/21/2020   Chronic rhinitis 07/21/2020   Reactive airway disease 07/21/2020   Cystocele with prolapse 08/14/2020   Hypokalemia 09/01/2020   Urinary retention 09/01/2020   Status post hysterectomy 09/01/2020   Elevated blood pressure reading without diagnosis of hypertension 09/01/2020   Chest tightness 09/23/2020   Primary  osteoarthritis of right foot 11/24/2020   B12 deficiency 12/22/2020   Vitamin D deficiency 12/22/2020   Brittle nails 12/22/2020   Chronic foot pain, right 06/25/2021   Epigastric discomfort 01/14/2022   Chest pain 01/14/2022   Caregiver stress 10/03/2022   No energy 10/03/2022   Resolved Ambulatory Problems    Diagnosis Date Noted   Osteoarthritis of finger 12/13/2013   Finger pain, right 05/17/2016   Past Medical History:  Diagnosis Date   Allergic rhinitis, seasonal    Anxiety    Complication of anesthesia    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    Objective:     BP 138/86   Pulse 75   Ht 5\' 4"  (1.626 m)   Wt 155 lb (70.3 kg)   SpO2 99%   BMI 26.61 kg/m  BP Readings from Last 3 Encounters:  10/03/22 138/86  01/14/22 (!) 142/84  12/17/21 126/70   Wt Readings from Last 3 Encounters:  10/03/22 155 lb (70.3 kg)  09/21/22 154 lb (69.9 kg)  01/14/22 159 lb (72.1 kg)   .Marland Kitchen Results for orders placed or performed in visit on 10/03/22  POCT UA - Microalbumin  Result Value Ref Range  Microalbumin Ur, POC 10 mg/L   Creatinine, POC 10 mg/dL   Albumin/Creatinine Ratio, Urine, POC <30   POCT HgB A1C  Result Value Ref Range   Hemoglobin A1C 6.2 (A) 4.0 - 5.6 %   HbA1c POC (<> result, manual entry)     HbA1c, POC (prediabetic range)     HbA1c, POC (controlled diabetic range)    POCT URINALYSIS DIP (CLINITEK)  Result Value Ref Range   Color, UA yellow yellow   Clarity, UA clear clear   Glucose, UA negative negative mg/dL   Bilirubin, UA negative negative   Ketones, POC UA negative negative mg/dL   Spec Grav, UA 1.610 9.604 - 1.025   Blood, UA negative negative   pH, UA 7.0 5.0 - 8.0   POC PROTEIN,UA negative negative,  trace   Urobilinogen, UA 0.2 0.2 or 1.0 E.U./dL   Nitrite, UA Negative Negative   Leukocytes, UA Negative Negative     Physical Exam Constitutional:      Appearance: Normal appearance.  HENT:     Head: Normocephalic.  Cardiovascular:     Rate and Rhythm: Normal rate.  Pulmonary:     Effort: Pulmonary effort is normal.  Musculoskeletal:     Right lower leg: No edema.     Left lower leg: No edema.  Neurological:     General: No focal deficit present.     Mental Status: She is oriented to person, place, and time.  Psychiatric:        Mood and Affect: Mood normal.       The 10-year ASCVD risk score (Arnett DK, et al., 2019) is: 7.9%    Assessment & Plan:  Marland KitchenMarland KitchenEmiliah was seen today for follow-up.  Diagnoses and all orders for this visit:  Pre-diabetes -     POCT UA - Microalbumin -     POCT HgB A1C  Dysuria -     POCT URINALYSIS DIP (CLINITEK)  Caregiver stress -     buPROPion (WELLBUTRIN XL) 150 MG 24 hr tablet; Take 1 tablet (150 mg total) by mouth in the morning.  No energy -     buPROPion (WELLBUTRIN XL) 150 MG 24 hr tablet; Take 1 tablet (150 mg total) by mouth in the morning.   A1C still in pre-diabetes range Continue with exercise and good diet choices Consider metformin trial Will start keeping log of sugars and bring in in 6 weeks BP elevated today Decrease sodium Went down on 2nd recheck Recheck BP in 6 weeks  Caregiver stress/fatigue Continue using CPAP nightly Added wellbutrin Make sure to remember self care  UA negative for any signs of infection Reassurance given Ok to use pyridium OTC for a few days if needed Drink plenty of water   Return in about 6 weeks (around 11/14/2022).    Tandy Gaw, PA-C

## 2022-10-10 ENCOUNTER — Other Ambulatory Visit: Payer: Self-pay | Admitting: Sports Medicine

## 2022-10-10 ENCOUNTER — Encounter: Payer: Self-pay | Admitting: Sports Medicine

## 2022-10-10 DIAGNOSIS — M19041 Primary osteoarthritis, right hand: Secondary | ICD-10-CM

## 2022-10-25 ENCOUNTER — Other Ambulatory Visit: Payer: Self-pay | Admitting: Physician Assistant

## 2022-10-25 ENCOUNTER — Ambulatory Visit (INDEPENDENT_AMBULATORY_CARE_PROVIDER_SITE_OTHER): Payer: Medicare Other | Admitting: Sports Medicine

## 2022-10-25 DIAGNOSIS — R5383 Other fatigue: Secondary | ICD-10-CM

## 2022-10-25 DIAGNOSIS — Z636 Dependent relative needing care at home: Secondary | ICD-10-CM

## 2022-10-25 DIAGNOSIS — M1811 Unilateral primary osteoarthritis of first carpometacarpal joint, right hand: Secondary | ICD-10-CM | POA: Insufficient documentation

## 2022-10-25 NOTE — Assessment & Plan Note (Signed)
Trigger finger and finger osteoarthritis are better after the injections at the last visit, she does have some discomfort right thumb basal joint, we will start conservatively with a thumb loop and home physical therapy before considering injection. This was confirmed on x-rays.

## 2022-10-25 NOTE — Progress Notes (Signed)
    Procedures performed today:    None.  Independent interpretation of notes and tests performed by another provider:   None.  Brief History, Exam, Impression, and Recommendations:    Primary osteoarthritis of first carpometacarpal joint of right hand Trigger finger and finger osteoarthritis are better after the injections at the last visit, she does have some discomfort right thumb basal joint, we will start conservatively with a thumb loop and home physical therapy before considering injection. This was confirmed on x-rays.    ____________________________________________ Ihor Austin. Benjamin Stain, M.D., ABFM., CAQSM., AME. Primary Care and Sports Medicine [facility]  Adjunct Professor of Family Medicine  [place] [employer]  Restaurant manager, fast food

## 2022-11-14 ENCOUNTER — Encounter: Payer: Self-pay | Admitting: Physician Assistant

## 2022-11-14 ENCOUNTER — Ambulatory Visit (INDEPENDENT_AMBULATORY_CARE_PROVIDER_SITE_OTHER): Payer: Medicare Other | Admitting: Physician Assistant

## 2022-11-14 VITALS — BP 145/92 | HR 74 | Wt 155.0 lb

## 2022-11-14 DIAGNOSIS — R7303 Prediabetes: Secondary | ICD-10-CM | POA: Diagnosis not present

## 2022-11-14 DIAGNOSIS — I10 Essential (primary) hypertension: Secondary | ICD-10-CM

## 2022-11-14 MED ORDER — METFORMIN HCL 500 MG PO TABS
500.0000 mg | ORAL_TABLET | Freq: Two times a day (BID) | ORAL | 3 refills | Status: DC
Start: 2022-11-14 — End: 2023-01-11

## 2022-11-14 MED ORDER — LOSARTAN POTASSIUM 25 MG PO TABS
25.0000 mg | ORAL_TABLET | Freq: Every day | ORAL | 1 refills | Status: DC
Start: 2022-11-14 — End: 2022-12-08

## 2022-11-14 NOTE — Progress Notes (Signed)
Established Patient Office Visit  Subjective   Patient ID: Misty [name], female    DOB: 19-Mar-1956  Age: 67 y.o. MRN: [id number]  Chief Complaint  Patient presents with   Hypertension    HPI Patient is a 67 year old female with prediabetes who presents to the clinic for 6-week follow-up.  She has sign language interpretor with her today. She has been under a lot of stress recently with her husband needing care.  She admits she has not been eating like she should and not exercising.  She has been keeping a blood glucose log and her sugars range from 90-1 85 in the mornings.  She has other times where she might spike some throughout the day.  She denies any vision changes or chest pains.  She is having some headaches from time to time.  She is not checking her blood pressure.  Her blood pressure was elevated at last visit.   .. Active Ambulatory Problems    Diagnosis Date Noted   Essential hypertension, benign 12/16/2013   Hyperlipidemia 12/16/2013   Frequent headaches 12/16/2013   Hx of extrinsic asthma 12/16/2013   Pre-diabetes 12/27/2013   Polyp, cervix 09/09/2015   Midline low back pain without sciatica 09/09/2015   Trigger finger, right 05/17/2016   Gluteal tendonitis 07/25/2016   Screening for breast cancer 07/25/2016   Fatigue 12/05/2016   Osteopenia determined by x-ray 12/07/2016   OSA (obstructive sleep apnea) 01/02/2017   Dyspnea 04/13/2017   GERD (gastroesophageal reflux disease) 04/13/2017   Vaginal dryness, menopausal 01/31/2018   Female cystocele 02/12/2019   Constipation 02/12/2019   Female genital prolapse, unspecified 04/15/2020   Stress incontinence 04/15/2020   Primary localized osteoarthrosis of hands, bilateral 07/15/2020   Bilateral hand pain 07/21/2020   Chronic rhinitis 07/21/2020   Reactive airway disease 07/21/2020   Cystocele with prolapse 08/14/2020   Hypokalemia 09/01/2020   Urinary retention 09/01/2020   Status post hysterectomy 09/01/2020    Elevated blood pressure reading without diagnosis of hypertension 09/01/2020   Chest tightness 09/23/2020   Primary osteoarthritis of right foot 11/24/2020   B12 deficiency 12/22/2020   Vitamin D deficiency 12/22/2020   Brittle nails 12/22/2020   Chronic foot pain, right 06/25/2021   Epigastric discomfort 01/14/2022   Chest pain 01/14/2022   Caregiver stress 10/03/2022   No energy 10/03/2022   Primary osteoarthritis of first carpometacarpal joint of right hand 10/25/2022   Resolved Ambulatory Problems    Diagnosis Date Noted   Osteoarthritis of finger 12/13/2013   Finger pain, right 05/17/2016   Past Medical History:  Diagnosis Date   Allergic rhinitis, seasonal    Anxiety    Complication of anesthesia    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  Constitutional:      Appearance: Normal appearance.  HENT:     Head: Normocephalic.  Cardiovascular:     Rate and Rhythm: Normal rate and regular rhythm.     Pulses: Normal pulses.     Heart sounds: Normal heart sounds.  Musculoskeletal:     Cervical back: Normal range of motion and neck supple.     Right lower leg: No edema.     Left lower leg: No edema.  Neurological:     General: No focal deficit present.     Mental Status: She is alert and oriented to person, place, and time.  Psychiatric:        Mood and Affect: Mood normal.       The 10-year ASCVD risk score (Arnett DK, et al., 2019) is: 11.6%    Assessment & Plan:  Marland KitchenMarland KitchenKoula was seen today for hypertension.  Diagnoses and all orders for this visit:  Essential hypertension, benign -     losartan (COZAAR) 25 MG tablet; Take 1 tablet (25 mg total) by mouth daily.  Pre-diabetes -     metFORMIN (GLUCOPHAGE) 500 MG tablet; Take 1 tablet (500 mg total) by mouth 2 (two) times daily with a meal.   Based on her home glucose log I would like for her to start metformin 500 mg twice a day with meals.   Discussed side effects of medication.  BP not to goal, did decrease some on 2nd recheck  Added cozaar Discussed side effects Encouraged her to start checking BP and blood glucose in the morning Follow up in 4 weeks    Return in about 4 weeks (around 12/12/2022).    Tandy Gaw, PA-C

## 2022-12-06 ENCOUNTER — Ambulatory Visit: Payer: Medicare Other | Admitting: Sports Medicine

## 2022-12-06 ENCOUNTER — Other Ambulatory Visit: Payer: Self-pay | Admitting: Physician Assistant

## 2022-12-06 DIAGNOSIS — I10 Essential (primary) hypertension: Secondary | ICD-10-CM

## 2022-12-12 ENCOUNTER — Encounter: Payer: Self-pay | Admitting: Physician Assistant

## 2022-12-12 ENCOUNTER — Ambulatory Visit (INDEPENDENT_AMBULATORY_CARE_PROVIDER_SITE_OTHER): Payer: Medicare Other | Admitting: Physician Assistant

## 2022-12-12 VITALS — BP 137/68 | HR 89 | Resp 16 | Ht 64.0 in | Wt 156.1 lb

## 2022-12-12 DIAGNOSIS — R7309 Other abnormal glucose: Secondary | ICD-10-CM | POA: Diagnosis not present

## 2022-12-12 DIAGNOSIS — I10 Essential (primary) hypertension: Secondary | ICD-10-CM

## 2022-12-12 DIAGNOSIS — R7303 Prediabetes: Secondary | ICD-10-CM | POA: Diagnosis not present

## 2022-12-12 NOTE — Progress Notes (Signed)
Established Patient Office Visit  Subjective   Patient ID: Misty [name], female    DOB: 12-07-1955  Age: 67 y.o. MRN: [id number]  Chief Complaint  Patient presents with   Follow-up   Hypertension    Discuss medications    HPI Pt is a 67 yo female who is deaf and accompanied by interpreter to follow up on HTN and Pre-diabetes.   Pt is doing well. She is checking BP at home and ranging 110-135 over 70-80s. She is taking cozaar without any problems. Denies any CP, palpitations, headaches or vision changes.   She is checking her sugars in morning and have decreased from 130-140s to 100-120s. She had some GI upset first week with metformin but has been tolerating ever since. She is moving more and trying to keep healthy low carb diet.   Active Ambulatory Problems    Diagnosis Date Noted   Essential hypertension, benign 12/16/2013   Hyperlipidemia 12/16/2013   Frequent headaches 12/16/2013   Hx of extrinsic asthma 12/16/2013   Pre-diabetes 12/27/2013   Polyp, cervix 09/09/2015   Midline low back pain without sciatica 09/09/2015   Trigger finger, right 05/17/2016   Gluteal tendonitis 07/25/2016   Screening for breast cancer 07/25/2016   Fatigue 12/05/2016   Osteopenia determined by x-ray 12/07/2016   OSA (obstructive sleep apnea) 01/02/2017   Dyspnea 04/13/2017   GERD (gastroesophageal reflux disease) 04/13/2017   Vaginal dryness, menopausal 01/31/2018   Female cystocele 02/12/2019   Constipation 02/12/2019   Female genital prolapse, unspecified 04/15/2020   Stress incontinence 04/15/2020   Primary localized osteoarthrosis of hands, bilateral 07/15/2020   Bilateral hand pain 07/21/2020   Chronic rhinitis 07/21/2020   Reactive airway disease 07/21/2020   Cystocele with prolapse 08/14/2020   Hypokalemia 09/01/2020   Urinary retention 09/01/2020   Status post hysterectomy 09/01/2020   Elevated blood pressure reading without diagnosis of hypertension 09/01/2020   Chest  tightness 09/23/2020   Primary osteoarthritis of right foot 11/24/2020   B12 deficiency 12/22/2020   Vitamin D deficiency 12/22/2020   Brittle nails 12/22/2020   Chronic foot pain, right 06/25/2021   Epigastric discomfort 01/14/2022   Chest pain 01/14/2022   Caregiver stress 10/03/2022   No energy 10/03/2022   Primary osteoarthritis of first carpometacarpal joint of right hand 10/25/2022   Resolved Ambulatory Problems    Diagnosis Date Noted   Osteoarthritis of finger 12/13/2013   Finger pain, right 05/17/2016   Past Medical History:  Diagnosis Date   Allergic rhinitis, seasonal    Anxiety    Complication of anesthesia    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    Objective:     BP 137/68 (BP Location: Right Arm, Patient Position: Sitting, Cuff Size: Normal)   Pulse 89   Resp 16   Ht 5\' 4"  (1.626 m)   Wt 156 lb 1.9 oz (70.8 kg)   SpO2 94%   BMI 26.80 kg/m  BP Readings from Last 3 Encounters:  12/12/22 137/68  11/14/22 (!) 145/92  10/03/22 138/86   Wt Readings from Last 3 Encounters:  12/12/22 156 lb 1.9 oz (70.8 kg)  11/14/22 155 lb (  70.3 kg)  10/03/22 155 lb (70.3 kg)      Physical Exam Constitutional:      Appearance: Normal appearance.  Cardiovascular:     Rate and Rhythm: Normal rate and regular rhythm.  Pulmonary:     Effort: Pulmonary effort is normal.  Neurological:     General: No focal deficit present.     Mental Status: She is alert and oriented to person, place, and time.  Psychiatric:        Mood and Affect: Mood normal.      The 10-year ASCVD risk score (Arnett DK, et al., 2019) is: 11.5%    Assessment & Plan:  Marland KitchenMarland KitchenVincie was seen today for follow-up and hypertension.  Diagnoses and all orders  for this visit:  Essential hypertension, benign -     COMPLETE METABOLIC PANEL WITH GFR  Pre-diabetes -     COMPLETE METABOLIC PANEL WITH GFR   BP improved and looks good-continue cozaar Fasting glucose has improved and closer to goal of 100.  Continue metformin bid Will check labs in 4 months Reassured patient about safety of medication Cmp ordered to check kidney and liver   Return in about 4 months (around 04/14/2023) for pre-diabetes/HTN.    Tandy Gaw, PA-C

## 2022-12-12 NOTE — Patient Instructions (Signed)
Continue on cozaar once a day, metformin twice a day with food

## 2022-12-13 LAB — COMPLETE METABOLIC PANEL WITHOUT GFR
AG Ratio: 2.8 (calc) — ABNORMAL HIGH (ref 1.0–2.5)
ALT: 28 U/L (ref 6–29)
AST: 18 U/L (ref 10–35)
Albumin: 4.8 g/dL (ref 3.6–5.1)
Alkaline phosphatase (APISO): 98 U/L (ref 37–153)
BUN: 17 mg/dL (ref 7–25)
CO2: 25 mmol/L (ref 20–32)
Calcium: 9.8 mg/dL (ref 8.6–10.4)
Chloride: 107 mmol/L (ref 98–110)
Creat: 0.52 mg/dL (ref 0.50–1.05)
Globulin: 1.7 g/dL — ABNORMAL LOW (ref 1.9–3.7)
Glucose, Bld: 131 mg/dL — ABNORMAL HIGH (ref 65–99)
Potassium: 3.7 mmol/L (ref 3.5–5.3)
Sodium: 141 mmol/L (ref 135–146)
Total Bilirubin: 0.4 mg/dL (ref 0.2–1.2)
Total Protein: 6.5 g/dL (ref 6.1–8.1)
eGFR: 101 mL/min/1.73m2 (ref >=60)

## 2022-12-13 LAB — HEMOGLOBIN A1C
Hgb A1c MFr Bld: 6.3 %{Hb} — ABNORMAL HIGH (ref <5.7)
Mean Plasma Glucose: 134 mg/dL
eAG (mmol/L): 7.4 mmol/L

## 2022-12-13 NOTE — Progress Notes (Signed)
Misty [name],   Kidney and liver function look great.  Protein a little low. Make sure eating enough in your diet.  A1C overall has not improved. I do think we need to increase metformin. If you were taking 500mg  twice a day then need to increase to 1000mg  twice a day. Recheck in 3 months.

## 2022-12-14 ENCOUNTER — Other Ambulatory Visit: Payer: Self-pay | Admitting: Physician Assistant

## 2022-12-14 DIAGNOSIS — R079 Chest pain, unspecified: Secondary | ICD-10-CM

## 2022-12-14 DIAGNOSIS — R1013 Epigastric pain: Secondary | ICD-10-CM

## 2022-12-28 ENCOUNTER — Ambulatory Visit: Payer: Medicare Other

## 2022-12-28 DIAGNOSIS — Z1231 Encounter for screening mammogram for malignant neoplasm of breast: Secondary | ICD-10-CM

## 2022-12-28 DIAGNOSIS — Z0000 Encounter for general adult medical examination without abnormal findings: Secondary | ICD-10-CM

## 2022-12-30 NOTE — Progress Notes (Signed)
Normal mammogram. Follow up in 1 year.

## 2022-12-31 ENCOUNTER — Other Ambulatory Visit: Payer: Self-pay | Admitting: Physician Assistant

## 2022-12-31 DIAGNOSIS — E782 Mixed hyperlipidemia: Secondary | ICD-10-CM

## 2023-01-04 ENCOUNTER — Other Ambulatory Visit: Payer: Self-pay | Admitting: Sports Medicine

## 2023-01-04 DIAGNOSIS — M19041 Primary osteoarthritis, right hand: Secondary | ICD-10-CM

## 2023-01-10 ENCOUNTER — Encounter: Payer: Self-pay | Admitting: Physician Assistant

## 2023-01-11 ENCOUNTER — Encounter: Payer: Self-pay | Admitting: Physician Assistant

## 2023-01-11 MED ORDER — METFORMIN HCL 1000 MG PO TABS: 1000.0000 mg | ORAL_TABLET | Freq: Two times a day (BID) | ORAL | 1 refills | Status: DC

## 2023-02-25 ENCOUNTER — Other Ambulatory Visit: Payer: Self-pay | Admitting: Physician Assistant

## 2023-02-25 DIAGNOSIS — R079 Chest pain, unspecified: Secondary | ICD-10-CM

## 2023-02-25 DIAGNOSIS — R1013 Epigastric pain: Secondary | ICD-10-CM

## 2023-02-26 ENCOUNTER — Encounter: Payer: Self-pay | Admitting: Physician Assistant

## 2023-02-26 DIAGNOSIS — R079 Chest pain, unspecified: Secondary | ICD-10-CM

## 2023-02-26 DIAGNOSIS — R1013 Epigastric pain: Secondary | ICD-10-CM

## 2023-02-27 MED ORDER — OMEPRAZOLE 40 MG PO CPDR
40.0000 mg | DELAYED_RELEASE_CAPSULE | Freq: Every day | ORAL | 0 refills | Status: DC
Start: 2023-02-27 — End: 2023-02-28

## 2023-02-27 NOTE — Telephone Encounter (Signed)
Omeprazole refilled per protocol  Patient also requesting rx rf of Tramadol. Tramadol not showing in pt current med list.  Shows  Discontinued by: Jomarie Longs, PA-C on 12/12/2022 15:03

## 2023-02-28 MED ORDER — OMEPRAZOLE 40 MG PO CPDR
40.0000 mg | DELAYED_RELEASE_CAPSULE | Freq: Every day | ORAL | 1 refills | Status: DC
Start: 2023-02-28 — End: 2023-08-24

## 2023-04-17 ENCOUNTER — Encounter: Payer: Self-pay | Admitting: Physician Assistant

## 2023-04-17 ENCOUNTER — Ambulatory Visit (INDEPENDENT_AMBULATORY_CARE_PROVIDER_SITE_OTHER): Payer: Medicare Other | Admitting: Physician Assistant

## 2023-04-17 VITALS — BP 144/80 | HR 82 | Ht 64.0 in | Wt 152.0 lb

## 2023-04-17 DIAGNOSIS — Z23 Encounter for immunization: Secondary | ICD-10-CM

## 2023-04-17 DIAGNOSIS — R7303 Prediabetes: Secondary | ICD-10-CM | POA: Diagnosis not present

## 2023-04-17 DIAGNOSIS — M19041 Primary osteoarthritis, right hand: Secondary | ICD-10-CM

## 2023-04-17 DIAGNOSIS — I10 Essential (primary) hypertension: Secondary | ICD-10-CM

## 2023-04-17 DIAGNOSIS — M19042 Primary osteoarthritis, left hand: Secondary | ICD-10-CM

## 2023-04-17 LAB — POCT GLYCOSYLATED HEMOGLOBIN (HGB A1C): Hemoglobin A1C: 5.6 % (ref 4.0–5.6)

## 2023-04-17 MED ORDER — LOSARTAN POTASSIUM 50 MG PO TABS: 50.0000 mg | ORAL_TABLET | Freq: Every day | ORAL | 0 refills | Status: DC

## 2023-04-17 NOTE — Progress Notes (Signed)
Established Patient Office Visit  Subjective   Patient ID: Misty [name], female    DOB: September 06, 1955  Age: 67 y.o. MRN: [id number]  Chief Complaint  Patient presents with   Medical Management of Chronic Issues    HPI 67 yo female who is deaf and accompanied by interpreter to follow up on HTN and pre-diabetes.  BP today was elevated. She has not been checking her BP's at home. Pt reports pain in her hands and increased stress and thinks this is why. She sometimes has SOB. She reports HAs, but that is her normal. Denies CP, leg swelling, and SOB.  Patient takes at home blood sugars which averages around 95- 110. Sometimes it is more elevated.   Diet varies. She has chicken, fish, spaghetti, and tacos. She has tried to increase protein. Has not been exercising due to care taking for her husband. Patient has lost 4 lbs since last 4 mo ago.  .. Active Ambulatory Problems    Diagnosis Date Noted   Essential hypertension, benign 12/16/2013   Hyperlipidemia 12/16/2013   Frequent headaches 12/16/2013   Hx of extrinsic asthma 12/16/2013   Pre-diabetes 12/27/2013   Polyp, cervix 09/09/2015   Midline low back pain without sciatica 09/09/2015   Trigger finger, right 05/17/2016   Gluteal tendonitis 07/25/2016   Screening for breast cancer 07/25/2016   Fatigue 12/05/2016   Osteopenia determined by x-ray 12/07/2016   OSA (obstructive sleep apnea) 01/02/2017   Dyspnea 04/13/2017   GERD (gastroesophageal reflux disease) 04/13/2017   Vaginal dryness, menopausal 01/31/2018   Female cystocele 02/12/2019   Constipation 02/12/2019   Female genital prolapse, unspecified 04/15/2020   Stress incontinence 04/15/2020   Primary localized osteoarthrosis of hands, bilateral 07/15/2020   Bilateral hand pain 07/21/2020   Chronic rhinitis 07/21/2020   Reactive airway disease 07/21/2020   Cystocele with prolapse 08/14/2020   Hypokalemia 09/01/2020   Urinary retention 09/01/2020   Status post  hysterectomy 09/01/2020   Elevated blood pressure reading without diagnosis of hypertension 09/01/2020   Chest tightness 09/23/2020   Primary osteoarthritis of right foot 11/24/2020   B12 deficiency 12/22/2020   Vitamin D deficiency 12/22/2020   Brittle nails 12/22/2020   Chronic foot pain, right 06/25/2021   Epigastric discomfort 01/14/2022   Chest pain 01/14/2022   Caregiver stress 10/03/2022   No energy 10/03/2022   Primary osteoarthritis of first carpometacarpal joint of right hand 10/25/2022   Resolved Ambulatory Problems    Diagnosis Date Noted   Osteoarthritis of finger 12/13/2013   Finger pain, right 05/17/2016   Past Medical History:  Diagnosis Date   Allergic rhinitis, seasonal    Anxiety    Complication of anesthesia    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  Respiratory:  Positive for shortness of breath.   Cardiovascular:  Positive for chest pain.  Musculoskeletal:  Positive for joint pain.  Neurological:  Positive for headaches.      Objective:     BP (!) 144/80 (BP Location: Left Arm)   Pulse 82   Ht 5\' 4"  (1.626 m)   Wt 152 lb (68.9  kg)   SpO2 99%   BMI 26.09 kg/m  BP Readings from Last 3 Encounters:  04/17/23 (!) 144/80  12/12/22 137/68  11/14/22 (!) 145/92     Physical Exam Cardiovascular:     Rate and Rhythm: Normal rate and regular rhythm.     Pulses: Normal pulses.     Heart sounds: Normal heart sounds.  Pulmonary:     Effort: Pulmonary effort is normal.     Breath sounds: Normal breath sounds.  Musculoskeletal:        General: No swelling.     Last hemoglobin A1c Lab Results  Component Value Date   HGBA1C 5.6 04/17/2023      The 10-year  ASCVD risk score (Arnett DK, et al., 2019) is: 12.7%    Assessment & Plan:  Marland KitchenMarland KitchenHristina was seen today for medical management of chronic issues.  Diagnoses and all orders for this visit:  Immunization due -     Flu Vaccine Trivalent High Dose (Fluad)  Pre-diabetes -     POCT HgB A1C  Essential hypertension, benign -     losartan (COZAAR) 50 MG tablet; Take 1 tablet (50 mg total) by mouth daily.  Primary localized osteoarthrosis of hands, bilateral    Take at home BPs a few times per week.  BP not at goal of <130/80.  Increase losartan dosage from 25 mg to 50 mg. Advised patient that she can take two of her 25 mg losartan pills until that runs out.  Continue to improve diet and exercise.   Advised patient that she can use Voltaren gel up to 4x/day on hands. Recommended paraffin wax home hand treatments nightly.   Patient received flu vaccine today. Patient declined COVID vaccine.  Follow up in 3 mo for A1c and BP check.  Return in about 3 months (around 07/18/2023) for Follow up.    Tandy Gaw, PA-C

## 2023-04-17 NOTE — Patient Instructions (Signed)
Use voltaren gel 4 times a day on hands Consider paraffin wax treatment nightly Increase cozaar to 50mg  daily  Continue on diabetic diet and regular exercise

## 2023-04-25 ENCOUNTER — Encounter: Payer: Self-pay | Admitting: Physician Assistant

## 2023-04-25 ENCOUNTER — Encounter: Payer: Self-pay | Admitting: Sports Medicine

## 2023-04-25 DIAGNOSIS — M19041 Primary osteoarthritis, right hand: Secondary | ICD-10-CM

## 2023-04-25 MED ORDER — TRAMADOL HCL 50 MG PO TABS: 50.0000 mg | ORAL_TABLET | Freq: Four times a day (QID) | ORAL | 0 refills | Status: AC | PRN

## 2023-04-25 MED ORDER — CELECOXIB 200 MG PO CAPS: ORAL_CAPSULE | ORAL | 2 refills | Status: DC

## 2023-04-26 ENCOUNTER — Other Ambulatory Visit: Payer: Self-pay

## 2023-04-26 ENCOUNTER — Encounter: Payer: Self-pay | Admitting: Physician Assistant

## 2023-04-26 DIAGNOSIS — Z636 Dependent relative needing care at home: Secondary | ICD-10-CM

## 2023-04-26 DIAGNOSIS — R5383 Other fatigue: Secondary | ICD-10-CM

## 2023-04-26 MED ORDER — BUPROPION HCL ER (XL) 150 MG PO TB24: 150.0000 mg | ORAL_TABLET | Freq: Every morning | ORAL | 0 refills | Status: DC

## 2023-05-22 ENCOUNTER — Encounter: Payer: Self-pay | Admitting: Family Medicine

## 2023-05-22 ENCOUNTER — Ambulatory Visit (INDEPENDENT_AMBULATORY_CARE_PROVIDER_SITE_OTHER): Payer: Medicare Other | Admitting: Family Medicine

## 2023-05-22 VITALS — Ht 64.0 in | Wt 152.0 lb

## 2023-05-22 DIAGNOSIS — Z0000 Encounter for general adult medical examination without abnormal findings: Secondary | ICD-10-CM | POA: Diagnosis not present

## 2023-05-22 NOTE — Progress Notes (Signed)
Subjective:   Misty [name] is a 67 y.o. female who presents for Medicare Annual (Subsequent) preventive examination.  Visit Complete: Virtual I connected with  [name] on 05/22/23 by a audio enabled telemedicine application and verified that I am speaking with the correct person using two identifiers.  Patient Location: Home  Provider Location: Office/Clinic  I discussed the limitations of evaluation and management by telemedicine. The patient expressed understanding and agreed to proceed.  Vital Signs: Because this visit was a virtual/telehealth visit, some criteria may be missing or patient reported. Any vitals not documented were not able to be obtained and vitals that have been documented are patient reported.  Patient Medicare AWV questionnaire was completed by the patient on n/a ; I have confirmed that all information answered by patient is correct and no changes since this date.  Cardiac Risk Factors include: advanced age (>33men, >30 women);diabetes mellitus;dyslipidemia;hypertension     Objective:    Today's Vitals   05/22/23 1552  Weight: 152 lb (68.9 kg)  Height: 5\' 4"  (1.626 m)   Body mass index is 26.09 kg/m.     05/22/2023    4:14 PM 05/18/2022   11:05 AM 05/17/2021    9:11 AM 10/01/2020    8:11 AM 08/19/2020    7:32 PM 08/15/2020   12:54 AM 08/14/2020    5:51 AM  Advanced Directives  Does Patient Have a Medical Advance Directive? Yes Yes Yes Yes Yes Yes Yes  Type of Advance Directive Living will;Healthcare Power of Attorney Living will Living will;Healthcare Power of [employer] Power of [place];Living will  Living will;Healthcare Power of Attorney Living will;Healthcare Power of Attorney  Does patient want to make changes to medical advance directive? No - Patient declined No - Patient declined No - Patient declined No - Patient declined  No - Patient declined   Copy of Healthcare Power of Attorney in Chart?   No - copy requested No - copy  requested  No - copy requested No - copy requested    Current Medications (verified) Outpatient Encounter Medications as of 05/22/2023  Medication Sig   acetaminophen (TYLENOL) 500 MG tablet Take 1,000 mg by mouth every 6 (six) hours as needed for moderate pain or headache.   AMBULATORY NON FORMULARY MEDICATION Continuous positive airway pressure (CPAP) machine auto-titrate from 4-20 cm of H2O pressure, with all supplemental supplies as needed.   Biotin 10 MG CAPS Take by mouth.   buPROPion (WELLBUTRIN XL) 150 MG 24 hr tablet Take 1 tablet (150 mg total) by mouth in the morning.   calcium carbonate (TUMS - DOSED IN MG ELEMENTAL CALCIUM) 500 MG chewable tablet Chew 1,000-1,500 mg by mouth daily as needed for indigestion or heartburn.   celecoxib (CELEBREX) 200 MG capsule TAKE 1 TO 2 CAPSULES BY MOUTH DAILY AS NEEDED FOR PAIN.   diclofenac Sodium (VOLTAREN) 1 % GEL Apply 1 application topically 4 (four) times daily as needed (pain).   fluticasone (FLONASE) 50 MCG/ACT nasal spray PLACE 2 SPRAYS INTO BOTH NOSTRILS DAILY AS NEEDED FOR ALLERGIES.   loratadine (CLARITIN) 10 MG tablet Take 10 mg by mouth daily.   losartan (COZAAR) 50 MG tablet Take 1 tablet (50 mg total) by mouth daily.   magnesium oxide (MAG-OX) 400 MG tablet Take 400 mg by mouth at bedtime.   metFORMIN (GLUCOPHAGE) 1000 MG tablet Take 1 tablet (1,000 mg total) by mouth 2 (two) times daily with a meal.   omeprazole (PRILOSEC) 40 MG capsule Take 1 capsule (40  mg total) by mouth daily.   Polyethylene Glycol 3350 (MIRALAX PO) Take by mouth as needed.   Probiotic Product (PROBIOTIC PO) Take 1 capsule by mouth daily.   Simethicone (GAS-X PO) Take 2 tablets by mouth daily as needed (gas).   simvastatin (ZOCOR) 40 MG tablet TAKE 1 TABLET BY MOUTH DAILY AT 6 PM.   TURMERIC PO Take 1 capsule by mouth 2 (two) times daily.   albuterol (PROAIR HFA) 108 (90 Base) MCG/ACT inhaler TAKE 2 PUFFS BY MOUTH EVERY 6 HOURS AS NEEDED FOR WHEEZE OR  SHORTNESS OF BREATH (Patient not taking: Reported on 05/22/2023)   losartan (COZAAR) 25 MG tablet TAKE 1 TABLET (25 MG TOTAL) BY MOUTH DAILY. (Patient not taking: Reported on 05/22/2023)   sennosides-docusate sodium (SENOKOT-S) 8.6-50 MG tablet Take 2 tablets by mouth every 14 (fourteen) days. (Patient not taking: Reported on 05/22/2023)   No facility-administered encounter medications on file as of 05/22/2023.    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    per pt at age 19 months red measles with high fever;   needs american sign language interperter   Essential hypertension, benign 12/16/2013   Essential hypertension, benign 12/16/2013   Headache    History of kidney stones    Hyperlipidemia    Mild intermittent asthma    followed by pcp  (previously seen by dr Kendrick Fries, pulmonology)   OA (osteoarthritis)    OSA on CPAP    per pt uses nightly,  moderate osa per study in epic 12-27-2016   Osteopenia determined by x-ray 12/07/2016   DEXA scan July 2018   PONV (postoperative nausea and vomiting)    Pre-diabetes    Screening for breast cancer 07/25/2016   Screening for breast cancer 07/25/2016   Self-catheterizes urinary bladder    psot op sling procedure on 08-14-2020   Sleep apnea    Urinary retention    post op sling procedure on 08-14-2020, self cathing   Wears glasses    Past Surgical History:  Procedure Laterality Date   ABDOMINAL HYSTERECTOMY  08/14/2020   bilateral foot surgery      left hand surgery     for arthritis    PUBOVAGINAL SLING N/A 08/14/2020   Procedure: Leonides Grills;  Surgeon: Crist Fat, MD;  Location: [facility];  Service: Urology;  Laterality: N/A;   PUBOVAGINAL SLING N/A 10/01/2020   Procedure: URETHRAL LYSIS URETHRAL SLING TAKE DOWN with CYSTOSCOPY;   Surgeon: Crist Fat, MD;  Location: [facility];  Service: Urology;  Laterality: N/A;   ROBOTIC ASSISTED LAPAROSCOPIC HYSTERECTOMY AND SALPINGECTOMY Bilateral 08/14/2020   Procedure: XI ROBOTIC SUPRACERVOCAL HYSTERECTOMY AND BILATERAL SALPINGO OOPHORECTOMY;  Surgeon: Crist Fat, MD;  Location: [facility];  Service: Urology;  Laterality: Bilateral;   ROBOTIC ASSISTED LAPAROSCOPIC SACROCOLPOPEXY N/A 08/14/2020   Procedure: XI ROBOTIC  SACROCOLPOPEXY WITH CYSTOSCOPY;  Surgeon: Crist Fat, MD;  Location: [facility];  Service: Urology;  Laterality: N/A;   Family History  Problem Relation Age of Onset   Hyperlipidemia Mother    Cancer Father        prostate   Diabetes Father    Arthritis Father    Cancer Maternal Grandfather        colon cancer   Stroke Maternal Grandfather  Breast cancer Sister    Cancer Maternal Grandmother    Cancer Paternal Grandfather    Social History   Socioeconomic History   Marital status: Married    Spouse name: Alishah Logemann   Number of children: 3   Years of education: 14   Highest education level: Some college, no degree  Occupational History   Occupation: Retired.  Tobacco Use   Smoking status: Never   Smokeless tobacco: Never  Vaping Use   Vaping status: Never Used  Substance and Sexual Activity   Alcohol use: Never   Drug use: Never   Sexual activity: Not Currently    Birth control/protection: Post-menopausal  Other Topics Concern   Not on file  Social History Narrative   Lives with her husband, son and daughter-in-law. . She has three children. She enjoys sewing and digital puzzles.   Social Drivers of Corporate investment banker Strain: Low Risk  (05/22/2023)   Overall Financial Resource Strain (CARDIA)    Difficulty of Paying Living Expenses: Not hard at all  Food Insecurity: No Food Insecurity (05/22/2023)   Hunger Vital Sign    Worried About Running Out of Food in the Last Year: Never true    Ran  Out of Food in the Last Year: Never true  Transportation Needs: No Transportation Needs (05/22/2023)   PRAPARE - Administrator, Civil Service (Medical): No    Lack of Transportation (Non-Medical): No  Physical Activity: Insufficiently Active (05/22/2023)   Exercise Vital Sign    Days of Exercise per Week: 3 days    Minutes of Exercise per Session: 30 min  Stress: No Stress Concern Present (05/22/2023)   [employer] of Occupational Health - Occupational Stress Questionnaire    Feeling of Stress : Only a little  Social Connections: Socially Integrated (05/22/2023)   Social Connection and Isolation Panel [NHANES]    Frequency of Communication with Friends and Family: Three times a week    Frequency of Social Gatherings with Friends and Family: Once a week    Attends Religious Services: More than 4 times per year    Active Member of [employer] or Organizations: Yes    Attends Engineer, structural: More than 4 times per year    Marital Status: Married    Tobacco Counseling Counseling given: Not Answered   Clinical Intake:  Pre-visit preparation completed: No  Pain : No/denies pain     BMI - recorded: 26 Nutritional Status: BMI 25 -29 Overweight Nutritional Risks: None Diabetes: Yes (pre-diabetes) CBG done?: No Did pt. bring in CBG monitor from home?: No  How often do you need to have someone help you when you read instructions, pamphlets, or other written materials from your doctor or pharmacy?: 1 - Never What is the last grade level you completed in school?: 14  Interpreter Needed?: Yes Interpreter Agency: Administrator, arts- ASL Interpreter ID: [id number]      Activities of Daily Living    05/22/2023    3:57 PM 05/21/2023    8:34 PM  In your present state of health, do you have any difficulty performing the following activities:  Hearing? 1 1  Vision? 0 1  Comment wears glasses   Difficulty concentrating or making decisions? 0 0  Walking or  climbing stairs? 0 0  Dressing or bathing? 0 0  Doing errands, shopping? 0 0  Preparing Food and eating ? N N  Using the Toilet? N N  In the past six months, have  you accidently leaked urine? N N  Do you have problems with loss of bowel control? N N  Managing your Medications? N N  Managing your Finances? N N  Housekeeping or managing your Housekeeping? N N    Patient Care Team: Nolene Ebbs as PCP - General (Family Medicine) Rodney Langton, sports medicine  Jeananne Rama, Optometry    Indicate any recent Medical Services you may have received from other than [facility] providers in the past year (date may be approximate).     Assessment:   This is a routine wellness examination for Isaly.  Hearing/Vision screen Hearing Screening - Comments:: Deaf.  Vision Screening - Comments:: Unable to test, wears glasses at times. Needs for reading.    Goals Addressed             This Visit's Progress    DIET - INCREASE WATER INTAKE       Eat healthier diet.       Depression Screen    05/22/2023    4:12 PM 12/12/2022    3:08 PM 05/18/2022   11:05 AM 01/14/2022    2:39 PM 05/17/2021    9:12 AM 12/21/2020    1:40 PM 07/13/2020    2:54 PM  PHQ 2/9 Scores  PHQ - 2 Score 0 0 0 2 0 0 2  PHQ- 9 Score    5   6    Fall Risk    05/22/2023    4:15 PM 05/21/2023    8:34 PM 12/12/2022    3:07 PM 05/18/2022   11:05 AM 05/17/2022   11:20 AM  Fall Risk   Falls in the past year? 0 0 0 0 0  Number falls in past yr: 0  0 0   Injury with Fall? 0 0 0 0   Risk for fall due to : No Fall Risks  No Fall Risks No Fall Risks   Follow up   Falls evaluation completed Falls evaluation completed     MEDICARE RISK AT HOME: Medicare Risk at Home Any stairs in or around the home?: Yes If so, are there any without handrails?: Yes Home free of loose throw rugs in walkways, pet beds, electrical cords, etc?: Yes Adequate lighting in your home to reduce risk of falls?: Yes Life  alert?: No Use of a cane, walker or w/c?: (Patient-Rptd) No Grab bars in the bathroom?: No Shower chair or bench in shower?: Yes Elevated toilet seat or a handicapped toilet?: No  TIMED UP AND GO:  Was the test performed?  No    Cognitive Function:        05/22/2023    4:16 PM 05/18/2022   11:09 AM 05/17/2021    9:24 AM  6CIT Screen  What Year? 0 points 0 points 0 points  What month? 0 points 0 points 0 points  What time? 0 points 0 points 0 points  Count back from 20 0 points 0 points 0 points  Months in reverse 0 points 0 points 0 points  Repeat phrase 0 points 0 points 0 points  Total Score 0 points 0 points 0 points    Immunizations Immunization History  Administered Date(s) Administered   Fluad Quad(high Dose 65+) 03/23/2020, 02/16/2021, 03/05/2022   Fluad Trivalent(High Dose 65+) 04/17/2023   Influenza,inj,Quad PF,6+ Mos 03/06/2014, 03/02/2017, 01/31/2018, 01/24/2019   Influenza-Unspecified 04/28/2016   PFIZER(Purple Top)SARS-COV-2 Vaccination 08/09/2019, 08/30/2019, 04/09/2020   Pfizer Covid-19 Vaccine Bivalent Booster 33yrs & up 03/11/2021   Pneumococcal Conjugate-13  07/13/2020   Pneumococcal Polysaccharide-23 06/23/2021   Tdap 07/08/2010, 11/01/2021   Zoster Recombinant(Shingrix) 01/26/2022, 04/14/2022   Zoster, Live 04/18/2011    TDAP status: Up to date  Flu Vaccine status: Up to date  Pneumococcal vaccine status: Up to date  Covid-19 vaccine status: Declined, Education has been provided regarding the importance of this vaccine but patient still declined. Advised may receive this vaccine at local pharmacy or Health Dept.or vaccine clinic. Aware to provide a copy of the vaccination record if obtained from local pharmacy or Health Dept. Verbalized acceptance and understanding.  Qualifies for Shingles Vaccine? Yes   Zostavax completed Yes   Shingrix Completed?: Yes  Screening Tests Health Maintenance  Topic Date Due   COVID-19 Vaccine (5 - 2024-25  season) 06/07/2023 (Originally 01/29/2023)   Medicare Annual Wellness (AWV)  05/21/2024   MAMMOGRAM  12/27/2024   Fecal DNA (Cologuard)  06/13/2025   DTaP/Tdap/Td (3 - Td or Tdap) 11/02/2031   Pneumonia Vaccine 61+ Years old  Completed   INFLUENZA VACCINE  Completed   DEXA SCAN  Completed   Hepatitis C Screening  Completed   Zoster Vaccines- Shingrix  Completed   HPV VACCINES  Aged Out   Colonoscopy  Discontinued    Health Maintenance  There are no preventive care reminders to display for this patient.   Colorectal cancer screening: Cologuard done 06/13/22. Repeat in 3 years.   Mammogram status: Completed 12/28/22. Repeat every year  Bone Density status: Completed 08/05/20. Results reflect: Bone density results: OSTEOPENIA. Repeat every 2 years.  Lung Cancer Screening: (Low Dose CT Chest recommended if Age 57-80 years, 20 pack-year currently smoking OR have quit w/in 15years.) does not qualify.   Lung Cancer Screening Referral: n/a   Additional Screening:  Hepatitis C Screening: does qualify; Completed 09/09/15  Vision Screening: Recommended annual ophthalmology exams for early detection of glaucoma and other disorders of the eye. Is the patient up to date with their annual eye exam?  Yes  Who is the provider or what is the name of the office in which the patient attends annual eye exams? Dr. Weston Anna  If pt is not established with a provider, would they like to be referred to a provider to establish care? Yes .   Dental Screening: Recommended annual dental exams for proper oral hygiene  Diabetic Foot Exam: n/a   Community Resource Referral / Chronic Care Management: CRR required this visit?  No   CCM required this visit?  No     Plan:     I have personally reviewed and noted the following in the patient's chart:   Medical and social history Use of alcohol, tobacco or illicit drugs  Current medications and supplements including opioid prescriptions. Patient is not  currently taking opioid prescriptions. Functional ability and status Nutritional status Physical activity Advanced directives List of other physicians Hospitalizations, surgeries, and ER visits in previous 12 months: none  Vitals Screenings to include cognitive, depression, and falls Referrals and appointments  In addition, I have reviewed and discussed with patient certain preventive protocols, quality metrics, and best practice recommendations. A written personalized care plan for preventive services as well as general preventive health recommendations were provided to patient.     Novella Olive, FNP   05/22/2023   After Visit Summary: (MyChart) Due to this being a telephonic visit, the after visit summary with patients personalized plan was offered to patient via MyChart   Follow-up with PCP as scheduled. Declines need for Covid vaccine.

## 2023-06-29 ENCOUNTER — Other Ambulatory Visit: Payer: Self-pay | Admitting: Physician Assistant

## 2023-06-29 DIAGNOSIS — E782 Mixed hyperlipidemia: Secondary | ICD-10-CM

## 2023-07-08 ENCOUNTER — Other Ambulatory Visit: Payer: Self-pay | Admitting: Physician Assistant

## 2023-07-13 ENCOUNTER — Other Ambulatory Visit: Payer: Self-pay | Admitting: Physician Assistant

## 2023-07-13 DIAGNOSIS — I10 Essential (primary) hypertension: Secondary | ICD-10-CM

## 2023-07-14 ENCOUNTER — Encounter: Payer: Self-pay | Admitting: Student

## 2023-07-18 ENCOUNTER — Other Ambulatory Visit: Payer: Self-pay | Admitting: Sports Medicine

## 2023-07-18 DIAGNOSIS — M19041 Primary osteoarthritis, right hand: Secondary | ICD-10-CM

## 2023-07-21 ENCOUNTER — Ambulatory Visit: Payer: Medicare Other | Admitting: Physician Assistant

## 2023-07-25 ENCOUNTER — Other Ambulatory Visit: Payer: Self-pay | Admitting: Physician Assistant

## 2023-07-25 DIAGNOSIS — Z636 Dependent relative needing care at home: Secondary | ICD-10-CM

## 2023-07-25 DIAGNOSIS — R5383 Other fatigue: Secondary | ICD-10-CM

## 2023-07-27 ENCOUNTER — Other Ambulatory Visit: Payer: Self-pay | Admitting: Physician Assistant

## 2023-07-27 DIAGNOSIS — Z636 Dependent relative needing care at home: Secondary | ICD-10-CM

## 2023-07-27 DIAGNOSIS — R5383 Other fatigue: Secondary | ICD-10-CM

## 2023-08-04 DIAGNOSIS — Z23 Encounter for immunization: Secondary | ICD-10-CM | POA: Diagnosis not present

## 2023-08-24 ENCOUNTER — Other Ambulatory Visit: Payer: Self-pay | Admitting: Physician Assistant

## 2023-08-24 DIAGNOSIS — R1013 Epigastric pain: Secondary | ICD-10-CM

## 2023-08-24 DIAGNOSIS — R079 Chest pain, unspecified: Secondary | ICD-10-CM

## 2023-10-12 ENCOUNTER — Other Ambulatory Visit: Payer: Self-pay | Admitting: Physician Assistant

## 2023-10-12 DIAGNOSIS — I10 Essential (primary) hypertension: Secondary | ICD-10-CM

## 2023-10-22 ENCOUNTER — Other Ambulatory Visit: Payer: Self-pay | Admitting: Sports Medicine

## 2023-10-22 ENCOUNTER — Other Ambulatory Visit: Payer: Self-pay | Admitting: Physician Assistant

## 2023-10-22 DIAGNOSIS — R5383 Other fatigue: Secondary | ICD-10-CM

## 2023-10-22 DIAGNOSIS — M19041 Primary osteoarthritis, right hand: Secondary | ICD-10-CM

## 2023-10-22 DIAGNOSIS — Z636 Dependent relative needing care at home: Secondary | ICD-10-CM

## 2023-11-19 ENCOUNTER — Other Ambulatory Visit: Payer: Self-pay | Admitting: Physician Assistant

## 2023-11-19 DIAGNOSIS — Z636 Dependent relative needing care at home: Secondary | ICD-10-CM

## 2023-11-19 DIAGNOSIS — R5383 Other fatigue: Secondary | ICD-10-CM

## 2023-11-20 NOTE — Telephone Encounter (Signed)
 Please advise on refill request

## 2023-12-18 ENCOUNTER — Other Ambulatory Visit (INDEPENDENT_AMBULATORY_CARE_PROVIDER_SITE_OTHER): Payer: Self-pay

## 2023-12-18 ENCOUNTER — Ambulatory Visit (INDEPENDENT_AMBULATORY_CARE_PROVIDER_SITE_OTHER): Admitting: Sports Medicine

## 2023-12-18 ENCOUNTER — Encounter: Payer: Self-pay | Admitting: Sports Medicine

## 2023-12-18 DIAGNOSIS — M1811 Unilateral primary osteoarthritis of first carpometacarpal joint, right hand: Secondary | ICD-10-CM

## 2023-12-18 MED ORDER — TRAMADOL HCL 50 MG PO TABS: 50.0000 mg | ORAL_TABLET | Freq: Three times a day (TID) | ORAL | 0 refills | Status: AC | PRN

## 2023-12-18 MED ORDER — TRIAMCINOLONE ACETONIDE 40 MG/ML IJ SUSP
20.0000 mg | Freq: Once | INTRAMUSCULAR | Status: AC
  Administered 2023-12-18: 20 mg via INTRA_ARTICULAR

## 2023-12-18 NOTE — Addendum Note (Signed)
 Addended by: CURTIS DEBBY PARAS on: 12/18/2023 04:32 PM   Modules accepted: Orders

## 2023-12-18 NOTE — Progress Notes (Addendum)
    Procedures performed today:    Procedure: Real-time Ultrasound Guided injection of the right first [facility] Device: Samsung HS60  Verbal informed consent obtained.  Time-out conducted.  Noted no overlying erythema, induration, or other signs of local infection.  Skin prepped in a sterile fashion.  Local anesthesia: Topical Ethyl chloride.  With sterile technique and under real time ultrasound guidance: Arthritic joint noted, synovitis noted, 0.5 cc lidocaine , 0.5 cc kenalog  40 injected easily. Completed without difficulty  Advised to call if fevers/chills, erythema, induration, drainage, or persistent bleeding.  Images permanently stored and available for review in PACS.  Impression: Technically successful ultrasound guided injection.  Independent interpretation of notes and tests performed by another provider:   None.  Brief History, Exam, Impression, and Recommendations:    Primary osteoarthritis of first carpometacarpal joint of right hand Known first [facility] osteoarthritis last injection was May 2024 now with recurrence of pain, she also has some pain in the 2nd and 5th PIP joints. Exam does reveal significant swelling and tenderness. We did a for [facility] injection, if she does not have good relief of all the joints in her hand by next week we can do the 2 other fingers as well. In the meantime we will add tramadol .    ____________________________________________ Debby PARAS. Curtis, M.D., ABFM., CAQSM., AME. Primary Care and Sports Medicine [place]  Adjunct Professor of [facility] Medicine  [employer]   [employer]  Restaurant manager, fast food

## 2023-12-18 NOTE — Assessment & Plan Note (Addendum)
 Known first [facility] osteoarthritis last injection was May 2024 now with recurrence of pain, she also has some pain in the 2nd and 5th PIP joints. Exam does reveal significant swelling and tenderness. We did a for [facility] injection, if she does not have good relief of all the joints in her hand by next week we can do the 2 other fingers as well. In the meantime we will add tramadol .

## 2023-12-21 ENCOUNTER — Other Ambulatory Visit: Payer: Self-pay | Admitting: Physician Assistant

## 2023-12-21 DIAGNOSIS — E782 Mixed hyperlipidemia: Secondary | ICD-10-CM

## 2023-12-26 ENCOUNTER — Ambulatory Visit (INDEPENDENT_AMBULATORY_CARE_PROVIDER_SITE_OTHER): Admitting: Sports Medicine

## 2023-12-26 DIAGNOSIS — M19041 Primary osteoarthritis, right hand: Secondary | ICD-10-CM | POA: Diagnosis not present

## 2023-12-26 DIAGNOSIS — M19042 Primary osteoarthritis, left hand: Secondary | ICD-10-CM | POA: Diagnosis not present

## 2023-12-26 DIAGNOSIS — N3000 Acute cystitis without hematuria: Secondary | ICD-10-CM | POA: Insufficient documentation

## 2023-12-26 DIAGNOSIS — M1811 Unilateral primary osteoarthritis of first carpometacarpal joint, right hand: Secondary | ICD-10-CM | POA: Diagnosis not present

## 2023-12-26 MED ORDER — TRIAMCINOLONE ACETONIDE 40 MG/ML IJ SUSP
40.0000 mg | Freq: Once | INTRAMUSCULAR | Status: AC
  Administered 2023-12-26: 40 mg via INTRA_ARTICULAR

## 2023-12-26 MED ORDER — NITROFURANTOIN MONOHYD MACRO 100 MG PO CAPS: 100.0000 mg | ORAL_CAPSULE | Freq: Two times a day (BID) | ORAL | 0 refills | Status: AC

## 2023-12-26 NOTE — Progress Notes (Signed)
    Procedures performed today:    Procedure:  Injection of right second finger PIP Consent obtained and verified. Time-out conducted. Noted no overlying erythema, induration, or other signs of local infection. Skin prepped in a sterile fashion. Topical analgesic spray: Ethyl chloride. Completed without difficulty. Meds: 0.5 cc lidocaine , 0.5 cc kenalog  40 injected easily. Advised to call if fevers/chills, erythema, induration, drainage, or persistent bleeding.  Procedure:  Injection of right fifth finger PIP Consent obtained and verified. Time-out conducted. Noted no overlying erythema, induration, or other signs of local infection. Skin prepped in a sterile fashion. Topical analgesic spray: Ethyl chloride. Completed without difficulty. Meds: 0.5 cc lidocaine , 0.5 cc kenalog  40 injected easily. Advised to call if fevers/chills, erythema, induration, drainage, or persistent bleeding.  Independent interpretation of notes and tests performed by another provider:   None.  Brief History, Exam, Impression, and Recommendations:    Primary localized osteoarthrosis of hands, bilateral Persistent pain right 2nd and 5th PIPs, injected today.   Primary osteoarthritis of first carpometacarpal joint of right hand Doing well post injection.  Acute cystitis Patient did a home dipstick, tells us  it was positive, she does have dysuria. She is unable to void again so that we can get a specimen for culture, we will go ahead and treat empirically with Macrobid .    ____________________________________________ Misty PARAS. [name], M.D., ABFM., CAQSM., AME. Primary Care and Sports Medicine [place] [facility]  Adjunct Professor of [facility] Medicine  [employer]  Restaurant manager, fast food

## 2023-12-26 NOTE — Assessment & Plan Note (Signed)
 Patient did a home dipstick, tells us  it was positive, she does have dysuria. She is unable to void again so that we can get a specimen for culture, we will go ahead and treat empirically with Macrobid .

## 2023-12-26 NOTE — Addendum Note (Signed)
 Addended by: OLEY CHIQUITA CROME on: 12/26/2023 04:19 PM   Modules accepted: Orders

## 2023-12-26 NOTE — Assessment & Plan Note (Signed)
 Doing well post injection.

## 2023-12-26 NOTE — Assessment & Plan Note (Signed)
 Persistent pain right 2nd and 5th PIPs, injected today.

## 2024-01-05 ENCOUNTER — Other Ambulatory Visit: Payer: Self-pay | Admitting: Physician Assistant

## 2024-01-10 ENCOUNTER — Other Ambulatory Visit: Payer: Self-pay | Admitting: Family Medicine

## 2024-01-10 DIAGNOSIS — I10 Essential (primary) hypertension: Secondary | ICD-10-CM

## 2024-01-30 ENCOUNTER — Encounter: Payer: Self-pay | Admitting: Sports Medicine

## 2024-02-06 ENCOUNTER — Ambulatory Visit: Admitting: Sports Medicine

## 2024-02-11 ENCOUNTER — Other Ambulatory Visit: Payer: Self-pay | Admitting: Physician Assistant

## 2024-02-11 DIAGNOSIS — R079 Chest pain, unspecified: Secondary | ICD-10-CM

## 2024-02-11 DIAGNOSIS — R1013 Epigastric pain: Secondary | ICD-10-CM

## 2024-02-26 DIAGNOSIS — Z23 Encounter for immunization: Secondary | ICD-10-CM | POA: Diagnosis not present

## 2024-04-07 ENCOUNTER — Other Ambulatory Visit: Payer: Self-pay | Admitting: Physician Assistant

## 2024-04-07 DIAGNOSIS — I10 Essential (primary) hypertension: Secondary | ICD-10-CM

## 2024-05-14 ENCOUNTER — Other Ambulatory Visit: Payer: Self-pay | Admitting: Physician Assistant

## 2024-05-14 DIAGNOSIS — Z636 Dependent relative needing care at home: Secondary | ICD-10-CM

## 2024-05-14 DIAGNOSIS — R5383 Other fatigue: Secondary | ICD-10-CM

## 2024-06-07 ENCOUNTER — Other Ambulatory Visit: Payer: Self-pay | Admitting: Physician Assistant

## 2024-06-07 DIAGNOSIS — R5383 Other fatigue: Secondary | ICD-10-CM

## 2024-06-07 DIAGNOSIS — E782 Mixed hyperlipidemia: Secondary | ICD-10-CM

## 2024-06-07 DIAGNOSIS — Z636 Dependent relative needing care at home: Secondary | ICD-10-CM

## 2024-06-13 ENCOUNTER — Other Ambulatory Visit: Payer: Self-pay | Admitting: Physician Assistant
